# Patient Record
Sex: Female | Born: 1980 | Race: Black or African American | Hispanic: No | Smoking: Never smoker
Health system: Southern US, Community
[De-identification: ages and names within clinical notes are randomized; demographics above are authoritative.]

## PROBLEM LIST (undated history)

## (undated) DIAGNOSIS — F419 Anxiety disorder, unspecified: Secondary | ICD-10-CM

## (undated) DIAGNOSIS — M1990 Unspecified osteoarthritis, unspecified site: Secondary | ICD-10-CM

## (undated) DIAGNOSIS — T7840XA Allergy, unspecified, initial encounter: Secondary | ICD-10-CM

## (undated) DIAGNOSIS — F32A Depression, unspecified: Secondary | ICD-10-CM

## (undated) DIAGNOSIS — R5383 Other fatigue: Secondary | ICD-10-CM

## (undated) DIAGNOSIS — J302 Other seasonal allergic rhinitis: Secondary | ICD-10-CM

## (undated) DIAGNOSIS — I10 Essential (primary) hypertension: Secondary | ICD-10-CM

## (undated) DIAGNOSIS — R002 Palpitations: Secondary | ICD-10-CM

## (undated) DIAGNOSIS — R7303 Prediabetes: Secondary | ICD-10-CM

## (undated) DIAGNOSIS — M2550 Pain in unspecified joint: Secondary | ICD-10-CM

## (undated) DIAGNOSIS — E559 Vitamin D deficiency, unspecified: Secondary | ICD-10-CM

## (undated) DIAGNOSIS — M797 Fibromyalgia: Secondary | ICD-10-CM

## (undated) DIAGNOSIS — K5900 Constipation, unspecified: Secondary | ICD-10-CM

## (undated) DIAGNOSIS — M7989 Other specified soft tissue disorders: Secondary | ICD-10-CM

## (undated) DIAGNOSIS — R0602 Shortness of breath: Secondary | ICD-10-CM

## (undated) DIAGNOSIS — J45909 Unspecified asthma, uncomplicated: Secondary | ICD-10-CM

## (undated) DIAGNOSIS — Z91018 Allergy to other foods: Secondary | ICD-10-CM

## (undated) HISTORY — DX: Pain in unspecified joint: M25.50

## (undated) HISTORY — DX: Shortness of breath: R06.02

## (undated) HISTORY — DX: Other specified soft tissue disorders: M79.89

## (undated) HISTORY — DX: Essential (primary) hypertension: I10

## (undated) HISTORY — DX: Vitamin D deficiency, unspecified: E55.9

## (undated) HISTORY — DX: Constipation, unspecified: K59.00

## (undated) HISTORY — DX: Unspecified asthma, uncomplicated: J45.909

## (undated) HISTORY — DX: Palpitations: R00.2

## (undated) HISTORY — DX: Allergy to other foods: Z91.018

## (undated) HISTORY — DX: Other seasonal allergic rhinitis: J30.2

## (undated) HISTORY — DX: Prediabetes: R73.03

## (undated) HISTORY — DX: Anxiety disorder, unspecified: F41.9

## (undated) HISTORY — DX: Other fatigue: R53.83

## (undated) HISTORY — DX: Allergy, unspecified, initial encounter: T78.40XA

## (undated) HISTORY — DX: Unspecified osteoarthritis, unspecified site: M19.90

## (undated) HISTORY — DX: Fibromyalgia: M79.7

## (undated) HISTORY — DX: Depression, unspecified: F32.A

---

## 2003-09-03 ENCOUNTER — Encounter: Payer: Self-pay | Admitting: Emergency Medicine

## 2003-09-03 ENCOUNTER — Emergency Department (HOSPITAL_COMMUNITY): Admission: EM | Admit: 2003-09-03 | Discharge: 2003-09-03 | Payer: Self-pay | Admitting: Physical Therapy

## 2004-02-27 ENCOUNTER — Emergency Department (HOSPITAL_COMMUNITY): Admission: EM | Admit: 2004-02-27 | Discharge: 2004-02-27 | Payer: Self-pay | Admitting: Emergency Medicine

## 2005-11-19 DIAGNOSIS — M797 Fibromyalgia: Secondary | ICD-10-CM

## 2005-11-19 HISTORY — DX: Fibromyalgia: M79.7

## 2009-07-31 ENCOUNTER — Emergency Department (HOSPITAL_COMMUNITY): Admission: EM | Admit: 2009-07-31 | Discharge: 2009-07-31 | Payer: Self-pay | Admitting: Emergency Medicine

## 2011-02-23 LAB — URINALYSIS, ROUTINE W REFLEX MICROSCOPIC
Glucose, UA: NEGATIVE mg/dL
Hgb urine dipstick: NEGATIVE
Ketones, ur: 15 mg/dL — AB
Nitrite: NEGATIVE
Protein, ur: NEGATIVE mg/dL
Specific Gravity, Urine: >1.03 — ABNORMAL HIGH (ref 1.005–1.030)
Urobilinogen, UA: 0.2 mg/dL (ref 0.0–1.0)
pH: 5.5 (ref 5.0–8.0)

## 2011-02-23 LAB — BASIC METABOLIC PANEL
BUN: 10 mg/dL (ref 6–23)
CO2: 27 mEq/L (ref 19–32)
Calcium: 8.6 mg/dL (ref 8.4–10.5)
Chloride: 102 mEq/L (ref 96–112)
Creatinine, Ser: 0.82 mg/dL (ref 0.4–1.2)
GFR calc Af Amer: >60 mL/min (ref >60)
GFR calc non Af Amer: >60 mL/min (ref >60)
Glucose, Bld: 115 mg/dL — ABNORMAL HIGH (ref 70–99)
Potassium: 3.8 mEq/L (ref 3.5–5.1)
Sodium: 136 mEq/L (ref 135–145)

## 2011-02-23 LAB — DIFFERENTIAL
Basophils Absolute: 0 10*3/uL (ref 0.0–0.1)
Basophils Relative: 0 % (ref 0–1)
Eosinophils Absolute: 0 10*3/uL (ref 0.0–0.7)
Monocytes Absolute: 0.4 10*3/uL (ref 0.1–1.0)
Monocytes Relative: 7 % (ref 3–12)
Neutrophils Relative %: 72 % (ref 43–77)

## 2011-02-23 LAB — CBC
MCHC: 34 g/dL (ref 30.0–36.0)
MCV: 92 fL (ref 78.0–100.0)
RBC: 4.42 MIL/uL (ref 3.87–5.11)
RDW: 13.5 % (ref 11.5–15.5)

## 2011-02-23 LAB — PREGNANCY, URINE: Preg Test, Ur: NEGATIVE

## 2013-12-28 ENCOUNTER — Encounter: Payer: Self-pay | Admitting: *Deleted

## 2014-01-13 ENCOUNTER — Encounter: Payer: Self-pay | Admitting: Family Medicine

## 2014-01-13 ENCOUNTER — Ambulatory Visit (INDEPENDENT_AMBULATORY_CARE_PROVIDER_SITE_OTHER): Payer: BC Managed Care – PPO | Admitting: Family Medicine

## 2014-01-13 VITALS — BP 136/82 | HR 66 | Temp 98.2°F | Resp 20 | Ht 67.25 in | Wt 319.0 lb

## 2014-01-13 DIAGNOSIS — I10 Essential (primary) hypertension: Secondary | ICD-10-CM | POA: Insufficient documentation

## 2014-01-13 DIAGNOSIS — Z1321 Encounter for screening for nutritional disorder: Secondary | ICD-10-CM

## 2014-01-13 DIAGNOSIS — Z130 Encounter for screening for diseases of the blood and blood-forming organs and certain disorders involving the immune mechanism: Secondary | ICD-10-CM

## 2014-01-13 DIAGNOSIS — E669 Obesity, unspecified: Secondary | ICD-10-CM

## 2014-01-13 DIAGNOSIS — Z13228 Encounter for screening for other metabolic disorders: Secondary | ICD-10-CM

## 2014-01-13 DIAGNOSIS — M25561 Pain in right knee: Secondary | ICD-10-CM

## 2014-01-13 DIAGNOSIS — M25569 Pain in unspecified knee: Secondary | ICD-10-CM

## 2014-01-13 DIAGNOSIS — M797 Fibromyalgia: Secondary | ICD-10-CM | POA: Insufficient documentation

## 2014-01-13 DIAGNOSIS — R7303 Prediabetes: Secondary | ICD-10-CM | POA: Insufficient documentation

## 2014-01-13 DIAGNOSIS — M25572 Pain in left ankle and joints of left foot: Secondary | ICD-10-CM

## 2014-01-13 DIAGNOSIS — M25579 Pain in unspecified ankle and joints of unspecified foot: Secondary | ICD-10-CM

## 2014-01-13 DIAGNOSIS — G8929 Other chronic pain: Secondary | ICD-10-CM | POA: Insufficient documentation

## 2014-01-13 DIAGNOSIS — Z1329 Encounter for screening for other suspected endocrine disorder: Secondary | ICD-10-CM

## 2014-01-13 DIAGNOSIS — G43909 Migraine, unspecified, not intractable, without status migrainosus: Secondary | ICD-10-CM

## 2014-01-13 DIAGNOSIS — IMO0001 Reserved for inherently not codable concepts without codable children: Secondary | ICD-10-CM

## 2014-01-13 DIAGNOSIS — R7309 Other abnormal glucose: Secondary | ICD-10-CM

## 2014-01-13 LAB — LIPID PANEL
CHOLESTEROL: 116 mg/dL (ref 0–200)
HDL: 48 mg/dL (ref >39)
LDL Cholesterol: 61 mg/dL (ref 0–99)
TRIGLYCERIDES: 35 mg/dL (ref <150)
Total CHOL/HDL Ratio: 2.4 Ratio
VLDL: 7 mg/dL (ref 0–40)

## 2014-01-13 LAB — COMPREHENSIVE METABOLIC PANEL
ALBUMIN: 3.7 g/dL (ref 3.5–5.2)
ALT: 10 U/L (ref 0–35)
AST: 13 U/L (ref 0–37)
Alkaline Phosphatase: 71 U/L (ref 39–117)
BILIRUBIN TOTAL: 0.5 mg/dL (ref 0.2–1.2)
BUN: 13 mg/dL (ref 6–23)
CALCIUM: 8.8 mg/dL (ref 8.4–10.5)
CHLORIDE: 103 meq/L (ref 96–112)
CO2: 28 meq/L (ref 19–32)
Creat: 0.83 mg/dL (ref 0.50–1.10)
GLUCOSE: 96 mg/dL (ref 70–99)
Potassium: 4.5 mEq/L (ref 3.5–5.3)
SODIUM: 138 meq/L (ref 135–145)
TOTAL PROTEIN: 7.1 g/dL (ref 6.0–8.3)

## 2014-01-13 LAB — CBC WITH DIFFERENTIAL/PLATELET
Basophils Absolute: 0 10*3/uL (ref 0.0–0.1)
Basophils Relative: 0 % (ref 0–1)
EOS PCT: 1 % (ref 0–5)
Eosinophils Absolute: 0 10*3/uL (ref 0.0–0.7)
HEMATOCRIT: 39.8 % (ref 36.0–46.0)
HEMOGLOBIN: 13.5 g/dL (ref 12.0–15.0)
LYMPHS ABS: 1.3 10*3/uL (ref 0.7–4.0)
LYMPHS PCT: 29 % (ref 12–46)
MCH: 29.9 pg (ref 26.0–34.0)
MCHC: 33.9 g/dL (ref 30.0–36.0)
MCV: 88.2 fL (ref 78.0–100.0)
MONO ABS: 0.4 10*3/uL (ref 0.1–1.0)
Monocytes Relative: 8 % (ref 3–12)
NEUTROS ABS: 2.7 10*3/uL (ref 1.7–7.7)
Neutrophils Relative %: 62 % (ref 43–77)
Platelets: 313 10*3/uL (ref 150–400)
RBC: 4.51 MIL/uL (ref 3.87–5.11)
RDW: 14.4 % (ref 11.5–15.5)
WBC: 4.4 10*3/uL (ref 4.0–10.5)

## 2014-01-13 LAB — TSH: TSH: 1.19 u[IU]/mL (ref 0.350–4.500)

## 2014-01-13 LAB — HEMOGLOBIN A1C
Hgb A1c MFr Bld: 5.5 % (ref <5.7)
MEAN PLASMA GLUCOSE: 111 mg/dL (ref <117)

## 2014-01-13 MED ORDER — TRAMADOL HCL 50 MG PO TABS: 50.0000 mg | ORAL_TABLET | Freq: Four times a day (QID) | ORAL | Status: DC | PRN

## 2014-01-13 MED ORDER — TOPIRAMATE 25 MG PO TABS: 50.0000 mg | ORAL_TABLET | Freq: Every day | ORAL | Status: DC

## 2014-01-13 MED ORDER — BENZONATATE 100 MG PO CAPS: 100.0000 mg | ORAL_CAPSULE | Freq: Three times a day (TID) | ORAL | Status: DC | PRN

## 2014-01-13 MED ORDER — ALBUTEROL SULFATE HFA 108 (90 BASE) MCG/ACT IN AERS: 2.0000 | INHALATION_SPRAY | RESPIRATORY_TRACT | Status: DC | PRN

## 2014-01-13 NOTE — Progress Notes (Signed)
Patient ID: Cynthia [name], female   DOB: 11-20-1980, 33 y.o.   MRN: [id number]     Subjective:    Patient ID: Cynthia [name], female    DOB: 11-20-1980, 33 y.o.   MRN: [id number]  Patient presents for Complete physical, frequent headaches and leg pain New patient to establish care,[facility]- Dr. Mliss FritzWhapshire? She has multiple concerns today. Her medications and history were reviewed  Htn-history of hypertension she was last on blood pressure medications 2 years ago. She's also borderline diabetic per report. She's never been on medications for this.  Athma- cough variant asthma she did have albuterol inhaler but did not have a prescription should she lost her insurance. She does not have daily symptoms. Knee/ankle pain-right knee a left ankle pain on and off. She was evaluated by her last PCP for the right knee pain which started in January she did not have any fall or injury. She did have some mild swelling and has been taking Aleve and Tylenol. She was told that she had a meniscus tear by her PCP but no imaging was done. She does state on her feet many hours during the day and she works at a [employer]. She's also had left ankle pain on and off for the past couple years she did sprain it severely a couple years ago but continues to have pain and swelling in the ankle. Fibromyalgia-diagnosed with fibromyalgia in 2007. She was initially on tramadol Flexeril and ibuprofen she did see a rheumatologist as well. She was placed on Lyrica 2012 which helped however she lost her insurance he came off the medication. She's been trying to manage her fibromyalgia on her own with diet and activity  Migraines on and off daily for the past 3-4 months. It starts out with a pounding sensation in her tears and radiates across her forehead. She does have nausea and occasional phonophobia associated. She has been taking Excedrin most days as well as drinking more caffeinated  beverages  She's not had a Pap smear in 2-3 years    Review Of Systems:  GEN- denies fatigue, fever, weight loss,weakness, recent illness HEENT- denies eye drainage, change in vision, nasal discharge, CVS- denies chest pain, palpitations RESP- denies SOB, cough, wheeze ABD- denies N/V, change in stools, abd pain GU- denies dysuria, hematuria, dribbling, incontinence MSK- + joint pain, muscle aches, injury Neuro- +headache, dizziness, syncope, seizure activity       Objective:    BP 136/82  Pulse 66  Temp(Src) 98.2 F (36.8 C)  Resp 20  Ht 5' 7.25" (1.708 m)  Wt 319 lb (144.697 kg)  BMI 49.60 kg/m2  LMP 01/13/2014 GEN- NAD, alert and oriented x3, obese HEENT- PERRL, EOMI, non injected sclera, pink conjunctiva, MMM, oropharynx clear, TM clear bilat Neck- Supple, no thyromegaly CVS- RRR, no murmur RESP-CTAB ABD-NABS,soft,NT,ND MSK- Right knee- TTP inferior joint line, no effusion, large knees bilat, no warmth, good ROM, left ankle, no swelling, normal appearnce, ligaments in tact Neuro- CNII-XII in tact EXT- No edema Pulses- Radial, DP- 2+ Psych- normal affect and mood       Assessment & Plan:      Problem List Items Addressed This Visit   Right knee pain     This is likely do to her habitus. She needs to work on exercise and weight loss. I will obtain a plain film of her knee, she's been given tramadol to use for severe pain for now    Relevant Orders  DG Knee Complete 4 Views Right   Obesity, unspecified     Discussed importance of healthy eating and weight loss she is at risk for other comorbidities    Relevant Orders      Lipid panel (Completed)   Migraines     Start Topamax at bedtime plan we'll start with 25 mg and increase to 50 mg    Relevant Medications      aspirin-acetaminophen-caffeine (EXCEDRIN MIGRAINE) 250-250-65 MG per tablet      topiramate (TOPAMAX) tablet      traMADol (ULTRAM) tablet 50 mg   Fibromyalgia     For now we'll give  her tramadol per above     Essential hypertension, benign     We'll monitor her blood pressure    Relevant Orders      Comprehensive metabolic panel (Completed)      Lipid panel (Completed)      CBC with Differential (Completed)      TSH (Completed)   Borderline diabetes - Primary     Check A1c    Relevant Orders      Hemoglobin A1c (Completed)    Other Visit Diagnoses   Encounter for vitamin deficiency screening        Relevant Orders       Vitamin D, 25-hydroxy (Completed)    Left ankle pain        Relevant Orders       DG Ankle Complete Left       Note: This dictation was prepared with Dragon dictation along with smaller phrase technology. Any transcriptional errors that result from this process are unintentional.

## 2014-01-13 NOTE — Patient Instructions (Addendum)
Release of records- [facility] Release of records- [facility]  Release of records- Dr. Manon HildingBodea- [facility] Take 1 tablet of topamax at beditme then increase to 2 tablets We will call with lab results  Get the xrays done F/U in 5 weeks for Physical with PAP SMear

## 2014-01-14 LAB — VITAMIN D 25 HYDROXY (VIT D DEFICIENCY, FRACTURES): VIT D 25 HYDROXY: 17 ng/mL — AB (ref 30–89)

## 2014-01-15 DIAGNOSIS — G43909 Migraine, unspecified, not intractable, without status migrainosus: Secondary | ICD-10-CM | POA: Insufficient documentation

## 2014-01-15 NOTE — Assessment & Plan Note (Signed)
Discussed importance of healthy eating and weight loss she is at risk for other comorbidities

## 2014-01-15 NOTE — Assessment & Plan Note (Signed)
This is likely do to her habitus. She needs to work on exercise and weight loss. I will obtain a plain film of her knee, she's been given tramadol to use for severe pain for now

## 2014-01-15 NOTE — Assessment & Plan Note (Addendum)
We'll monitor her blood pressure

## 2014-01-15 NOTE — Assessment & Plan Note (Signed)
Start Topamax at bedtime plan we'll start with 25 mg and increase to 50 mg

## 2014-01-15 NOTE — Assessment & Plan Note (Signed)
Check A1c. 

## 2014-01-15 NOTE — Assessment & Plan Note (Signed)
For now we'll give her tramadol per above

## 2014-01-17 MED ORDER — VITAMIN D (ERGOCALCIFEROL) 1.25 MG (50000 UNIT) PO CAPS: 50000.0000 [IU] | ORAL_CAPSULE | ORAL | Status: DC

## 2014-01-17 NOTE — Addendum Note (Signed)
Addended by: Milinda AntisURHAM, Lucely Leard F on: 01/17/2014 08:01 PM   Modules accepted: Orders

## 2014-01-18 ENCOUNTER — Encounter: Payer: Self-pay | Admitting: *Deleted

## 2014-01-18 ENCOUNTER — Other Ambulatory Visit: Payer: Self-pay | Admitting: *Deleted

## 2014-01-18 MED ORDER — VITAMIN D (ERGOCALCIFEROL) 1.25 MG (50000 UNIT) PO CAPS: 50000.0000 [IU] | ORAL_CAPSULE | ORAL | Status: DC

## 2014-01-18 NOTE — Telephone Encounter (Signed)
Error occurred during prior e-scribe. Refill sent again.

## 2014-02-01 ENCOUNTER — Ambulatory Visit (HOSPITAL_COMMUNITY)
Admission: RE | Admit: 2014-02-01 | Discharge: 2014-02-01 | Disposition: A | Discharge status: Home / Self Care | Payer: BC Managed Care – PPO | Source: Ambulatory Visit | Attending: Family Medicine | Admitting: Family Medicine

## 2014-02-01 DIAGNOSIS — M25569 Pain in unspecified knee: Secondary | ICD-10-CM | POA: Insufficient documentation

## 2014-02-01 DIAGNOSIS — M25572 Pain in left ankle and joints of left foot: Secondary | ICD-10-CM

## 2014-02-01 DIAGNOSIS — M25579 Pain in unspecified ankle and joints of unspecified foot: Secondary | ICD-10-CM | POA: Insufficient documentation

## 2014-02-01 DIAGNOSIS — M25561 Pain in right knee: Secondary | ICD-10-CM

## 2014-02-04 ENCOUNTER — Telehealth: Payer: Self-pay | Admitting: *Deleted

## 2014-02-04 NOTE — Telephone Encounter (Signed)
Pt states she is having right knee pain and wants to know if she can get an inflammtory medication,still swollen but xrays shows normal.  Pharm[place]: [facility] [place] [address]

## 2014-02-05 MED ORDER — MELOXICAM 7.5 MG PO TABS: 7.5000 mg | ORAL_TABLET | Freq: Every day | ORAL | Status: DC

## 2014-02-05 NOTE — Telephone Encounter (Signed)
Send meloxicam 7.5mg  1 tab po daily  #30 R 2

## 2014-02-05 NOTE — Telephone Encounter (Signed)
Prescription sent to pharmacy. .   Call placed to patient and patient made aware.  

## 2014-02-22 ENCOUNTER — Ambulatory Visit (INDEPENDENT_AMBULATORY_CARE_PROVIDER_SITE_OTHER): Payer: BC Managed Care – PPO | Admitting: Family Medicine

## 2014-02-22 ENCOUNTER — Encounter: Payer: Self-pay | Admitting: Family Medicine

## 2014-02-22 VITALS — BP 138/72 | HR 86 | Temp 98.0°F | Resp 18 | Ht 66.0 in | Wt 313.0 lb

## 2014-02-22 DIAGNOSIS — G43909 Migraine, unspecified, not intractable, without status migrainosus: Secondary | ICD-10-CM

## 2014-02-22 DIAGNOSIS — M25561 Pain in right knee: Secondary | ICD-10-CM

## 2014-02-22 DIAGNOSIS — M25569 Pain in unspecified knee: Secondary | ICD-10-CM

## 2014-02-22 DIAGNOSIS — Z124 Encounter for screening for malignant neoplasm of cervix: Secondary | ICD-10-CM

## 2014-02-22 DIAGNOSIS — Z01419 Encounter for gynecological examination (general) (routine) without abnormal findings: Secondary | ICD-10-CM

## 2014-02-22 DIAGNOSIS — Z0000 Encounter for general adult medical examination without abnormal findings: Secondary | ICD-10-CM | POA: Insufficient documentation

## 2014-02-22 DIAGNOSIS — M25572 Pain in left ankle and joints of left foot: Secondary | ICD-10-CM

## 2014-02-22 DIAGNOSIS — E669 Obesity, unspecified: Secondary | ICD-10-CM

## 2014-02-22 DIAGNOSIS — M25579 Pain in unspecified ankle and joints of unspecified foot: Secondary | ICD-10-CM

## 2014-02-22 DIAGNOSIS — J329 Chronic sinusitis, unspecified: Secondary | ICD-10-CM

## 2014-02-22 MED ORDER — BENZONATATE 100 MG PO CAPS: 100.0000 mg | ORAL_CAPSULE | Freq: Three times a day (TID) | ORAL | Status: DC | PRN

## 2014-02-22 MED ORDER — MELOXICAM 7.5 MG PO TABS: 7.5000 mg | ORAL_TABLET | Freq: Every day | ORAL | Status: DC

## 2014-02-22 MED ORDER — AMOXICILLIN 500 MG PO CAPS: 500.0000 mg | ORAL_CAPSULE | Freq: Three times a day (TID) | ORAL | Status: DC

## 2014-02-22 MED ORDER — TRAMADOL HCL 50 MG PO TABS: 50.0000 mg | ORAL_TABLET | Freq: Four times a day (QID) | ORAL | Status: DC | PRN

## 2014-02-22 NOTE — Assessment & Plan Note (Signed)
Continued left ankle pain which is chronic. X-rays are benign but she continues to have pain and feels like her ankles rolling on the time. I've given her a prescription for a lace up stabilizing brace I will refer her to orthopedics for a second opinion.

## 2014-02-22 NOTE — Progress Notes (Signed)
Patient ID: Cynthia [name], female   DOB: 1981/07/13, 33 y.o.   MRN: [id number]   Subjective:    Patient ID: Cynthia [name], female    DOB: 1981/07/13, 33 y.o.   MRN: [id number]  Patient presents for CPE with PAP and sinus infection  patient here for complete physical exam with Pap smear. Her last Pap smear was about 2-3 years ago. Her last menstrual period was March 22 it was normal. She's never had any abnormal Pap smears. She denies any vaginal discharge   Her fasting labs were reviewed with her.  She complains of sinus pressure and drainage itchy scratchy throat coughing up yellow sputum for the past week. States that she's a history of sinus infections and this is when they typically start. She has been using Nasonex and Claritin-D with minimal improvement. She's not had any fever.   She also continues to have pain in her left ankle her x-ray was negative as well as x-ray of the right knee this is the ankle that she injured many years ago. She feels like the ligaments are very unstable and every time she gets up to walk around she feels like her ankles rolling.    Review Of Systems:  GEN- denies fatigue, fever, weight loss,weakness, recent illness HEENT- denies eye drainage, change in vision, nasal discharge, CVS- denies chest pain, palpitations RESP- denies SOB, cough, wheeze ABD- denies N/V, change in stools, abd pain GU- denies dysuria, hematuria, dribbling, incontinence MSK- + joint pain, muscle aches, injury Neuro- denies headache, dizziness, syncope, seizure activity       Objective:    BP 138/72  Pulse 86  Temp(Src) 98 F (36.7 C) (Oral)  Resp 18  Ht 5\' 6"  (1.676 m)  Wt 313 lb (141.976 kg)  BMI 50.54 kg/m2  LMP 02/07/2014 GEN- NAD, alert and oriented x3 HEENT- PERRL, EOMI, non injected sclera, pink conjunctiva, MMM, oropharynx clear, TM clear bilat Nares clear discharge, +mild maxillary sinus tenderness,  Neck- Supple, no LAD Breast- normal symmetry, no nipple  inversion,no nipple drainage, no nodules or lumps felt Nodes- no axillary nodes CVS- RRR, no murmur RESP-CTAB ABD-NABS,soft,NT,ND GU- normal external genitalia, vaginal mucosa pink and moist, cervix visualized no growth, no blood form os, no discharge, no CMT, no ovarian masses, uterus normal size MSK- left ankle normal appearance, ligaments in tact, Good ROM EXT- No edema Pulses- Radial, DP- 2+        Assessment & Plan:      Problem List Items Addressed This Visit   Left ankle pain    Other Visit Diagnoses   Screening for malignant neoplasm of the cervix    -  Primary    Relevant Orders       PAP, ThinPrep ASCUS Rflx HPV Rflx Type       Note: This dictation was prepared with Dragon dictation along with smaller phrase technology. Any transcriptional errors that result from this process are unintentional.

## 2014-02-22 NOTE — Patient Instructions (Signed)
Continue nasonex Try the zyrtec Use mucinex samples Start antibiotics if not better by Thursday or Friday We will send letter with PAP Smear results [place] [facility] F/U 3 months

## 2014-02-22 NOTE — Assessment & Plan Note (Signed)
Pap smear done. Tetanus is up-to-date

## 2014-02-22 NOTE — Assessment & Plan Note (Signed)
I think this is mostly an allergy component. Advised her try to see her take standing continue the Nasonex. Have given her prescription for amoxicillin she does not get better by the end of the week if he would bend over 10 days with symptoms she does have history of recurrent sinusitis

## 2014-02-22 NOTE — Assessment & Plan Note (Signed)
She has lost 6 pounds since her last visit she will continue on her dietary changes also the Topamax is contributing

## 2014-02-22 NOTE — Assessment & Plan Note (Signed)
Continue Topamax her migraines have improved

## 2014-02-23 LAB — PAP THINPREP ASCUS RFLX HPV RFLX TYPE

## 2014-02-24 ENCOUNTER — Encounter: Payer: Self-pay | Admitting: *Deleted

## 2014-05-24 ENCOUNTER — Ambulatory Visit: Payer: BC Managed Care – PPO | Admitting: Family Medicine

## 2014-06-21 ENCOUNTER — Ambulatory Visit: Payer: BC Managed Care – PPO | Admitting: Family Medicine

## 2016-02-15 ENCOUNTER — Ambulatory Visit: Payer: BLUE CROSS/BLUE SHIELD | Attending: Family Medicine | Admitting: Family Medicine

## 2016-02-15 ENCOUNTER — Encounter: Payer: Self-pay | Admitting: Family Medicine

## 2016-02-15 VITALS — BP 136/91 | HR 83 | Temp 97.7°F | Resp 18 | Ht 66.0 in | Wt 313.4 lb

## 2016-02-15 DIAGNOSIS — H9209 Otalgia, unspecified ear: Secondary | ICD-10-CM | POA: Diagnosis not present

## 2016-02-15 DIAGNOSIS — M797 Fibromyalgia: Secondary | ICD-10-CM | POA: Insufficient documentation

## 2016-02-15 DIAGNOSIS — R51 Headache: Secondary | ICD-10-CM | POA: Insufficient documentation

## 2016-02-15 DIAGNOSIS — J302 Other seasonal allergic rhinitis: Secondary | ICD-10-CM

## 2016-02-15 DIAGNOSIS — Z79899 Other long term (current) drug therapy: Secondary | ICD-10-CM | POA: Insufficient documentation

## 2016-02-15 DIAGNOSIS — E669 Obesity, unspecified: Secondary | ICD-10-CM | POA: Diagnosis not present

## 2016-02-15 DIAGNOSIS — G43809 Other migraine, not intractable, without status migrainosus: Secondary | ICD-10-CM

## 2016-02-15 DIAGNOSIS — G43909 Migraine, unspecified, not intractable, without status migrainosus: Secondary | ICD-10-CM | POA: Diagnosis present

## 2016-02-15 DIAGNOSIS — I10 Essential (primary) hypertension: Secondary | ICD-10-CM | POA: Insufficient documentation

## 2016-02-15 DIAGNOSIS — G4700 Insomnia, unspecified: Secondary | ICD-10-CM | POA: Diagnosis present

## 2016-02-15 MED ORDER — LORATADINE 10 MG PO TABS: 10.0000 mg | ORAL_TABLET | Freq: Every day | ORAL | Status: DC

## 2016-02-15 MED ORDER — TOPIRAMATE 50 MG PO TABS: 50.0000 mg | ORAL_TABLET | Freq: Two times a day (BID) | ORAL | Status: DC

## 2016-02-15 MED ORDER — CYCLOBENZAPRINE HCL 10 MG PO TABS: 10.0000 mg | ORAL_TABLET | Freq: Three times a day (TID) | ORAL | Status: DC | PRN

## 2016-02-15 MED ORDER — MOMETASONE FUROATE 50 MCG/ACT NA SUSP: 2.0000 | Freq: Every day | NASAL | Status: DC

## 2016-02-15 MED ORDER — TRAMADOL HCL 50 MG PO TABS: 50.0000 mg | ORAL_TABLET | Freq: Four times a day (QID) | ORAL | Status: DC | PRN

## 2016-02-15 NOTE — Progress Notes (Signed)
Patient's here to est. care PCP, and wellness check.  Patient states she's dealing with HA's and ear pain.  Patient having issues with sleeping.  Patient requesting refill of Tramadol, Benzonatate.

## 2016-02-15 NOTE — Progress Notes (Signed)
Subjective:  Patient ID: Cynthia [name], female    DOB: 03-21-1981  Age: 35 y.o. MRN: [id number]  CC: Migraine; Fibromyalgia; and Insomnia   HPI Cynthia Jumboamieka Hartin is a 35 year old female with a history of migraines, fibromyalgia, hypertension who presents today to establish care and has not been under the care of a physician for a while.  She was previously on Topamax for migraines but has not used this of recent; migraines occur about once a month. Was also on Lyrica for fibromyalgia but does not take any medications at this time and complains of pain in her upper and lower back.  Complains of insomnia and has to take Zyquil which does not help her stay asleep. She is also concerned about a 75 pound weight gain in the last 8 months despite walking for exercise and watching what she eats; she thinks "she is retaining fluid"as she does not urinate and goes on to inform me that regardless of how much she drinks she only urinates twice a day. She suffers from seasonal allergies with resulting postnasal drip, earache and headache 6 and has not been on any medications recently.  Outpatient Prescriptions Prior to Visit  Medication Sig Dispense Refill  . albuterol (PROVENTIL HFA;VENTOLIN HFA) 108 (90 BASE) MCG/ACT inhaler Inhale 2 puffs into the lungs every 4 (four) hours as needed for wheezing or shortness of breath. 1 Inhaler 3  . benzonatate (TESSALON) 100 MG capsule Take 1 capsule (100 mg total) by mouth 3 (three) times daily as needed for cough. 20 capsule 1  . DiphenhydrAMINE HCl, Sleep, (ZZZQUIL) 25 MG CAPS Take 3 capsules by mouth at bedtime as needed.    . meloxicam (MOBIC) 7.5 MG tablet Take 1 tablet (7.5 mg total) by mouth daily. 30 tablet 1  . mometasone (NASONEX) 50 MCG/ACT nasal spray Place 2 sprays into the nose daily.    Marland Kitchen. topiramate (TOPAMAX) 25 MG tablet Take 2 tablets (50 mg total) by mouth at bedtime. 60 tablet 3  . traMADol (ULTRAM) 50 MG tablet Take 1 tablet (50 mg total) by  mouth every 6 (six) hours as needed. 30 tablet 1  . Vitamin D, Ergocalciferol, (DRISDOL) 50000 UNITS CAPS capsule Take 1 capsule (50,000 Units total) by mouth every 7 (seven) days. (Patient not taking: Reported on 02/15/2016) 4 capsule 2  . amoxicillin (AMOXIL) 500 MG capsule Take 1 capsule (500 mg total) by mouth 3 (three) times daily. (Patient not taking: Reported on 02/15/2016) 30 capsule 0   No facility-administered medications prior to visit.    ROS Review of Systems  Constitutional: Negative for activity change, appetite change and fatigue.  HENT: Positive for ear pain. Negative for congestion, sinus pressure and sore throat.   Eyes: Negative for visual disturbance.  Respiratory: Negative for cough, chest tightness, shortness of breath and wheezing.   Cardiovascular: Negative for chest pain and palpitations.  Gastrointestinal: Negative for abdominal pain, constipation and abdominal distention.  Endocrine: Negative for polydipsia.  Genitourinary: Negative for dysuria and frequency.  Musculoskeletal: Positive for myalgias. Negative for back pain and arthralgias.  Skin: Negative for rash.  Neurological: Positive for headaches. Negative for tremors, light-headedness and numbness.  Hematological: Does not bruise/bleed easily.  Psychiatric/Behavioral: Positive for sleep disturbance. Negative for behavioral problems and agitation.    Objective:  BP 136/91 mmHg  Pulse 83  Temp(Src) 97.7 F (36.5 C) (Oral)  Resp 18  Ht 5\' 6"  (1.676 m)  Wt 313 lb 6.4 oz (142.157 kg)  BMI 50.61 kg/m2  SpO2  98%  LMP 02/09/2016  BP/Weight 02/15/2016 02/22/2014 01/13/2014  Systolic BP 136 138 136  Diastolic BP 91 72 82  Wt. (Lbs) 313.4 313 319  BMI 50.61 50.54 49.6      Physical Exam  Constitutional: She is oriented to person, place, and time. She appears well-developed and well-nourished.  Obese  Cardiovascular: Normal rate, normal heart sounds and intact distal pulses.   No murmur  heard. Pulmonary/Chest: Effort normal and breath sounds normal. She has no wheezes. She has no rales. She exhibits no tenderness.  Abdominal: Soft. Bowel sounds are normal. She exhibits no distension and no mass. There is no tenderness.  Musculoskeletal: Normal range of motion.  Neurological: She is alert and oriented to person, place, and time.  Skin: Skin is warm and dry.  Psychiatric: She has a normal mood and affect.     Assessment & Plan:   1. Obesity, unspecified Discussed exercise regimen of 30 minutes 5 times a week and calorie restriction - TSH; Future  2. Essential hypertension, benign Diastolic elevation. Lifestyle modification at this time - COMPLETE METABOLIC PANEL WITH GFR; Future - Lipid panel; Future  3. Other type of migraine Episodes and frequent but given Topamax helps with weight loss I will restart it. - topiramate (TOPAMAX) 50 MG tablet; Take 1 tablet (50 mg total) by mouth 2 (two) times daily.  Dispense: 60 tablet; Refill: 2  4. Fibromyalgia Discussed aquatic therapy and other modalities to improve symptoms - traMADol (ULTRAM) 50 MG tablet; Take 1 tablet (50 mg total) by mouth every 6 (six) hours as needed.  Dispense: 30 tablet; Refill: 1 - cyclobenzaprine (FLEXERIL) 10 MG tablet; Take 1 tablet (10 mg total) by mouth 3 (three) times daily as needed for muscle spasms.  Dispense: 60 tablet; Refill: 1  5. Seasonal allergies - mometasone (NASONEX) 50 MCG/ACT nasal spray; Place 2 sprays into the nose daily.  Dispense: 17 g; Refill: 2  6. Insomnia Cannot exclude sleep apnea at this time. At next office visit I will refer her for sleep study.   Meds ordered this encounter  Medications  . traMADol (ULTRAM) 50 MG tablet    Sig: Take 1 tablet (50 mg total) by mouth every 6 (six) hours as needed.    Dispense:  30 tablet    Refill:  1  . topiramate (TOPAMAX) 50 MG tablet    Sig: Take 1 tablet (50 mg total) by mouth 2 (two) times daily.    Dispense:  60 tablet     Refill:  2  . mometasone (NASONEX) 50 MCG/ACT nasal spray    Sig: Place 2 sprays into the nose daily.    Dispense:  17 g    Refill:  2  . cyclobenzaprine (FLEXERIL) 10 MG tablet    Sig: Take 1 tablet (10 mg total) by mouth 3 (three) times daily as needed for muscle spasms.    Dispense:  60 tablet    Refill:  1    Follow-up: Return in about 3 weeks (around 03/07/2016) for follow up on insomnia.   Jaclyn Shaggy MD

## 2016-02-16 ENCOUNTER — Encounter: Payer: Self-pay | Admitting: Clinical

## 2016-02-16 NOTE — Progress Notes (Signed)
Depression screen [facility] 2/9 02/15/2016  Decreased Interest 0  Down, Depressed, Hopeless 1  PHQ - 2 Score 1  Altered sleeping 3  Tired, decreased energy 2  Change in appetite 0  Feeling bad or failure about yourself  0  Trouble concentrating 0  Moving slowly or fidgety/restless 0  Suicidal thoughts 0  PHQ-9 Score 6    GAD 7 : Generalized Anxiety Score 02/15/2016  Nervous, Anxious, on Edge 1  Control/stop worrying 1  Worry too much - different things 1  Trouble relaxing 1  Restless 0  Easily annoyed or irritable 1  Afraid - awful might happen 1  Total GAD 7 Score 6

## 2016-02-17 ENCOUNTER — Other Ambulatory Visit: Payer: BLUE CROSS/BLUE SHIELD

## 2016-02-22 ENCOUNTER — Ambulatory Visit: Payer: BLUE CROSS/BLUE SHIELD | Attending: Family Medicine

## 2016-02-22 DIAGNOSIS — E669 Obesity, unspecified: Secondary | ICD-10-CM | POA: Diagnosis present

## 2016-02-22 DIAGNOSIS — I10 Essential (primary) hypertension: Secondary | ICD-10-CM | POA: Diagnosis present

## 2016-02-23 LAB — COMPLETE METABOLIC PANEL WITH GFR
ALT: 13 U/L (ref 6–29)
AST: 14 U/L (ref 10–30)
Albumin: 3.2 g/dL — ABNORMAL LOW (ref 3.6–5.1)
Alkaline Phosphatase: 62 U/L (ref 33–115)
BUN: 12 mg/dL (ref 7–25)
CHLORIDE: 102 mmol/L (ref 98–110)
CO2: 27 mmol/L (ref 20–31)
Calcium: 8.5 mg/dL — ABNORMAL LOW (ref 8.6–10.2)
Creat: 0.79 mg/dL (ref 0.50–1.10)
GFR, Est African American: >89 mL/min (ref >=60)
GFR, Est Non African American: >89 mL/min (ref >=60)
GLUCOSE: 80 mg/dL (ref 65–99)
POTASSIUM: 4.3 mmol/L (ref 3.5–5.3)
SODIUM: 137 mmol/L (ref 135–146)
Total Bilirubin: 0.4 mg/dL (ref 0.2–1.2)
Total Protein: 6.6 g/dL (ref 6.1–8.1)

## 2016-02-23 LAB — LIPID PANEL
CHOL/HDL RATIO: 2.6 ratio (ref <=5.0)
Cholesterol: 107 mg/dL — ABNORMAL LOW (ref 125–200)
HDL: 41 mg/dL — ABNORMAL LOW (ref >=46)
LDL CALC: 58 mg/dL (ref <130)
Triglycerides: 38 mg/dL (ref <150)
VLDL: 8 mg/dL (ref <30)

## 2016-02-23 LAB — TSH: TSH: 1.79 m[IU]/L

## 2016-02-29 ENCOUNTER — Ambulatory Visit: Payer: BLUE CROSS/BLUE SHIELD | Admitting: Family Medicine

## 2016-03-06 ENCOUNTER — Telehealth: Payer: Self-pay | Admitting: Family Medicine

## 2016-03-06 NOTE — Telephone Encounter (Signed)
Pt. Called requesting her lab results that she had on 02/22/16. Please f/u

## 2016-03-09 ENCOUNTER — Encounter: Payer: Self-pay | Admitting: Clinical

## 2016-03-09 ENCOUNTER — Ambulatory Visit: Payer: BLUE CROSS/BLUE SHIELD | Attending: Family Medicine | Admitting: Family Medicine

## 2016-03-09 ENCOUNTER — Encounter: Payer: Self-pay | Admitting: Family Medicine

## 2016-03-09 VITALS — BP 114/77 | HR 92 | Temp 98.1°F | Resp 16 | Ht 66.0 in | Wt 312.8 lb

## 2016-03-09 DIAGNOSIS — M5432 Sciatica, left side: Secondary | ICD-10-CM | POA: Diagnosis not present

## 2016-03-09 DIAGNOSIS — Z79899 Other long term (current) drug therapy: Secondary | ICD-10-CM | POA: Insufficient documentation

## 2016-03-09 DIAGNOSIS — Z6841 Body Mass Index (BMI) 40.0 and over, adult: Secondary | ICD-10-CM | POA: Diagnosis not present

## 2016-03-09 DIAGNOSIS — M545 Low back pain: Secondary | ICD-10-CM | POA: Diagnosis not present

## 2016-03-09 DIAGNOSIS — M5442 Lumbago with sciatica, left side: Secondary | ICD-10-CM | POA: Diagnosis not present

## 2016-03-09 DIAGNOSIS — R400 Somnolence: Secondary | ICD-10-CM | POA: Diagnosis not present

## 2016-03-09 DIAGNOSIS — E669 Obesity, unspecified: Secondary | ICD-10-CM | POA: Insufficient documentation

## 2016-03-09 DIAGNOSIS — M797 Fibromyalgia: Secondary | ICD-10-CM | POA: Insufficient documentation

## 2016-03-09 DIAGNOSIS — G4700 Insomnia, unspecified: Secondary | ICD-10-CM | POA: Diagnosis not present

## 2016-03-09 DIAGNOSIS — J302 Other seasonal allergic rhinitis: Secondary | ICD-10-CM

## 2016-03-09 NOTE — Progress Notes (Signed)
Patient's here for f/up insomnia. Patient's having back pain with spasms x2.5wks. Pain radiates down legs.  Patient c/o sore throat and dry mouth x1wk.  Patient requesting med refill.

## 2016-03-09 NOTE — Progress Notes (Signed)
Subjective:  Patient ID: Cynthia [name], female    DOB: 09-Dec-1980  Age: [age] y.o. MRN: [id number]  CC: Follow-up and Insomnia   HPI Cynthia Jumboamieka Eskew presents for follow-up on insomnia and complains that she still has a lot of midnight awakenings and has daytime somnolence. She also feels tired during the day. Labs reviewed with the patient : TSH , complete metabolic panel came back normal and so was her cholesterol; advised to increase physical activity to raise HDL level which was on the low side.  She complains of a dry mouth and sore throat for 1 week which she describes as throat burning whenever she drinks lemonade. Denies fever, sinus pressure, headache. Has been compliant with her Flonase and antihistamine.  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 Pain sometimes radiates down the sides of both legs but she denies numbness in lower extremity. Currently taking tramadol and Flexeril which she received for her fibromyalgia  Outpatient Prescriptions Prior to Visit  Medication Sig Dispense Refill  . albuterol (PROVENTIL HFA;VENTOLIN HFA) 108 (90 BASE) MCG/ACT inhaler Inhale 2 puffs into the lungs every 4 (four) hours as needed for wheezing or shortness of breath. 1 Inhaler 3  . cyclobenzaprine (FLEXERIL) 10 MG tablet Take 1 tablet (10 mg total) by mouth 3 (three) times daily as needed for muscle spasms. 60 tablet 1  . DiphenhydrAMINE HCl, Sleep, (ZZZQUIL) 25 MG CAPS Take 3 capsules by mouth at bedtime as needed.    . loratadine (CLARITIN) 10 MG tablet Take 1 tablet (10 mg total) by mouth daily. 30 tablet 2  . mometasone (NASONEX) 50 MCG/ACT nasal spray Place 2 sprays into the nose daily. 17 g 2  . topiramate (TOPAMAX) 50 MG tablet Take 1 tablet (50 mg total) by mouth 2 (two) times daily. 60 tablet 2  . traMADol (ULTRAM) 50 MG tablet Take 1 tablet (50 mg total) by mouth every 6 (six) hours as needed. 30 tablet 1    . Vitamin D, Ergocalciferol, (DRISDOL) 50000 UNITS CAPS capsule Take 1 capsule (50,000 Units total) by mouth every 7 (seven) days. 4 capsule 2  . benzonatate (TESSALON) 100 MG capsule Take 1 capsule (100 mg total) by mouth 3 (three) times daily as needed for cough. (Patient not taking: Reported on 03/09/2016) 20 capsule 1  . meloxicam (MOBIC) 7.5 MG tablet Take 1 tablet (7.5 mg total) by mouth daily. (Patient not taking: Reported on 03/09/2016) 30 tablet 1   No facility-administered medications prior to visit.    ROS Review of Systems  Constitutional: Negative for activity change, appetite change and fatigue.  HENT: Negative for congestion, sinus pressure and sore throat.        See history of present illness  Eyes: Negative for visual disturbance.  Respiratory: Negative for cough, chest tightness, shortness of breath and wheezing.   Cardiovascular: Negative for chest pain and palpitations.  Gastrointestinal: Negative for abdominal pain, constipation and abdominal distention.  Endocrine: Negative for polydipsia.  Genitourinary: Negative.  Negative for dysuria and frequency.  Musculoskeletal: Positive for back pain. Negative for arthralgias.  Skin: Negative for rash.  Neurological: Negative for tremors, light-headedness and numbness.  Hematological: Does not bruise/bleed easily.  Psychiatric/Behavioral: Negative for behavioral problems, dysphoric mood and agitation.    Objective:  BP 114/77 mmHg  Pulse 92  Temp(Src) 98.1 F (36.7 C) (Oral)  Resp 16  Ht 5\' 6"  (1.676 m)  Wt 312 lb 12.8 oz (141.885 kg)  BMI 50.51 kg/m2  SpO2 96%  LMP 02/09/2016  BP/Weight 03/09/2016 02/15/2016 02/22/2014  Systolic BP 114 136 138  Diastolic BP 77 91 72  Wt. (Lbs) 312.8 313.4 313  BMI 50.51 50.61 50.54      Physical Exam  Constitutional: She is oriented to person, place, and time. She appears well-developed and well-nourished.  Morbidly obese  HENT:  Head: Normocephalic.  Right Ear: External  ear normal.  Left Ear: External ear normal.  Mouth/Throat: Oropharynx is clear and moist. No oropharyngeal exudate.  Cardiovascular: Normal rate, normal heart sounds and intact distal pulses.   No murmur heard. Pulmonary/Chest: Effort normal and breath sounds normal. She has no wheezes. She has no rales. She exhibits no tenderness.  Abdominal: Soft. Bowel sounds are normal. She exhibits no distension and no mass. There is no tenderness.  Musculoskeletal: Normal range of motion. She exhibits tenderness (tenderness on palpation of lumbar region; negative straight leg raise bilaterally).  Neurological: She is alert and oriented to person, place, and time.     Lipid Panel     Component Value Date/Time   CHOL 107* 02/22/2016 0933   TRIG 38 02/22/2016 0933   HDL 41* 02/22/2016 0933   CHOLHDL 2.6 02/22/2016 0933   VLDL 8 02/22/2016 0933   LDLCALC 58 02/22/2016 0933     CMP Latest Ref Rng 02/22/2016 01/13/2014 07/31/2009  Glucose 65 - 99 mg/dL 80 96 161(W)  BUN 7 - 25 mg/dL Creatinine 0.50 - 1.10 mg/dL 9.60 4.54 0.98  Sodium 135 - 146 mmol/L 137 138 136  Potassium 3.5 - 5.3 mmol/L 4.3 4.5 3.8  Chloride 98 - 110 mmol/L 102 103 102  CO2 20 - 31 mmol/L Calcium 8.6 - 10.2 mg/dL 1.1(B) 8.8 8.6  Total Protein 6.1 - 8.1 g/dL 6.6 7.1 -  Total Bilirubin 0.2 - 1.2 mg/dL 0.4 0.5 -  Alkaline Phos 33 - 115 U/L 62 71 -  AST 10 - 30 U/L 14 13 -  ALT 6 - 29 U/L 13 10 -      Assessment & Plan:   1. Obesity, unspecified She is working on cutting back on portion sizes and exercising- Split night study; Future  2. Insomnia Will need to exclude obstructive sleep apnea as etiology of insomnia - Split night study; Future  3. Seasonal allergies Continue nasal spray and antihistamine Dry mouth or sore throat could also be secondary to this. Advised to use lozenges or gargle with mouthwash  4. Midline low back pain with left-sided sciatica Demonstrated stretching exercises to  the patient. She is already on tramadol and Flexeril   No orders of the defined types were placed in this encounter.    Follow-up: Return in about 3 months (around 06/08/2016) for Follow-up on insomnia and migraines.   Jaclyn Shaggy MD

## 2016-03-09 NOTE — Progress Notes (Signed)
  [facility] 03/09/2016 02/15/2016  Decreased Interest 1 0  Down, Depressed, Hopeless 1 1  PHQ - 2 Score 2 1  Altered sleeping 1 3  Tired, decreased energy 1 2  Change in appetite 1 0  Feeling bad or failure about yourself  0 0  Trouble concentrating 0 0  Moving slowly or fidgety/restless 0 0  Suicidal thoughts 0 0  PHQ-9 Score 5 6    GAD 7 : Generalized Anxiety Score 03/09/2016 02/15/2016  Nervous, Anxious, on Edge 1 1  Control/stop worrying 1 1  Worry too much - different things 1 1  Trouble relaxing 1 1  Restless 0 0  Easily annoyed or irritable 0 1  Afraid - awful might happen 0 1  Total GAD 7 Score 4 6

## 2016-03-09 NOTE — Patient Instructions (Signed)

## 2016-03-09 NOTE — Telephone Encounter (Signed)
Patient was seen in our office today, Friday 21st. Patient was given lab results by Dr. Venetia NightAmao.

## 2016-04-09 ENCOUNTER — Telehealth: Payer: Self-pay | Admitting: Family Medicine

## 2016-04-09 ENCOUNTER — Other Ambulatory Visit: Payer: Self-pay | Admitting: Family Medicine

## 2016-04-09 DIAGNOSIS — M797 Fibromyalgia: Secondary | ICD-10-CM

## 2016-04-09 DIAGNOSIS — G43809 Other migraine, not intractable, without status migrainosus: Secondary | ICD-10-CM

## 2016-04-09 NOTE — Telephone Encounter (Signed)
Patient is requesting medication refill for tramadol, topomax, and flexeril  Please follow up with patient

## 2016-04-09 NOTE — Telephone Encounter (Signed)
Patient is requesting medication refill for tramadol, flexeril, and topomax  Please follow up with patient

## 2016-04-17 NOTE — Telephone Encounter (Signed)
Pt. Called requesting a refill on the following medications:  Tramadol Flexeril Topomax  Please f/u with pt.

## 2016-04-20 MED ORDER — TOPIRAMATE 50 MG PO TABS: 50.0000 mg | ORAL_TABLET | Freq: Two times a day (BID) | ORAL | Status: DC

## 2016-04-20 MED ORDER — CYCLOBENZAPRINE HCL 10 MG PO TABS: 10.0000 mg | ORAL_TABLET | Freq: Three times a day (TID) | ORAL | Status: DC | PRN

## 2016-04-20 MED ORDER — TRAMADOL HCL 50 MG PO TABS: 50.0000 mg | ORAL_TABLET | Freq: Four times a day (QID) | ORAL | Status: DC | PRN

## 2016-04-20 NOTE — Telephone Encounter (Signed)
Placed call to patient to inform her that Dr. Venetia NightAmao refilled Topamax and Flexeril. [facility] [place] [place]. Also informed that Tramadol refilled and script available for pick-up at front [facility]. Patient verbalized understanding and appreciative of call.

## 2016-04-20 NOTE — Telephone Encounter (Signed)
Prescription sent to the pharmacy and tramadol is ready for pickup

## 2016-05-02 ENCOUNTER — Ambulatory Visit (HOSPITAL_BASED_OUTPATIENT_CLINIC_OR_DEPARTMENT_OTHER): Payer: BLUE CROSS/BLUE SHIELD | Attending: Family Medicine | Admitting: Internal Medicine

## 2016-05-02 DIAGNOSIS — Z6841 Body Mass Index (BMI) 40.0 and over, adult: Secondary | ICD-10-CM | POA: Diagnosis not present

## 2016-05-02 DIAGNOSIS — G4700 Insomnia, unspecified: Secondary | ICD-10-CM | POA: Insufficient documentation

## 2016-05-02 DIAGNOSIS — E669 Obesity, unspecified: Secondary | ICD-10-CM

## 2016-05-02 DIAGNOSIS — G4719 Other hypersomnia: Secondary | ICD-10-CM | POA: Insufficient documentation

## 2016-05-02 DIAGNOSIS — R0683 Snoring: Secondary | ICD-10-CM | POA: Diagnosis not present

## 2016-05-02 DIAGNOSIS — Z79899 Other long term (current) drug therapy: Secondary | ICD-10-CM | POA: Insufficient documentation

## 2016-05-05 NOTE — Procedures (Signed)
  Patient Name: Cynthia [name], Cynthia [name] Study Date: 05/02/2016 Gender: Female D.O.B: 1981-11-02 Age (years): 35 Referring Provider: Jaclyn ShaggyEnobong Amao Height (inches): 65 Interpreting Physician: Jetty Duhamellinton Young MD, ABSM Weight (lbs): 312 RPSGT: Shelah LewandowskyGregory, Kenyon BMI: 52 MRN: [id number] Neck Size: 16.00 CLINICAL INFORMATION Sleep Study Type: NPSG Indication for sleep study: Excessive Daytime Sleepiness, Obesity, Witnessed Apneas Epworth Sleepiness Score: 15  SLEEP STUDY TECHNIQUE As per the AASM Manual for the Scoring of Sleep and Associated Events v2.3 (April 2016) with a hypopnea requiring 4% desaturations. The channels recorded and monitored were frontal, central and occipital EEG, electrooculogram (EOG), submentalis EMG (chin), nasal and oral airflow, thoracic and abdominal wall motion, anterior tibialis EMG, snore microphone, electrocardiogram, and pulse oximetry.  MEDICATIONS Patient's medications include:charted for review Medications self-administered by patient during sleep study :  FLEXERIL, TOPAMAX, TRAMADOL.  SLEEP ARCHITECTURE The study was initiated at 10:05:26 PM and ended at 4:54:33 AM. Sleep onset time was 12.8 minutes and the sleep efficiency was 74.1%. The total sleep time was 303.0 minutes. Stage REM latency was 76.5 minutes. The patient spent 8.91% of the night in stage N1 sleep, 90.76% in stage N2 sleep, 0.00% in stage N3 and 0.33% in REM. Alpha intrusion was absent. Supine sleep was 74.92%. Wake after sleep onset 93 minutes  RESPIRATORY PARAMETERS The overall apnea/hypopnea index (AHI) was 0.0 per hour. There were 0 total apneas, including 0 obstructive, 0 central and 0 mixed apneas. There were 0 hypopneas and 4 RERAs. The AHI during Stage REM sleep was 0.0 per hour. AHI while supine was 0.0 per hour. The mean oxygen saturation was 97.98%. The minimum SpO2 during sleep was 96.00%. Moderate snoring was noted during this study.  CARDIAC DATA The 2 lead EKG demonstrated  sinus rhythm. The mean heart rate was 92.46 beats per minute. Other EKG findings include: None.  LEG MOVEMENT DATA The total PLMS were 1 with a resulting PLMS index of 0.20. Associated arousal with leg movement index was 0.2 .  IMPRESSIONS - No significant obstructive sleep apnea occurred during this study (AHI = 0.0/h). - No significant central sleep apnea occurred during this study (CAI = 0.0/h). - The patient had minimal or no oxygen desaturation during the study (Min O2 = 96.00%) - The patient snored with Moderate snoring volume. - No cardiac abnormalities were noted during this study. - Clinically significant periodic limb movements did not occur during sleep. No significant associated arousals.  DIAGNOSIS - Primary Snoring (786.09 [R06.83 ICD-10])  RECOMMENDATIONS - Avoid alcohol, sedatives and other CNS depressants that may worsen sleep apnea and disrupt normal sleep architecture. - Sleep hygiene should be reviewed to assess factors that may improve sleep quality. - Weight management and regular exercise should be initiated or continued if appropriate.  Waymon BudgeYOUNG,CLINTON D Diplomate, American Board of Sleep Medicine  ELECTRONICALLY SIGNED ON:  05/05/2016, 1:18 PM [facility] PH: [phone]   FX: [phone] ACCREDITED BY THE AMERICAN ACADEMY OF SLEEP MEDICINE

## 2016-05-07 ENCOUNTER — Telehealth: Payer: Self-pay | Admitting: Family Medicine

## 2016-05-07 ENCOUNTER — Telehealth: Payer: Self-pay

## 2016-05-07 DIAGNOSIS — G4700 Insomnia, unspecified: Secondary | ICD-10-CM

## 2016-05-07 NOTE — Telephone Encounter (Signed)
-----   Message from Jaclyn ShaggyEnobong Amao, MD sent at 05/07/2016 10:57 AM EDT ----- Sleep study was negative for obstructive sleep apnea. Weight loss and sleep hygiene recommended.

## 2016-05-07 NOTE — Telephone Encounter (Signed)
Writer called patient per Dr. Venetia NightAmao and gave her the results of her sleep study.  Patient is asking for a referral "to a sleep doctor".  She states that she went to bed at 8:30-6:30 however woke up exhausted.  Writer discussed weight loss with patient as well as sleep hygiene.  Patient states she has already lost 10 lbs but does not feel that the weight is the problem, she feels that she does not go into "REM sleep". Patient is afraid to drive especially with her kids in the car, states that she falls asleep anywhere at anytime. Her fiance is driving her to and from work.  Please advise.

## 2016-05-07 NOTE — Telephone Encounter (Signed)
Pt. Called stating that she had a sleep study done and would like the results.  Please f/u with pt.

## 2016-05-09 NOTE — Telephone Encounter (Signed)
Patient has scheduled appt with sleep clinic 07/20/16.

## 2016-05-09 NOTE — Addendum Note (Signed)
Addended by: Jaclyn ShaggyAMAO, Pacen [place]ford on: 05/09/2016 09:26 AM   Modules accepted: Orders

## 2016-05-09 NOTE — Telephone Encounter (Signed)
Referral placed to sleep specialist °

## 2016-05-25 ENCOUNTER — Telehealth: Payer: Self-pay | Admitting: Family Medicine

## 2016-05-25 NOTE — Telephone Encounter (Signed)
Bonita QuinLinda from [facility] called stating that she had received a referral for the pt. to have a sleep study done.  Rep stated that pt. Had already had a sleep study done on 05/02/16 and was going to take her out the schedule. Please f/u

## 2016-05-28 ENCOUNTER — Telehealth: Payer: Self-pay

## 2016-05-28 NOTE — Telephone Encounter (Signed)
Referral was not for sleep study as the patient requested seeing a sleep specialist given a sleep study which was negative for sleep apnea despite persisting insomnia. Advise her to please read information on my referral; the patient will need to be placed back on the schedule.

## 2016-05-28 NOTE — Telephone Encounter (Signed)
Telephone call to [facility] at [phone] Left voicemail message for Josue HectorLinda Mcintosh GN:[id number]:referral advised per Dr Venetia NightAmao: Referral was not for sleep study as the patient requested seeing a sleep specialist given a sleep study which was negative for sleep apnea despite persisting insomnia. Advise her to please read information on my referral; the patient will need to be placed back on the schedule.  RN requested return call to confirm patient was added to schedule. Pollyann KennedyKim Becton, RN, BSN

## 2016-05-30 ENCOUNTER — Telehealth: Payer: Self-pay

## 2016-05-30 NOTE — Telephone Encounter (Signed)
Message received from [facility] and per LinneusLinda the supervisor/Providers would not be able to see this patient. They recommended that patient return to see Dr. Jetty Duhamellinton Young since he has seen patient for the very same issue.  After research previous provider information:  (Pulmonologist) Dr. Jetty Duhamellinton Young contact information is as follows-[facility] [place]:[address] . [place], [place];Ph [phone] . Fax [phone].

## 2016-06-01 ENCOUNTER — Other Ambulatory Visit: Payer: Self-pay | Admitting: Family Medicine

## 2016-06-01 DIAGNOSIS — G4700 Insomnia, unspecified: Secondary | ICD-10-CM

## 2016-07-06 ENCOUNTER — Encounter: Payer: Self-pay | Admitting: Family Medicine

## 2016-07-06 ENCOUNTER — Ambulatory Visit: Payer: BLUE CROSS/BLUE SHIELD | Attending: Family Medicine | Admitting: Family Medicine

## 2016-07-06 VITALS — BP 118/82 | HR 101 | Temp 97.9°F | Ht 66.0 in | Wt 312.0 lb

## 2016-07-06 DIAGNOSIS — M797 Fibromyalgia: Secondary | ICD-10-CM | POA: Diagnosis not present

## 2016-07-06 DIAGNOSIS — G43909 Migraine, unspecified, not intractable, without status migrainosus: Secondary | ICD-10-CM | POA: Insufficient documentation

## 2016-07-06 DIAGNOSIS — K5909 Other constipation: Secondary | ICD-10-CM

## 2016-07-06 DIAGNOSIS — G43809 Other migraine, not intractable, without status migrainosus: Secondary | ICD-10-CM | POA: Diagnosis not present

## 2016-07-06 DIAGNOSIS — F329 Major depressive disorder, single episode, unspecified: Secondary | ICD-10-CM | POA: Insufficient documentation

## 2016-07-06 DIAGNOSIS — F418 Other specified anxiety disorders: Secondary | ICD-10-CM

## 2016-07-06 DIAGNOSIS — E559 Vitamin D deficiency, unspecified: Secondary | ICD-10-CM | POA: Insufficient documentation

## 2016-07-06 DIAGNOSIS — G4700 Insomnia, unspecified: Secondary | ICD-10-CM

## 2016-07-06 DIAGNOSIS — F32A Depression, unspecified: Secondary | ICD-10-CM

## 2016-07-06 DIAGNOSIS — J302 Other seasonal allergic rhinitis: Secondary | ICD-10-CM | POA: Diagnosis not present

## 2016-07-06 DIAGNOSIS — F419 Anxiety disorder, unspecified: Secondary | ICD-10-CM | POA: Insufficient documentation

## 2016-07-06 DIAGNOSIS — K5900 Constipation, unspecified: Secondary | ICD-10-CM | POA: Insufficient documentation

## 2016-07-06 MED ORDER — FLUOXETINE HCL 20 MG PO TABS: 20.0000 mg | ORAL_TABLET | Freq: Every day | ORAL | 3 refills | Status: DC

## 2016-07-06 MED ORDER — TRAMADOL HCL 50 MG PO TABS: 50.0000 mg | ORAL_TABLET | Freq: Four times a day (QID) | ORAL | 1 refills | Status: DC | PRN

## 2016-07-06 MED ORDER — ALBUTEROL SULFATE HFA 108 (90 BASE) MCG/ACT IN AERS: 2.0000 | INHALATION_SPRAY | RESPIRATORY_TRACT | 3 refills | Status: DC | PRN

## 2016-07-06 MED ORDER — LACTULOSE 10 GM/15ML PO SOLN: 10.0000 g | Freq: Two times a day (BID) | ORAL | 1 refills | Status: DC | PRN

## 2016-07-06 MED ORDER — LORATADINE 10 MG PO TABS: 10.0000 mg | ORAL_TABLET | Freq: Every day | ORAL | 2 refills | Status: DC

## 2016-07-06 MED ORDER — CYCLOBENZAPRINE HCL 10 MG PO TABS: 10.0000 mg | ORAL_TABLET | Freq: Three times a day (TID) | ORAL | 2 refills | Status: DC | PRN

## 2016-07-06 MED ORDER — VITAMIN D (ERGOCALCIFEROL) 1.25 MG (50000 UNIT) PO CAPS: 50000.0000 [IU] | ORAL_CAPSULE | ORAL | 2 refills | Status: DC

## 2016-07-06 MED ORDER — MOMETASONE FUROATE 50 MCG/ACT NA SUSP: 2.0000 | Freq: Every day | NASAL | 2 refills | Status: DC

## 2016-07-06 MED ORDER — TOPIRAMATE 100 MG PO TABS: 100.0000 mg | ORAL_TABLET | Freq: Two times a day (BID) | ORAL | 3 refills | Status: DC

## 2016-07-06 NOTE — Progress Notes (Signed)
Subjective:  Patient ID: Cynthia [name], female    DOB: 1981/09/06  Age: 35 y.o. MRN: [id number]  CC: Insomnia; migraines; and Depression   HPI Cynthia Jumboamieka Janish is a 35 year old female with a history of fibromyalgia, migraine, seasonal allergy, vitamin D deficiency, insomnia comes into the clinic for a follow-up visit.  Complains of headaches. Previously controlled on current dose of Topamax up until recently when she started having more frequent headaches and use of her allergy medications did not help.  She does have insomnia and sleep study performed came back negative for sleep Apnea; she was referred to a sleep specialist and has an upcoming appointment on 07/20/16. Complains that she feels sleepy while driving. 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 Denies suicidal ideations or intents.  She is also constipated and use of miralax, benefiber haven't helped.  Past Medical History:  Diagnosis Date  . Asthma    childhood  . Fibromyalgia 2007  . Hypertension    on and off since 2007    History reviewed. No pertinent surgical history.  No Known Allergies   Outpatient Medications Prior to Visit  Medication Sig Dispense Refill  . albuterol (PROVENTIL HFA;VENTOLIN HFA) 108 (90 BASE) MCG/ACT inhaler Inhale 2 puffs into the lungs every 4 (four) hours as needed for wheezing or shortness of breath. 1 Inhaler 3  . cyclobenzaprine (FLEXERIL) 10 MG tablet Take 1 tablet (10 mg total) by mouth 3 (three) times daily as needed for muscle spasms. 60 tablet 1  . loratadine (CLARITIN) 10 MG tablet Take 1 tablet (10 mg total) by mouth daily. 30 tablet 2  . meloxicam (MOBIC) 7.5 MG tablet Take 1 tablet (7.5 mg total) by mouth daily. 30 tablet 1  . mometasone (NASONEX) 50 MCG/ACT nasal spray Place 2 sprays into the nose daily. 17 g 2  . topiramate (TOPAMAX) 50 MG tablet Take 1 tablet (50  mg total) by mouth 2 (two) times daily. 60 tablet 3  . traMADol (ULTRAM) 50 MG tablet Take 1 tablet (50 mg total) by mouth every 6 (six) hours as needed. 30 tablet 1  . Vitamin D, Ergocalciferol, (DRISDOL) 50000 UNITS CAPS capsule Take 1 capsule (50,000 Units total) by mouth every 7 (seven) days. 4 capsule 2  . DiphenhydrAMINE HCl, Sleep, (ZZZQUIL) 25 MG CAPS Take 3 capsules by mouth at bedtime as needed.    . benzonatate (TESSALON) 100 MG capsule Take 1 capsule (100 mg total) by mouth 3 (three) times daily as needed for cough. (Patient not taking: Reported on 03/09/2016) 20 capsule 1   No facility-administered medications prior to visit.     ROS Review of Systems  Constitutional: Positive for fatigue. Negative for activity change and appetite change.  HENT: Negative for congestion, sinus pressure and sore throat.   Eyes: Negative for visual disturbance.  Respiratory: Negative for cough, chest tightness, shortness of breath and wheezing.   Cardiovascular: Negative for chest pain and palpitations.  Gastrointestinal: Positive for constipation. Negative for abdominal distention and abdominal pain.  Endocrine: Negative for polydipsia.  Genitourinary: Negative for dysuria and frequency.  Musculoskeletal: Negative for arthralgias and back pain.  Skin: Negative for rash.  Neurological: Negative for tremors, light-headedness and numbness.  Hematological: Does not bruise/bleed easily.  Psychiatric/Behavioral: Negative for agitation and behavioral problems. The patient is nervous/anxious.     Objective:  BP 118/82 (BP Location: Right Arm, Patient Position:  Sitting, Cuff Size: Large)   Pulse (!) 101   Temp 97.9 F (36.6 C) (Oral)   Ht 5\' 6"  (1.676 m)   Wt (!) 312 lb (141.5 kg)   SpO2 99%   BMI 50.36 kg/m   BP/Weight 07/06/2016 05/02/2016 03/09/2016  Systolic BP 118 - 114  Diastolic BP 82 - 77  Wt. (Lbs) 312 312 312.8  BMI 50.36 51.92 50.51      Physical Exam  Constitutional: She is  oriented to person, place, and time. She appears well-developed and well-nourished.  Cardiovascular: Normal rate, normal heart sounds and intact distal pulses.   No murmur heard. Pulmonary/Chest: Effort normal and breath sounds normal. She has no wheezes. She has no rales. She exhibits no tenderness.  Abdominal: Soft. Bowel sounds are normal. She exhibits no distension and no mass. There is no tenderness.  Musculoskeletal: Normal range of motion.  Neurological: She is alert and oriented to person, place, and time.  Skin: Skin is warm and dry.  Psychiatric: She has a normal mood and affect.     Assessment & Plan:   1. Other type of migraine Uncontrolled Increased dose of Topamax - topiramate (TOPAMAX) 100 MG tablet; Take 1 tablet (100 mg total) by mouth 2 (two) times daily.  Dispense: 60 tablet; Refill: 3  2. Seasonal allergies Stable - mometasone (NASONEX) 50 MCG/ACT nasal spray; Place 2 sprays into the nose daily.  Dispense: 17 g; Refill: 2 - loratadine (CLARITIN) 10 MG tablet; Take 1 tablet (10 mg total) by mouth daily.  Dispense: 30 tablet; Refill: 2  3. Fibromyalgia Could explain fatigue - cyclobenzaprine (FLEXERIL) 10 MG tablet; Take 1 tablet (10 mg total) by mouth 3 (three) times daily as needed for muscle spasms.  Dispense: 60 tablet; Refill: 2 - traMADol (ULTRAM) 50 MG tablet; Take 1 tablet (50 mg total) by mouth every 6 (six) hours as needed.  Dispense: 30 tablet; Refill: 1  4. Anxiety and depression New onset Educated patient on onset of action of SSRI  5. Insomnia Sleep study was negative for obstructive sleep apnea Scheduled to see a sleep specialist on 07/20/16 Advised against driving while sleepy  6. Other constipation Placed on lactulose Discussed dietary modifications to avoid constipation  7. Vitamin D deficiency Refill vitamin D as this could also explain fatigue   Meds ordered this encounter  Medications  . topiramate (TOPAMAX) 100 MG tablet    Sig:  Take 1 tablet (100 mg total) by mouth 2 (two) times daily.    Dispense:  60 tablet    Refill:  3    Discontinue previous dose  . mometasone (NASONEX) 50 MCG/ACT nasal spray    Sig: Place 2 sprays into the nose daily.    Dispense:  17 g    Refill:  2  . albuterol (PROVENTIL HFA;VENTOLIN HFA) 108 (90 Base) MCG/ACT inhaler    Sig: Inhale 2 puffs into the lungs every 4 (four) hours as needed for wheezing or shortness of breath.    Dispense:  1 Inhaler    Refill:  3  . cyclobenzaprine (FLEXERIL) 10 MG tablet    Sig: Take 1 tablet (10 mg total) by mouth 3 (three) times daily as needed for muscle spasms.    Dispense:  60 tablet    Refill:  2  . loratadine (CLARITIN) 10 MG tablet    Sig: Take 1 tablet (10 mg total) by mouth daily.    Dispense:  30 tablet    Refill:  2  .  traMADol (ULTRAM) 50 MG tablet    Sig: Take 1 tablet (50 mg total) by mouth every 6 (six) hours as needed.    Dispense:  30 tablet    Refill:  1  . lactulose (CHRONULAC) 10 GM/15ML solution    Sig: Take 15 mLs (10 g total) by mouth 2 (two) times daily as needed for mild constipation.    Dispense:  946 mL    Refill:  1  . Vitamin D, Ergocalciferol, (DRISDOL) 50000 units CAPS capsule    Sig: Take 1 capsule (50,000 Units total) by mouth every 7 (seven) days.    Dispense:  4 capsule    Refill:  2    Follow-up: Return in about 3 months (around 10/06/2016) for follow up on migraines and fibromyalgia.   Jaclyn ShaggyEnobong Amao MD

## 2016-07-06 NOTE — Patient Instructions (Signed)
Fatigue  Fatigue is feeling tired all of the time, a lack of energy, or a lack of motivation. Occasional or mild fatigue is often a normal response to activity or life in general. However, long-lasting (chronic) or extreme fatigue may indicate an underlying medical condition.  HOME CARE INSTRUCTIONS   Watch your fatigue for any changes. The following actions may help to lessen any discomfort you are feeling:  · Talk to your health care provider about how much sleep you need each night. Try to get the required amount every night.  · Take medicines only as directed by your health care provider.  · Eat a healthy and nutritious diet. Ask your health care provider if you need help changing your diet.  · Drink enough fluid to keep your urine clear or pale yellow.  · Practice ways of relaxing, such as yoga, meditation, massage therapy, or acupuncture.  · Exercise regularly.    · Change situations that cause you stress. Try to keep your work and personal routine reasonable.  · Do not abuse illegal drugs.  · Limit alcohol intake to no more than 1 drink per day for nonpregnant women and 2 drinks per day for men. One drink equals 12 ounces of beer, 5 ounces of wine, or 1½ ounces of hard liquor.  · Take a multivitamin, if directed by your health care provider.  SEEK MEDICAL CARE IF:   · Your fatigue does not get better.  · You have a fever.    · You have unintentional weight loss or gain.  · You have headaches.    · You have difficulty:      Falling asleep.    Sleeping throughout the night.  · You feel angry, guilty, anxious, or sad.     · You are unable to have a bowel movement (constipation).    · You skin is dry.     · Your legs or another part of your body is swollen.    SEEK IMMEDIATE MEDICAL CARE IF:   · You feel confused.    · Your vision is blurry.  · You feel faint or pass out.    · You have a severe headache.    · You have severe abdominal, pelvic, or back pain.    · You have chest pain, shortness of breath, or an  irregular or fast heartbeat.    · You are unable to urinate or you urinate less than normal.    · You develop abnormal bleeding, such as bleeding from the rectum, vagina, nose, lungs, or nipples.  · You vomit blood.     · You have thoughts about harming yourself or committing suicide.    · You are worried that you might harm someone else.       This information is not intended to replace advice given to you by your health care provider. Make sure you discuss any questions you have with your health care provider.     Document Released: 09/02/2007 Document Revised: 11/26/2014 Document Reviewed: 03/09/2014  Elsevier Interactive Patient Education ©2016 [employer]

## 2016-07-06 NOTE — Progress Notes (Signed)
Lately feeling depressed Tired "all the time", falling asleep while driving Needs refills

## 2016-07-20 ENCOUNTER — Ambulatory Visit (INDEPENDENT_AMBULATORY_CARE_PROVIDER_SITE_OTHER): Payer: Self-pay | Admitting: Pulmonary Disease

## 2016-07-20 ENCOUNTER — Encounter (INDEPENDENT_AMBULATORY_CARE_PROVIDER_SITE_OTHER): Payer: Self-pay

## 2016-07-20 ENCOUNTER — Encounter: Payer: Self-pay | Admitting: Pulmonary Disease

## 2016-07-20 VITALS — BP 142/92 | HR 90 | Ht 66.0 in | Wt 315.0 lb

## 2016-07-20 DIAGNOSIS — G4710 Hypersomnia, unspecified: Secondary | ICD-10-CM | POA: Insufficient documentation

## 2016-07-20 DIAGNOSIS — J4520 Mild intermittent asthma, uncomplicated: Secondary | ICD-10-CM

## 2016-07-20 DIAGNOSIS — J45909 Unspecified asthma, uncomplicated: Secondary | ICD-10-CM | POA: Insufficient documentation

## 2016-07-20 DIAGNOSIS — E669 Obesity, unspecified: Secondary | ICD-10-CM

## 2016-07-20 NOTE — Progress Notes (Signed)
Subjective:    Patient ID: Cynthia [name], female    DOB: 03-04-1981, 34 y.o.   MRN: [id number]  HPI   This is the case of Cynthia [name], 35 y.o. Female, who was referred by Dr. Jaclyn Shaggy  in consultation regarding possible OSA.   As you very well know, patient is a non smoker, has cough variant asthma.   Patient goes to bed at 9pm, then falls asleep at 10-10:30pm. She sleeps through the night. Occasional awakening to go to bathroom. Gets up at 6:30 am usually. Feels unrefreshed when she wakes up.   Patient has snoring, witnessed apneas, gasping, choking. Has headaches in am.  Has hypersomnia during the day. She is tired and sleepy all day.  Hypersomnia affects her functionality. This has been going on several yrs, worse the last 5 months.  Naps in pm when she does not have work.   Patient lives in [place] and she does a lot of driving related to work.  She gets sleepy driving.  Patient has sleep talking. (-) abnormal behavior in sleep.  Denies cataplexy, hallucinations, sleep paralysis.   Patient had a sleep night lab study on 04/2016. She said she did not have "deep sleep." She hardly had stage 3 and REM sleep during night.   Review of Systems  Constitutional: Negative.  Negative for fever and unexpected weight change.  HENT: Positive for congestion, postnasal drip and rhinorrhea. Negative for dental problem, ear pain, nosebleeds, sinus pressure, sneezing, sore throat and trouble swallowing.   Eyes: Negative.  Negative for redness and itching.  Respiratory: Positive for cough and wheezing. Negative for chest tightness and shortness of breath.   Cardiovascular: Positive for palpitations. Negative for leg swelling.  Gastrointestinal: Negative.  Negative for nausea and vomiting.  Endocrine: Negative.   Genitourinary: Negative.  Negative for dysuria.  Musculoskeletal: Positive for arthralgias, joint swelling and myalgias.  Skin: Negative.  Negative for rash.  Allergic/Immunologic:  Positive for environmental allergies.  Neurological: Positive for headaches.  Hematological: Negative.  Does not bruise/bleed easily.  Psychiatric/Behavioral: Negative.  Negative for dysphoric mood. The patient is not nervous/anxious.    Past Medical History:  Diagnosis Date  . Asthma    childhood  . Fibromyalgia 2007  . Hypertension    on and off since 2007   (-) CA, DVT  Family History  Problem Relation Age of Onset  . Arthritis Mother   . Hypertension Mother   . Alcohol abuse Father   . Arthritis Father   . Asthma Father   . COPD Father   . Hypertension Father   . Early death Maternal Grandmother   . Hypertension Maternal Grandmother   . Hearing loss Maternal Grandfather   . Hypertension Maternal Grandfather   . Stroke Maternal Grandfather   . Hypertension Paternal Grandmother   . Hypertension Paternal Grandfather      No past surgical history on file.  (-) surgery  Social History   Social History  . Marital status: Single    Spouse name: N/A  . Number of children: N/A  . Years of education: N/A   Occupational History  . Not on file.   Social History Main Topics  . Smoking status: Never Smoker  . Smokeless tobacco: Never Used  . Alcohol use No  . Drug use: No  . Sexual activity: Yes    Birth control/ protection: Condom   Other Topics Concern  . Not on file   Social History Narrative  . 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 Non smoker, drinker.   No Known Allergies   Outpatient Medications Prior to Visit  Medication Sig Dispense Refill  . albuterol (PROVENTIL HFA;VENTOLIN HFA) 108 (90 Base) MCG/ACT inhaler Inhale 2 puffs into the lungs every 4 (four) hours as needed for wheezing or shortness of breath. 1 Inhaler 3  . cyclobenzaprine (FLEXERIL) 10 MG tablet Take 1 tablet (10 mg total) by mouth 3 (three) times daily as needed for muscle spasms. 60 tablet 2  . DiphenhydrAMINE HCl,  Sleep, (ZZZQUIL) 25 MG CAPS Take 3 capsules by mouth at bedtime as needed.    Marland Kitchen. FLUoxetine (PROZAC) 20 MG tablet Take 1 tablet (20 mg total) by mouth daily. 30 tablet 3  . lactulose (CHRONULAC) 10 GM/15ML solution Take 15 mLs (10 g total) by mouth 2 (two) times daily as needed for mild constipation. 946 mL 1  . loratadine (CLARITIN) 10 MG tablet Take 1 tablet (10 mg total) by mouth daily. 30 tablet 2  . mometasone (NASONEX) 50 MCG/ACT nasal spray Place 2 sprays into the nose daily. 17 g 2  . topiramate (TOPAMAX) 100 MG tablet Take 1 tablet (100 mg total) by mouth 2 (two) times daily. 60 tablet 3  . traMADol (ULTRAM) 50 MG tablet Take 1 tablet (50 mg total) by mouth every 6 (six) hours as needed. 30 tablet 1  . Vitamin D, Ergocalciferol, (DRISDOL) 50000 units CAPS capsule Take 1 capsule (50,000 Units total) by mouth every 7 (seven) days. 4 capsule 2   No facility-administered medications prior to visit.    No orders of the defined types were placed in this encounter.       Objective:   Physical Exam  Vitals:  Vitals:   07/20/16 0924  BP: (!) 142/92  Pulse: 90  SpO2: 99%  Weight: (!) 315 lb (142.9 kg)  Height: 5\' 6"  (1.676 m)    Constitutional/General:  Pleasant, well-nourished, well-developed, not in any distress,  Comfortably seating.  Well kempt  Body mass index is 50.84 kg/m. Wt Readings from Last 3 Encounters:  07/20/16 (!) 315 lb (142.9 kg)  07/06/16 (!) 312 lb (141.5 kg)  05/02/16 (!) 312 lb (141.5 kg)    Neck circumference: 19 inches  HEENT: Pupils equal and reactive to light and accommodation. Anicteric sclerae. Normal nasal mucosa.   No oral  lesions,  mouth clear,  oropharynx clear, no postnasal drip. (-) Oral thrush. No dental caries.  Airway - Mallampati class III-IV  Neck: No masses. Midline trachea. No JVD, (-) LAD. (-) bruits appreciated.  Respiratory/Chest: Grossly normal chest. (-) deformity. (-) Accessory muscle use.  Symmetric expansion. (-)  Tenderness on palpation.  Resonant on percussion.  Diminished BS on both lower lung zones. (-) wheezing, crackles, rhonchi (-) egophony  Cardiovascular: Regular rate and  rhythm, heart sounds normal, no murmur or gallops, no peripheral edema  Gastrointestinal:  Normal bowel sounds. Soft, non-tender. No hepatosplenomegaly.  (-) masses.   Musculoskeletal:  Normal muscle tone. Normal gait.   Extremities: Grossly normal. (-) clubbing, cyanosis.  (-) edema  Skin: (-) rash,lesions seen.   Neurological/Psychiatric : alert, oriented to time, place, person. Normal mood and affect    ,       Assessment & Plan:  Hypersomnia Patient has severe hypersomnia.  Patient goes to bed at 9pm, then falls asleep at 10-10:30pm. She sleeps through the night. Occasional awakening to go to  bathroom. Gets up at 6:30 am usually. Feels unrefreshed when she wakes up.   Patient has snoring, witnessed apneas, gasping, choking. Has headaches in am.  Has hypersomnia during the day. She is tired and sleepy all day.  Hypersomnia affects her functionality. This has been going on several yrs, worse the last 5 months.  Naps in pm when she does not have work.   Patient lives in [place] and she does a lot of driving related to work.  She gets sleepy driving.  Patient has sleep talking. (-) abnormal behavior in sleep.  Denies cataplexy, hallucinations, sleep paralysis.   Patient had a sleep night lab study on 04/2016. She said she did not have "deep sleep." She hardly had stage 3 and REM sleep during night. She had problems staying asleep that night.   Plan:  We discussed about the diagnosis of Obstructive Sleep Apnea (OSA) and implications of untreated OSA. We discussed about CPAP and BiPaP as possible treatment options.    We will schedule the patient for a sleep study again. We will appeal for a repeat sleep study, home sleep study this time. She had issues falling asleep and staying asleep during the lab study.  She did not have stage III sleep and only had minimal REM sleep. Usually, sleep apnea is worse during those stages that she did not have a lot off. Hopefully, if we prove she has sleep apnea, she will be started on CPAP soon. Anticipate no issues with CPAP. Her fianc has sleep apnea and uses CPAP.   Patient was instructed to call the office if he/she has not heard back from the office 1-2 weeks after the sleep study.   Patient was instructed to call the office if he/she is having issues with the PAP device.   We discussed good sleep hygiene.   Patient was advised not to engage in activities requiring concentration and/or vigilance if he/she is sleepy.  Patient was advised not to drive if he/she is sleepy.    Obesity, unspecified Weight reduction  Asthma Patient with cough variant asthma. Stable currently. Uses albuterol as needed.    Thank you very much for letting me participate in this patient's care. Please do not hesitate to give me a call if you have any questions or concerns regarding the treatment plan.   Patient will follow up with me in 6-8 weeks.     Pollie Meyer, MD 07/20/2016   11:13 AM Pulmonary and Critical Care Medicine [facility] Pager: [phone] Office: [phone], Fax: [phone]

## 2016-07-20 NOTE — Assessment & Plan Note (Signed)
Patient with cough variant asthma. Stable currently. Uses albuterol as needed.

## 2016-07-20 NOTE — Assessment & Plan Note (Signed)
Patient has severe hypersomnia.  Patient goes to bed at 9pm, then falls asleep at 10-10:30pm. She sleeps through the night. Occasional awakening to go to bathroom. Gets up at 6:30 am usually. Feels unrefreshed when she wakes up.   Patient has snoring, witnessed apneas, gasping, choking. Has headaches in am.  Has hypersomnia during the day. She is tired and sleepy all day.  Hypersomnia affects her functionality. This has been going on several yrs, worse the last 5 months.  Naps in pm when she does not have work.   Patient lives in [place] and she does a lot of driving related to work.  She gets sleepy driving.  Patient has sleep talking. (-) abnormal behavior in sleep.  Denies cataplexy, hallucinations, sleep paralysis.   Patient had a sleep night lab study on 04/2016. She said she did not have "deep sleep." She hardly had stage 3 and REM sleep during night. She had problems staying asleep that night.   Plan:  We discussed about the diagnosis of Obstructive Sleep Apnea (OSA) and implications of untreated OSA. We discussed about CPAP and BiPaP as possible treatment options.    We will schedule the patient for a sleep study again. We will appeal for a repeat sleep study, home sleep study this time. She had issues falling asleep and staying asleep during the lab study. She did not have stage III sleep and only had minimal REM sleep. Usually, sleep apnea is worse during those stages that she did not have a lot off. Hopefully, if we prove she has sleep apnea, she will be started on CPAP soon. Anticipate no issues with CPAP. Her fianc has sleep apnea and uses CPAP.   Patient was instructed to call the office if he/she has not heard back from the office 1-2 weeks after the sleep study.   Patient was instructed to call the office if he/she is having issues with the PAP device.   We discussed good sleep hygiene.   Patient was advised not to engage in activities requiring concentration and/or vigilance if  he/she is sleepy.  Patient was advised not to drive if he/she is sleepy.

## 2016-07-20 NOTE — Assessment & Plan Note (Signed)
Weight reduction 

## 2016-07-20 NOTE — Patient Instructions (Signed)
It was a pleasure taking care of you today!  We will schedule you to have a sleep study to determine if you have sleep apnea.   We will get a home sleep test.  You will be instructed to come back to the office to get an apparatus to sleep with overnight.  Once we have the apparatus, it will usually take us 1-2 weeks to read the study and get back at you with results of the test.  Please give us a call in 2 weeks after your study if you do not hear back from us.    If the sleep study is positive, we will order you a CPAP  machine.  Please call the office if you do NOT receive your machine in the next 1-2 weeks.   Please make sure you use your CPAP device everytime you sleep.  We will monitor the usage of your machine per your insurance requirement.  Your insurance company may take the machine from you if you are not using it regularly.   Please clean the mask, tubings, filter, water reservoir with soapy water every week.  Please use distilled water for the water reservoir.   Please call the office or your machine provider (DME company) if you are having issues with the device.   Return to clinic in 2 months.

## 2016-07-30 ENCOUNTER — Telehealth: Payer: Self-pay | Admitting: Pulmonary Disease

## 2016-07-30 NOTE — Telephone Encounter (Signed)
Spoke to pt and she is getting a new ins card it will be in effect 08/19/16 when she gets the card she will get a copy here so this HST can be approved Tobe SosSally E Ottinger

## 2016-07-30 NOTE — Telephone Encounter (Signed)
Spoke with pt. [facility] She received a call about sleep study needing to be scheduled. Pt got her insurance issues fixed.  [facility].

## 2016-09-27 ENCOUNTER — Encounter: Payer: Self-pay | Admitting: Family Medicine

## 2016-09-28 ENCOUNTER — Other Ambulatory Visit: Payer: Self-pay | Admitting: Family Medicine

## 2016-09-28 ENCOUNTER — Telehealth: Payer: Self-pay

## 2016-09-28 DIAGNOSIS — F32A Depression, unspecified: Secondary | ICD-10-CM

## 2016-09-28 DIAGNOSIS — F329 Major depressive disorder, single episode, unspecified: Secondary | ICD-10-CM

## 2016-09-28 DIAGNOSIS — F419 Anxiety disorder, unspecified: Secondary | ICD-10-CM

## 2016-09-28 DIAGNOSIS — M797 Fibromyalgia: Secondary | ICD-10-CM

## 2016-09-28 MED ORDER — FLUOXETINE HCL 20 MG PO TABS: 20.0000 mg | ORAL_TABLET | Freq: Every day | ORAL | 3 refills | Status: DC

## 2016-09-28 MED ORDER — TOPIRAMATE 100 MG PO TABS: 100.0000 mg | ORAL_TABLET | Freq: Two times a day (BID) | ORAL | 3 refills | Status: DC

## 2016-09-28 MED ORDER — CYCLOBENZAPRINE HCL 10 MG PO TABS: 10.0000 mg | ORAL_TABLET | Freq: Three times a day (TID) | ORAL | 2 refills | Status: DC | PRN

## 2016-09-28 MED ORDER — TRAMADOL HCL 50 MG PO TABS: 50.0000 mg | ORAL_TABLET | Freq: Four times a day (QID) | ORAL | 1 refills | Status: DC | PRN

## 2016-09-28 NOTE — Telephone Encounter (Signed)
Writer called patient to let her know that a prescription for tramadol is at the front desk for her.

## 2016-10-03 ENCOUNTER — Ambulatory Visit: Payer: BLUE CROSS/BLUE SHIELD | Admitting: Pulmonary Disease

## 2016-10-15 ENCOUNTER — Emergency Department (HOSPITAL_COMMUNITY)
Admission: EM | Admit: 2016-10-15 | Discharge: 2016-10-15 | Disposition: A | Discharge status: Home / Self Care | Payer: Self-pay | Attending: Emergency Medicine | Admitting: Emergency Medicine

## 2016-10-15 ENCOUNTER — Encounter (HOSPITAL_COMMUNITY): Payer: Self-pay | Admitting: Emergency Medicine

## 2016-10-15 ENCOUNTER — Emergency Department (HOSPITAL_COMMUNITY): Payer: Self-pay

## 2016-10-15 ENCOUNTER — Encounter: Payer: Self-pay | Admitting: Family Medicine

## 2016-10-15 DIAGNOSIS — I10 Essential (primary) hypertension: Secondary | ICD-10-CM | POA: Insufficient documentation

## 2016-10-15 DIAGNOSIS — J45909 Unspecified asthma, uncomplicated: Secondary | ICD-10-CM | POA: Insufficient documentation

## 2016-10-15 DIAGNOSIS — R51 Headache: Secondary | ICD-10-CM

## 2016-10-15 DIAGNOSIS — R42 Dizziness and giddiness: Secondary | ICD-10-CM | POA: Insufficient documentation

## 2016-10-15 DIAGNOSIS — R519 Headache, unspecified: Secondary | ICD-10-CM

## 2016-10-15 DIAGNOSIS — Z79899 Other long term (current) drug therapy: Secondary | ICD-10-CM | POA: Insufficient documentation

## 2016-10-15 DIAGNOSIS — R112 Nausea with vomiting, unspecified: Secondary | ICD-10-CM | POA: Insufficient documentation

## 2016-10-15 LAB — CBC WITH DIFFERENTIAL/PLATELET
BASOS PCT: 0 %
Basophils Absolute: 0 10*3/uL (ref 0.0–0.1)
EOS ABS: 0.1 10*3/uL (ref 0.0–0.7)
Eosinophils Relative: 2 %
HCT: 40.2 % (ref 36.0–46.0)
Hemoglobin: 13.1 g/dL (ref 12.0–15.0)
Lymphocytes Relative: 30 %
Lymphs Abs: 1.6 10*3/uL (ref 0.7–4.0)
MCH: 28.6 pg (ref 26.0–34.0)
MCHC: 32.6 g/dL (ref 30.0–36.0)
MCV: 87.8 fL (ref 78.0–100.0)
MONO ABS: 0.4 10*3/uL (ref 0.1–1.0)
MONOS PCT: 7 %
Neutro Abs: 3.3 10*3/uL (ref 1.7–7.7)
Neutrophils Relative %: 61 %
Platelets: 298 10*3/uL (ref 150–400)
RBC: 4.58 MIL/uL (ref 3.87–5.11)
RDW: 13 % (ref 11.5–15.5)
WBC: 5.4 10*3/uL (ref 4.0–10.5)

## 2016-10-15 LAB — BASIC METABOLIC PANEL
Anion gap: 6 (ref 5–15)
BUN: 11 mg/dL (ref 6–20)
CALCIUM: 8.3 mg/dL — AB (ref 8.9–10.3)
CO2: 29 mmol/L (ref 22–32)
CREATININE: 0.69 mg/dL (ref 0.44–1.00)
Chloride: 99 mmol/L — ABNORMAL LOW (ref 101–111)
GFR calc non Af Amer: >60 mL/min (ref >60)
Glucose, Bld: 85 mg/dL (ref 65–99)
Potassium: 4.2 mmol/L (ref 3.5–5.1)
SODIUM: 134 mmol/L — AB (ref 135–145)

## 2016-10-15 MED ORDER — KETOROLAC TROMETHAMINE 30 MG/ML IJ SOLN
30.0000 mg | Freq: Once | INTRAMUSCULAR | Status: AC
  Administered 2016-10-15: 30 mg via INTRAVENOUS
  Filled 2016-10-15: qty 1

## 2016-10-15 MED ORDER — SODIUM CHLORIDE 0.9 % IV SOLN
INTRAVENOUS | Status: DC
  Administered 2016-10-15: 16:00:00 via INTRAVENOUS

## 2016-10-15 MED ORDER — PROMETHAZINE HCL 25 MG/ML IJ SOLN
12.5000 mg | Freq: Once | INTRAMUSCULAR | Status: AC
  Administered 2016-10-15: 12.5 mg via INTRAVENOUS
  Filled 2016-10-15: qty 1

## 2016-10-15 MED ORDER — SODIUM CHLORIDE 0.9 % IV BOLUS (SEPSIS)
1000.0000 mL | Freq: Once | INTRAVENOUS | Status: AC
  Administered 2016-10-15: 1000 mL via INTRAVENOUS

## 2016-10-15 MED ORDER — HYDROMORPHONE HCL 1 MG/ML IJ SOLN
1.0000 mg | Freq: Once | INTRAMUSCULAR | Status: AC
  Administered 2016-10-15: 1 mg via INTRAVENOUS
  Filled 2016-10-15: qty 1

## 2016-10-15 MED ORDER — DIPHENHYDRAMINE HCL 50 MG/ML IJ SOLN
25.0000 mg | Freq: Once | INTRAMUSCULAR | Status: AC
  Administered 2016-10-15: 25 mg via INTRAVENOUS
  Filled 2016-10-15: qty 1

## 2016-10-15 MED ORDER — DEXAMETHASONE SODIUM PHOSPHATE 4 MG/ML IJ SOLN
10.0000 mg | Freq: Once | INTRAMUSCULAR | Status: AC
  Administered 2016-10-15: 10 mg via INTRAVENOUS
  Filled 2016-10-15: qty 3

## 2016-10-15 NOTE — ED Provider Notes (Signed)
[facility] DEPT Provider Note   CSN: [id number] Arrival date & time: 10/15/16  1311     History   Chief Complaint Chief Complaint  Patient presents with  . Hypertension    HPI Cynthia [name] is a 35 y.o. female.  Patient with four-day history of bilateral 4 head headache. Associated with nausea and occasional vomiting and some dizziness. Patient has a history of migraines. This is somewhat similar to past migraines but worse. Patient is on migraine medications. Patient also has a history of fibromyalgia. Patient has a history of of hypertension. Patient states the headache is 8 out of 10 and throbbing in nature.      Past Medical History:  Diagnosis Date  . Asthma    childhood  . Fibromyalgia 2007  . Hypertension    on and off since 2007    Patient Active Problem List   Diagnosis Date Noted  . Hypersomnia 07/20/2016  . Asthma 07/20/2016  . Anxiety and depression 07/06/2016  . Vitamin D deficiency 07/06/2016  . Seasonal allergies 02/15/2016  . Insomnia 02/15/2016  . Left ankle pain 02/22/2014  . Encounter for routine gynecological examination 02/22/2014  . Sinusitis nasal 02/22/2014  . Migraines 01/15/2014  . Borderline diabetes 01/13/2014  . Obesity, unspecified 01/13/2014  . Essential hypertension, benign 01/13/2014  . Fibromyalgia 01/13/2014  . Right knee pain 01/13/2014    History reviewed. No pertinent surgical history.  OB History    No data available       Home Medications    Prior to Admission medications   Medication Sig Start Date End Date Taking? Authorizing Provider  albuterol (PROVENTIL HFA;VENTOLIN HFA) 108 (90 Base) MCG/ACT inhaler Inhale 2 puffs into the lungs every 4 (four) hours as needed for wheezing or shortness of breath. 07/06/16  Yes Jaclyn ShaggyEnobong Amao, MD  cyclobenzaprine (FLEXERIL) 10 MG tablet Take 1 tablet (10 mg total) by mouth 3 (three) times daily as needed for muscle spasms. 09/28/16  Yes Jaclyn ShaggyEnobong Amao, MD  FLUoxetine  (PROZAC) 20 MG tablet Take 1 tablet (20 mg total) by mouth daily. 09/28/16  Yes Jaclyn ShaggyEnobong Amao, MD  lactulose (CHRONULAC) 10 GM/15ML solution Take 15 mLs (10 g total) by mouth 2 (two) times daily as needed for mild constipation. 07/06/16  Yes Jaclyn ShaggyEnobong Amao, MD  loratadine (CLARITIN) 10 MG tablet Take 1 tablet (10 mg total) by mouth daily. 07/06/16  Yes Jaclyn ShaggyEnobong Amao, MD  MELATONIN PO Take 10 mg by mouth at bedtime.   Yes Historical Provider, MD  mometasone (NASONEX) 50 MCG/ACT nasal spray Place 2 sprays into the nose daily. 07/06/16  Yes Jaclyn ShaggyEnobong Amao, MD  Multiple Vitamin (MULTIVITAMIN) tablet Take 1 tablet by mouth daily.   Yes Historical Provider, MD  topiramate (TOPAMAX) 100 MG tablet Take 1 tablet (100 mg total) by mouth 2 (two) times daily. 09/28/16  Yes Jaclyn ShaggyEnobong Amao, MD  traMADol (ULTRAM) 50 MG tablet Take 1 tablet (50 mg total) by mouth every 6 (six) hours as needed. 09/28/16  Yes Jaclyn ShaggyEnobong Amao, MD  Vitamin D, Ergocalciferol, (DRISDOL) 50000 units CAPS capsule Take 1 capsule (50,000 Units total) by mouth every 7 (seven) days. 07/06/16  Yes Jaclyn ShaggyEnobong Amao, MD    Family History Family History  Problem Relation Age of Onset  . Arthritis Mother   . Hypertension Mother   . Alcohol abuse Father   . Arthritis Father   . Asthma Father   . COPD Father   . Hypertension Father   . Early death Maternal Grandmother   .  Hypertension Maternal Grandmother   . Hearing loss Maternal Grandfather   . Hypertension Maternal Grandfather   . Stroke Maternal Grandfather   . Hypertension Paternal Grandmother   . Hypertension Paternal Grandfather     Social History Social History  Substance Use Topics  . Smoking status: Never Smoker  . Smokeless tobacco: Never Used  . Alcohol use No     Allergies   Patient has no known allergies.   Review of Systems Review of Systems  Constitutional: Negative for fever.  HENT: Negative for congestion.   Eyes: Negative for visual disturbance.  Respiratory: Negative  for shortness of breath.   Cardiovascular: Negative for chest pain.  Gastrointestinal: Positive for nausea and vomiting. Negative for abdominal pain.  Genitourinary: Negative for dysuria.  Musculoskeletal: Negative for back pain and neck pain.  Skin: Negative for rash.  Neurological: Positive for dizziness and headaches. Negative for syncope, speech difficulty, weakness and numbness.  Hematological: Does not bruise/bleed easily.  Psychiatric/Behavioral: Negative for confusion.     Physical Exam Updated Vital Signs BP 148/94 (BP Location: Left Arm)   Pulse 100   Temp 98 F (36.7 C) (Oral)   Resp 20   Ht 5\' 6"  (1.676 m)   Wt 133.8 kg   LMP 10/01/2016   SpO2 97%   BMI 47.61 kg/m   Physical Exam  Constitutional: She is oriented to person, place, and time. She appears well-developed and well-nourished. No distress.  HENT:  Head: Normocephalic and atraumatic.  Mouth/Throat: Oropharynx is clear and moist.  Eyes: Conjunctivae and EOM are normal. Pupils are equal, round, and reactive to light.  Neck: Normal range of motion. Neck supple.  Cardiovascular: Normal rate, regular rhythm and normal heart sounds.   Pulmonary/Chest: Effort normal and breath sounds normal. No respiratory distress.  Abdominal: Soft. Bowel sounds are normal. There is no tenderness.  Musculoskeletal: Normal range of motion. She exhibits no edema.  Neurological: She is alert and oriented to person, place, and time. No cranial nerve deficit or sensory deficit. She exhibits normal muscle tone. Coordination normal.  Skin: Skin is warm.  Nursing note and vitals reviewed.    ED Treatments / Results  Labs (all labs ordered are listed, but only abnormal results are displayed) Labs Reviewed  BASIC METABOLIC PANEL - Abnormal; Notable for the following:       Result Value   Sodium 134 (*)    Chloride 99 (*)    Calcium 8.3 (*)    All other components within normal limits  CBC WITH DIFFERENTIAL/PLATELET    EKG   EKG Interpretation None       Radiology Ct Head Wo Contrast  Result Date: 10/15/2016 CLINICAL DATA:  Headache for 4 days, vomiting today. Hypertensive. History of migraines, fibromyalgia. EXAM: CT HEAD WITHOUT CONTRAST TECHNIQUE: Contiguous axial images were obtained from the base of the skull through the vertex without intravenous contrast. COMPARISON:  None. FINDINGS: BRAIN: The ventricles and sulci are normal. No intraparenchymal hemorrhage, mass effect nor midline shift. No acute large vascular territory infarcts. No abnormal extra-axial fluid collections. Basal cisterns are patent. VASCULAR: Unremarkable. SKULL/SOFT TISSUES: No skull fracture. No significant soft tissue swelling. ORBITS/SINUSES: The included ocular globes and orbital contents are normal.The mastoid aircells and included paranasal sinuses are well-aerated. OTHER: Partially empty sella. IMPRESSION: No acute intracranial process. Partially empty sella, otherwise normal CT HEAD. Electronically Signed   By: Awilda Metro M.D.   On: 10/15/2016 17:05    Procedures Procedures (including critical care time)  Medications Ordered in ED Medications  0.9 %  sodium chloride infusion ( Intravenous Stopped 10/15/16 1849)  sodium chloride 0.9 % bolus 1,000 mL (0 mLs Intravenous Stopped 10/15/16 1710)  dexamethasone (DECADRON) injection 10 mg (10 mg Intravenous Given 10/15/16 1538)  diphenhydrAMINE (BENADRYL) injection 25 mg (25 mg Intravenous Given 10/15/16 1531)  promethazine (PHENERGAN) injection 12.5 mg (12.5 mg Intravenous Given 10/15/16 1534)  ketorolac (TORADOL) 30 MG/ML injection 30 mg (30 mg Intravenous Given 10/15/16 1821)  HYDROmorphone (DILAUDID) injection 1 mg (1 mg Intravenous Given 10/15/16 1821)     Initial Impression / Assessment and Plan / ED Course  I have reviewed the triage vital signs and the nursing notes.  Pertinent labs & imaging results that were available during my care of the patient were reviewed by  me and considered in my medical decision making (see chart for details).  Clinical Course    Patient with complaint of bilateral forehead headache for the past 4 days. Associated with some nausea and occasional vomiting dizziness. Patient has a history of migraines has had some similar to this in the past. No visual changes. No fevers. Head CT was negative. Also patient's blood pressure was elevated. Both improved with migraine cocktail to include Decadron and Phenergan Benadryl and Toradol. Top off with some hydromorphone. Patient feeling much better. Patient stable for discharge home. Work note not needed.   Final Clinical Impressions(s) / ED Diagnoses   Final diagnoses:  Acute intractable headache, unspecified headache type  Essential hypertension    New Prescriptions New Prescriptions   No medications on file     Vanetta MuldersScott Everleigh Colclasure, MD 10/15/16 Ernestina [id number]

## 2016-10-15 NOTE — ED Triage Notes (Signed)
Pt states she has had severe headache x 4 days. Pt states she has had 1 episode of emesis this am. Pt reports checking her blood pressure last night 243/123. This am pressure was down to 147/110. Pt has prior hx of migraines, takes Topimax.

## 2016-10-15 NOTE — Discharge Instructions (Signed)
Go home and rest in a dark room. Headache should be gone by morning. Return for any new or worse symptoms. Head CT negative.

## 2016-11-05 ENCOUNTER — Encounter: Payer: Self-pay | Admitting: Family Medicine

## 2016-11-05 ENCOUNTER — Other Ambulatory Visit: Payer: Self-pay | Admitting: Family Medicine

## 2016-11-05 DIAGNOSIS — M797 Fibromyalgia: Secondary | ICD-10-CM

## 2016-11-05 NOTE — Telephone Encounter (Signed)
Patient request for tramadol

## 2016-11-06 ENCOUNTER — Other Ambulatory Visit: Payer: Self-pay | Admitting: Family Medicine

## 2016-11-06 DIAGNOSIS — M797 Fibromyalgia: Secondary | ICD-10-CM

## 2016-11-06 MED ORDER — TRAMADOL HCL 50 MG PO TABS: 50.0000 mg | ORAL_TABLET | Freq: Four times a day (QID) | ORAL | 1 refills | Status: DC | PRN

## 2016-11-30 ENCOUNTER — Ambulatory Visit: Payer: BLUE CROSS/BLUE SHIELD | Attending: Family Medicine | Admitting: Family Medicine

## 2016-11-30 ENCOUNTER — Encounter: Payer: Self-pay | Admitting: Family Medicine

## 2016-11-30 VITALS — BP 112/79 | HR 104 | Temp 98.1°F | Wt 322.4 lb

## 2016-11-30 DIAGNOSIS — M25561 Pain in right knee: Secondary | ICD-10-CM | POA: Insufficient documentation

## 2016-11-30 DIAGNOSIS — R000 Tachycardia, unspecified: Secondary | ICD-10-CM | POA: Diagnosis not present

## 2016-11-30 DIAGNOSIS — G43909 Migraine, unspecified, not intractable, without status migrainosus: Secondary | ICD-10-CM | POA: Diagnosis not present

## 2016-11-30 DIAGNOSIS — F418 Other specified anxiety disorders: Secondary | ICD-10-CM | POA: Diagnosis not present

## 2016-11-30 DIAGNOSIS — K5909 Other constipation: Secondary | ICD-10-CM

## 2016-11-30 DIAGNOSIS — K5900 Constipation, unspecified: Secondary | ICD-10-CM | POA: Diagnosis not present

## 2016-11-30 DIAGNOSIS — M25562 Pain in left knee: Secondary | ICD-10-CM

## 2016-11-30 DIAGNOSIS — I10 Essential (primary) hypertension: Secondary | ICD-10-CM | POA: Diagnosis not present

## 2016-11-30 DIAGNOSIS — F32A Depression, unspecified: Secondary | ICD-10-CM

## 2016-11-30 DIAGNOSIS — G43809 Other migraine, not intractable, without status migrainosus: Secondary | ICD-10-CM | POA: Diagnosis not present

## 2016-11-30 DIAGNOSIS — J302 Other seasonal allergic rhinitis: Secondary | ICD-10-CM

## 2016-11-30 DIAGNOSIS — M797 Fibromyalgia: Secondary | ICD-10-CM | POA: Diagnosis not present

## 2016-11-30 DIAGNOSIS — G4700 Insomnia, unspecified: Secondary | ICD-10-CM | POA: Insufficient documentation

## 2016-11-30 DIAGNOSIS — F329 Major depressive disorder, single episode, unspecified: Secondary | ICD-10-CM | POA: Diagnosis present

## 2016-11-30 DIAGNOSIS — J45909 Unspecified asthma, uncomplicated: Secondary | ICD-10-CM | POA: Insufficient documentation

## 2016-11-30 DIAGNOSIS — R110 Nausea: Secondary | ICD-10-CM | POA: Diagnosis not present

## 2016-11-30 DIAGNOSIS — F419 Anxiety disorder, unspecified: Secondary | ICD-10-CM | POA: Insufficient documentation

## 2016-11-30 DIAGNOSIS — G4709 Other insomnia: Secondary | ICD-10-CM

## 2016-11-30 DIAGNOSIS — Z79899 Other long term (current) drug therapy: Secondary | ICD-10-CM | POA: Diagnosis not present

## 2016-11-30 LAB — LIPID PANEL W/REFLEX DIRECT LDL
CHOL/HDL RATIO: 2.2 ratio (ref <5.0)
Cholesterol: 117 mg/dL (ref <200)
HDL: 53 mg/dL (ref >50)
LDL-CHOLESTEROL: 54 mg/dL
Non-HDL Cholesterol (Calc): 64 mg/dL (ref <130)
TRIGLYCERIDES: 36 mg/dL (ref <150)

## 2016-11-30 LAB — COMPLETE METABOLIC PANEL WITH GFR
ALK PHOS: 67 U/L (ref 33–115)
ALT: 19 U/L (ref 6–29)
AST: 18 U/L (ref 10–30)
Albumin: 3.4 g/dL — ABNORMAL LOW (ref 3.6–5.1)
BILIRUBIN TOTAL: 0.4 mg/dL (ref 0.2–1.2)
BUN: 9 mg/dL (ref 7–25)
CO2: 30 mmol/L (ref 20–31)
Calcium: 8.6 mg/dL (ref 8.6–10.2)
Chloride: 101 mmol/L (ref 98–110)
Creat: 0.72 mg/dL (ref 0.50–1.10)
Glucose, Bld: 83 mg/dL (ref 65–99)
POTASSIUM: 4.6 mmol/L (ref 3.5–5.3)
Sodium: 134 mmol/L — ABNORMAL LOW (ref 135–146)
TOTAL PROTEIN: 7.5 g/dL (ref 6.1–8.1)

## 2016-11-30 MED ORDER — TOPIRAMATE 100 MG PO TABS: 150.0000 mg | ORAL_TABLET | Freq: Two times a day (BID) | ORAL | 5 refills | Status: DC

## 2016-11-30 MED ORDER — TRAMADOL HCL 50 MG PO TABS: 50.0000 mg | ORAL_TABLET | Freq: Four times a day (QID) | ORAL | 1 refills | Status: DC | PRN

## 2016-11-30 MED ORDER — LACTULOSE 10 GM/15ML PO SOLN: 10.0000 g | Freq: Two times a day (BID) | ORAL | 1 refills | Status: DC | PRN

## 2016-11-30 MED ORDER — TRAZODONE HCL 50 MG PO TABS: 25.0000 mg | ORAL_TABLET | Freq: Every evening | ORAL | 3 refills | Status: DC | PRN

## 2016-11-30 MED ORDER — METOPROLOL TARTRATE 25 MG PO TABS: 25.0000 mg | ORAL_TABLET | Freq: Two times a day (BID) | ORAL | 3 refills | Status: DC

## 2016-11-30 MED ORDER — LORATADINE 10 MG PO TABS: 10.0000 mg | ORAL_TABLET | Freq: Every day | ORAL | 2 refills | Status: DC

## 2016-11-30 MED ORDER — MOMETASONE FUROATE 50 MCG/ACT NA SUSP: 2.0000 | Freq: Every day | NASAL | 2 refills | Status: DC

## 2016-11-30 MED ORDER — FLUOXETINE HCL 20 MG PO TABS: 20.0000 mg | ORAL_TABLET | Freq: Every day | ORAL | 5 refills | Status: DC

## 2016-11-30 MED ORDER — ALBUTEROL SULFATE HFA 108 (90 BASE) MCG/ACT IN AERS: 2.0000 | INHALATION_SPRAY | RESPIRATORY_TRACT | 5 refills | Status: DC | PRN

## 2016-11-30 NOTE — Progress Notes (Signed)
Patient is here for HTN and Headache  Patient complains of a headache being present intermittently for the past month.  Patient has not taken medication today. Patient has not eaten today

## 2016-11-30 NOTE — Patient Instructions (Signed)
Hypertension Hypertension, commonly called high blood pressure, is when the force of blood pumping through your arteries is too strong. Your arteries are the blood vessels that carry blood from your heart throughout your body. A blood pressure reading consists of a higher number over a lower number, such as 110/72. The higher number (systolic) is the pressure inside your arteries when your heart pumps. The lower number (diastolic) is the pressure inside your arteries when your heart relaxes. Ideally you want your blood pressure below 120/80. Hypertension forces your heart to work harder to pump blood. Your arteries may become narrow or stiff. Having untreated or uncontrolled hypertension can cause heart attack, stroke, kidney disease, and other problems. What increases the risk? Some risk factors for high blood pressure are controllable. Others are not. Risk factors you cannot control include:  Race. You may be at higher risk if you are African American.  Age. Risk increases with age.  Gender. Men are at higher risk than women before age 45 years. After age 65, women are at higher risk than men. Risk factors you can control include:  Not getting enough exercise or physical activity.  Being overweight.  Getting too much fat, sugar, calories, or salt in your diet.  Drinking too much alcohol. What are the signs or symptoms? Hypertension does not usually cause signs or symptoms. Extremely high blood pressure (hypertensive crisis) may cause headache, anxiety, shortness of breath, and nosebleed. How is this diagnosed? To check if you have hypertension, your health care provider will measure your blood pressure while you are seated, with your arm held at the level of your heart. It should be measured at least twice using the same arm. Certain conditions can cause a difference in blood pressure between your right and left arms. A blood pressure reading that is higher than normal on one occasion does  not mean that you need treatment. If it is not clear whether you have high blood pressure, you may be asked to return on a different day to have your blood pressure checked again. Or, you may be asked to monitor your blood pressure at home for 1 or more weeks. How is this treated? Treating high blood pressure includes making lifestyle changes and possibly taking medicine. Living a healthy lifestyle can help lower high blood pressure. You may need to change some of your habits. Lifestyle changes may include:  Following the DASH diet. This diet is high in fruits, vegetables, and whole grains. It is low in salt, red meat, and added sugars.  Keep your sodium intake below 2,300 mg per day.  Getting at least 30-45 minutes of aerobic exercise at least 4 times per week.  Losing weight if necessary.  Not smoking.  Limiting alcoholic beverages.  Learning ways to reduce stress. Your health care provider may prescribe medicine if lifestyle changes are not enough to get your blood pressure under control, and if one of the following is true:  You are 18-59 years of age and your systolic blood pressure is above 140.  You are 60 years of age or older, and your systolic blood pressure is above 150.  Your diastolic blood pressure is above 90.  You have diabetes, and your systolic blood pressure is over 140 or your diastolic blood pressure is over 90.  You have kidney disease and your blood pressure is above 140/90.  You have heart disease and your blood pressure is above 140/90. Your personal target blood pressure may vary depending on your medical   conditions, your age, and other factors. Follow these instructions at home:  Have your blood pressure rechecked as directed by your health care provider.  Take medicines only as directed by your health care provider. Follow the directions carefully. Blood pressure medicines must be taken as prescribed. The medicine does not work as well when you skip  doses. Skipping doses also puts you at risk for problems.  Do not smoke.  Monitor your blood pressure at home as directed by your health care provider. Contact a health care provider if:  You think you are having a reaction to medicines taken.  You have recurrent headaches or feel dizzy.  You have swelling in your ankles.  You have trouble with your vision. Get help right away if:  You develop a severe headache or confusion.  You have unusual weakness, numbness, or feel faint.  You have severe chest or abdominal pain.  You vomit repeatedly.  You have trouble breathing. This information is not intended to replace advice given to you by your health care provider. Make sure you discuss any questions you have with your health care provider. Document Released: 11/05/2005 Document Revised: 04/12/2016 Document Reviewed: 08/28/2013 Elsevier Interactive Patient Education  2017 [employer] Inc[employer]  

## 2016-11-30 NOTE — Progress Notes (Signed)
Subjective:  Patient ID: Cynthia [name], female    DOB: [birth year]  Age: 36 y.o. MRN: [id number]  CC: Headache and Hypertension   HPI Towana Stenglein is a 36 year old female with a history of fibromyalgia, migraine, seasonal allergy, vitamin D deficiency, insomnia comes into the clinic for a follow-up visit.  She complains of frontal headaches with associated nausea which are uncontrolled on her current dose of Topamax. She also endorses insomnia and sleep study formed and 04/2016 was negative for sleep apnea; her insomnia is not controlled on melatonin.  Informs me that her blood pressures have been high at home in the 160s but she does not have a history of hypertension and is not on any antihypertensive. Adhering to a low sodium diet she walks for exercise every day and is attending [employer] to help with weight loss.  Complains of occasional buckling of her knee when she turns in the wrong direction and sometimes has pain; she has also heard some popping noise in her knee.  Her seasonal allergies are not controlled despite the fact that she uses a nasal spray and an antihistamine.  Past Medical History:  Diagnosis Date  . Asthma    childhood  . Fibromyalgia 2007  . Hypertension    on and off since 2007    History reviewed. No pertinent surgical history.  No Known Allergies   Outpatient Medications Prior to Visit  Medication Sig Dispense Refill  . cyclobenzaprine (FLEXERIL) 10 MG tablet Take 1 tablet (10 mg total) by mouth 3 (three) times daily as needed for muscle spasms. 60 tablet 2  . MELATONIN PO Take 10 mg by mouth at bedtime.    . Multiple Vitamin (MULTIVITAMIN) tablet Take 1 tablet by mouth daily.    . Vitamin D, Ergocalciferol, (DRISDOL) 50000 units CAPS capsule Take 1 capsule (50,000 Units total) by mouth every 7 (seven) days. 4 capsule 2  . albuterol (PROVENTIL HFA;VENTOLIN HFA) 108 (90 Base) MCG/ACT inhaler Inhale 2 puffs into the lungs every 4 (four) hours  as needed for wheezing or shortness of breath. 1 Inhaler 3  . FLUoxetine (PROZAC) 20 MG tablet Take 1 tablet (20 mg total) by mouth daily. 30 tablet 3  . lactulose (CHRONULAC) 10 GM/15ML solution Take 15 mLs (10 g total) by mouth 2 (two) times daily as needed for mild constipation. 946 mL 1  . loratadine (CLARITIN) 10 MG tablet Take 1 tablet (10 mg total) by mouth daily. 30 tablet 2  . mometasone (NASONEX) 50 MCG/ACT nasal spray Place 2 sprays into the nose daily. 17 g 2  . topiramate (TOPAMAX) 100 MG tablet Take 1 tablet (100 mg total) by mouth 2 (two) times daily. 60 tablet 3  . traMADol (ULTRAM) 50 MG tablet Take 1 tablet (50 mg total) by mouth every 6 (six) hours as needed. 30 tablet 1   No facility-administered medications prior to visit.     ROS Review of Systems  Constitutional: Negative for activity change, appetite change and fatigue.  HENT: Positive for congestion and postnasal drip. Negative for sinus pressure and sore throat.   Eyes: Negative for visual disturbance.  Respiratory: Negative for cough, chest tightness, shortness of breath and wheezing.   Cardiovascular: Negative for chest pain and palpitations.  Gastrointestinal: Negative for abdominal distention, abdominal pain and constipation.  Endocrine: Negative for polydipsia.  Genitourinary: Negative for dysuria and frequency.  Musculoskeletal:       See history of present illness   Skin: Negative for rash.  Neurological:  Positive for headaches. Negative for tremors, light-headedness and numbness.  Hematological: Does not bruise/bleed easily.  Psychiatric/Behavioral: Positive for sleep disturbance. Negative for agitation and behavioral problems.    Objective:  BP 112/79 (BP Location: Right Arm, Patient Position: Sitting, Cuff Size: Large)   Pulse (!) 104   Temp 98.1 F (36.7 C) (Oral)   Wt (!) 322 lb 6.4 oz (146.2 kg)   BMI 52.04 kg/m   BP/Weight 11/30/2016 10/15/2016 07/20/2016  Systolic BP 112 148 142    Diastolic BP 79 94 92  Wt. (Lbs) 322.4 295 315  BMI 52.04 47.61 50.84      Physical Exam  Constitutional: She is oriented to person, place, and time. She appears well-developed and well-nourished.  Morbidly obese  Cardiovascular: Normal rate, normal heart sounds and intact distal pulses.   No murmur heard. Pulmonary/Chest: Effort normal and breath sounds normal. She has no wheezes. She has no rales. She exhibits no tenderness.  Abdominal: Soft. Bowel sounds are normal. She exhibits no distension and no mass. There is no tenderness.  Musculoskeletal: Normal range of motion. She exhibits no tenderness (crepitus on flexion and extension of left knee).  Neurological: She is alert and oriented to person, place, and time.  Skin: Skin is warm and dry.  Psychiatric: She has a normal mood and affect.   CMP Latest Ref Rng & Units 10/15/2016 02/22/2016 01/13/2014  Glucose 65 - 99 mg/dL 85 80 96  BUN 6 - 20 mg/dL 11 12 13   Creatinine 0.44 - 1.00 mg/dL 1.610.69 0.960.79 0.450.83  Sodium 135 - 145 mmol/L 134(L) 137 138  Potassium 3.5 - 5.1 mmol/L 4.2 4.3 4.5  Chloride 101 - 111 mmol/L 99(L) 102 103  CO2 22 - 32 mmol/L 29 27 28   Calcium 8.9 - 10.3 mg/dL 8.3(L) 8.5(L) 8.8  Total Protein 6.1 - 8.1 g/dL - 6.6 7.1  Total Bilirubin 0.2 - 1.2 mg/dL - 0.4 0.5  Alkaline Phos 33 - 115 U/L - 62 71  AST 10 - 30 U/L - 14 13  ALT 6 - 29 U/L - 13 10    Lipid Panel     Component Value Date/Time   CHOL 107 (L) 02/22/2016 0933   TRIG 38 02/22/2016 0933   HDL 41 (L) 02/22/2016 0933   CHOLHDL 2.6 02/22/2016 0933   VLDL 8 02/22/2016 0933   LDLCALC 58 02/22/2016 0933      Assessment & Plan:   1. Anxiety and depression Stable - FLUoxetine (PROZAC) 20 MG tablet; Take 1 tablet (20 mg total) by mouth daily.  Dispense: 30 tablet; Refill: 5  2. Fibromyalgia Controlled - traMADol (ULTRAM) 50 MG tablet; Take 1 tablet (50 mg total) by mouth every 6 (six) hours as needed.  Dispense: 30 tablet; Refill: 1  3. Other  constipation Controlled High-fiber diet - lactulose (CHRONULAC) 10 GM/15ML solution; Take 15 mLs (10 g total) by mouth 2 (two) times daily as needed for mild constipation.  Dispense: 946 mL; Refill: 1  4. Chronic seasonal allergic rhinitis due to other allergen Uncontrolled Use decongestants as needed - mometasone (NASONEX) 50 MCG/ACT nasal spray; Place 2 sprays into the nose daily.  Dispense: 17 g; Refill: 2 - loratadine (CLARITIN) 10 MG tablet; Take 1 tablet (10 mg total) by mouth daily.  Dispense: 30 tablet; Refill: 2 - albuterol (PROVENTIL HFA;VENTOLIN HFA) 108 (90 Base) MCG/ACT inhaler; Inhale 2 puffs into the lungs every 4 (four) hours as needed for wheezing or shortness of breath.  Dispense: 1 Inhaler; Refill:  5  5. Other migraine without status migrainosus, not intractable Uncontrolled Will increase dose of Topamax Underlying insomnia could also be driving headache - topiramate (TOPAMAX) 100 MG tablet; Take 1.5 tablets (150 mg total) by mouth 2 (two) times daily.  Dispense: 90 tablet; Refill: 5  6. Other insomnia Uncontrolled on melatonin Sleep study is negative for sleep apnea - traZODone (DESYREL) 50 MG tablet; Take 0.5-1 tablets (25-50 mg total) by mouth at bedtime as needed for sleep.  Dispense: 30 tablet; Refill: 3  7. Acute pain of both knees Patient advised to work on losing weight Could use OTC NSAIDs  8. Essential hypertension Blood pressure is controlled in the clinic but due to reports of elevations at home I will place him on an antihypertensive which could help with her tachycardia as well. - metoprolol tartrate (LOPRESSOR) 25 MG tablet; Take 1 tablet (25 mg total) by mouth 2 (two) times daily.  Dispense: 180 tablet; Refill: 3 - COMPLETE METABOLIC PANEL WITH GFR - Lipid Panel w/reflex Direct LDL   Meds ordered this encounter  Medications  . FLUoxetine (PROZAC) 20 MG tablet    Sig: Take 1 tablet (20 mg total) by mouth daily.    Dispense:  30 tablet     Refill:  5  . traMADol (ULTRAM) 50 MG tablet    Sig: Take 1 tablet (50 mg total) by mouth every 6 (six) hours as needed.    Dispense:  30 tablet    Refill:  1  . lactulose (CHRONULAC) 10 GM/15ML solution    Sig: Take 15 mLs (10 g total) by mouth 2 (two) times daily as needed for mild constipation.    Dispense:  946 mL    Refill:  1  . mometasone (NASONEX) 50 MCG/ACT nasal spray    Sig: Place 2 sprays into the nose daily.    Dispense:  17 g    Refill:  2  . loratadine (CLARITIN) 10 MG tablet    Sig: Take 1 tablet (10 mg total) by mouth daily.    Dispense:  30 tablet    Refill:  2  . albuterol (PROVENTIL HFA;VENTOLIN HFA) 108 (90 Base) MCG/ACT inhaler    Sig: Inhale 2 puffs into the lungs every 4 (four) hours as needed for wheezing or shortness of breath.    Dispense:  1 Inhaler    Refill:  5  . topiramate (TOPAMAX) 100 MG tablet    Sig: Take 1.5 tablets (150 mg total) by mouth 2 (two) times daily.    Dispense:  90 tablet    Refill:  5    Discontinue previous dose  . metoprolol tartrate (LOPRESSOR) 25 MG tablet    Sig: Take 1 tablet (25 mg total) by mouth 2 (two) times daily.    Dispense:  180 tablet    Refill:  3  . traZODone (DESYREL) 50 MG tablet    Sig: Take 0.5-1 tablets (25-50 mg total) by mouth at bedtime as needed for sleep.    Dispense:  30 tablet    Refill:  3    Follow-up: Return in about 3 months (around 02/28/2017) for Follow-up on chronic medical conditions.   Jaclyn Shaggy MD

## 2017-01-31 ENCOUNTER — Other Ambulatory Visit: Payer: Self-pay | Admitting: Family Medicine

## 2017-01-31 DIAGNOSIS — F329 Major depressive disorder, single episode, unspecified: Secondary | ICD-10-CM

## 2017-01-31 DIAGNOSIS — F419 Anxiety disorder, unspecified: Principal | ICD-10-CM

## 2017-01-31 DIAGNOSIS — M797 Fibromyalgia: Secondary | ICD-10-CM

## 2017-02-19 ENCOUNTER — Encounter: Payer: Self-pay | Admitting: Family Medicine

## 2017-02-19 NOTE — Telephone Encounter (Signed)
Covering provider concern

## 2017-03-15 ENCOUNTER — Encounter: Payer: Self-pay | Admitting: Family Medicine

## 2017-03-15 NOTE — Telephone Encounter (Signed)
Patient request

## 2017-03-18 ENCOUNTER — Other Ambulatory Visit: Payer: Self-pay | Admitting: Family Medicine

## 2017-03-18 DIAGNOSIS — M797 Fibromyalgia: Secondary | ICD-10-CM

## 2017-03-18 MED ORDER — TRAMADOL HCL 50 MG PO TABS: 50.0000 mg | ORAL_TABLET | Freq: Four times a day (QID) | ORAL | 1 refills | Status: DC | PRN

## 2017-04-19 ENCOUNTER — Encounter: Payer: Self-pay | Admitting: Family Medicine

## 2017-04-19 NOTE — Telephone Encounter (Signed)
Patient concerns regarding gastro.

## 2017-04-20 ENCOUNTER — Encounter: Payer: Self-pay | Admitting: Family Medicine

## 2017-06-16 ENCOUNTER — Other Ambulatory Visit: Payer: Self-pay | Admitting: Family Medicine

## 2017-06-16 DIAGNOSIS — M797 Fibromyalgia: Secondary | ICD-10-CM

## 2017-07-13 ENCOUNTER — Emergency Department (HOSPITAL_COMMUNITY)
Admission: EM | Admit: 2017-07-13 | Discharge: 2017-07-14 | Disposition: A | Discharge status: Home / Self Care | Payer: BLUE CROSS/BLUE SHIELD | Attending: Emergency Medicine | Admitting: Emergency Medicine

## 2017-07-13 ENCOUNTER — Encounter (HOSPITAL_COMMUNITY): Payer: Self-pay | Admitting: *Deleted

## 2017-07-13 DIAGNOSIS — G43009 Migraine without aura, not intractable, without status migrainosus: Secondary | ICD-10-CM

## 2017-07-13 DIAGNOSIS — Z79899 Other long term (current) drug therapy: Secondary | ICD-10-CM | POA: Insufficient documentation

## 2017-07-13 DIAGNOSIS — J45909 Unspecified asthma, uncomplicated: Secondary | ICD-10-CM | POA: Insufficient documentation

## 2017-07-13 DIAGNOSIS — I10 Essential (primary) hypertension: Secondary | ICD-10-CM | POA: Insufficient documentation

## 2017-07-13 LAB — CBC WITH DIFFERENTIAL/PLATELET
Basophils Absolute: 0 10*3/uL (ref 0.0–0.1)
Basophils Relative: 0 %
EOS ABS: 0 10*3/uL (ref 0.0–0.7)
Eosinophils Relative: 0 %
HCT: 38.6 % (ref 36.0–46.0)
HEMOGLOBIN: 12.9 g/dL (ref 12.0–15.0)
Lymphocytes Relative: 18 %
Lymphs Abs: 1.1 10*3/uL (ref 0.7–4.0)
MCH: 29.7 pg (ref 26.0–34.0)
MCHC: 33.4 g/dL (ref 30.0–36.0)
MCV: 88.9 fL (ref 78.0–100.0)
Monocytes Absolute: 0.3 10*3/uL (ref 0.1–1.0)
Monocytes Relative: 5 %
NEUTROS PCT: 77 %
Neutro Abs: 4.9 10*3/uL (ref 1.7–7.7)
Platelets: 261 10*3/uL (ref 150–400)
RBC: 4.34 MIL/uL (ref 3.87–5.11)
RDW: 12.5 % (ref 11.5–15.5)
WBC: 6.3 10*3/uL (ref 4.0–10.5)

## 2017-07-13 LAB — COMPREHENSIVE METABOLIC PANEL
ALBUMIN: 3.8 g/dL (ref 3.5–5.0)
ALK PHOS: 66 U/L (ref 38–126)
ALT: 23 U/L (ref 14–54)
ANION GAP: 7 (ref 5–15)
AST: 22 U/L (ref 15–41)
BUN: 16 mg/dL (ref 6–20)
CALCIUM: 8.4 mg/dL — AB (ref 8.9–10.3)
CO2: 28 mmol/L (ref 22–32)
Chloride: 100 mmol/L — ABNORMAL LOW (ref 101–111)
Creatinine, Ser: 0.8 mg/dL (ref 0.44–1.00)
GFR calc Af Amer: >60 mL/min (ref >60)
GFR calc non Af Amer: >60 mL/min (ref >60)
GLUCOSE: 123 mg/dL — AB (ref 65–99)
Potassium: 3.8 mmol/L (ref 3.5–5.1)
SODIUM: 135 mmol/L (ref 135–145)
Total Bilirubin: 0.4 mg/dL (ref 0.3–1.2)
Total Protein: 7.9 g/dL (ref 6.5–8.1)

## 2017-07-13 MED ORDER — SODIUM CHLORIDE 0.9 % IV BOLUS (SEPSIS)
1000.0000 mL | Freq: Once | INTRAVENOUS | Status: AC
  Administered 2017-07-13: 1000 mL via INTRAVENOUS

## 2017-07-13 MED ORDER — DIPHENHYDRAMINE HCL 50 MG/ML IJ SOLN
25.0000 mg | Freq: Once | INTRAMUSCULAR | Status: AC
  Administered 2017-07-13: 25 mg via INTRAVENOUS
  Filled 2017-07-13: qty 1

## 2017-07-13 MED ORDER — METOCLOPRAMIDE HCL 5 MG/ML IJ SOLN
10.0000 mg | Freq: Once | INTRAMUSCULAR | Status: AC
  Administered 2017-07-13: 10 mg via INTRAVENOUS
  Filled 2017-07-13: qty 2

## 2017-07-13 MED ORDER — KETOROLAC TROMETHAMINE 30 MG/ML IJ SOLN
30.0000 mg | Freq: Once | INTRAMUSCULAR | Status: AC
  Administered 2017-07-14: 30 mg via INTRAVENOUS
  Filled 2017-07-13: qty 1

## 2017-07-13 NOTE — ED Triage Notes (Signed)
Pt c/o headache frontal and behind right eye that started earlier in week with n/v,

## 2017-07-13 NOTE — ED Provider Notes (Signed)
[facility] DEPT Provider Note   CSN: [id number] Arrival date & time: 07/13/17  2220     History   Chief Complaint Chief Complaint  Patient presents with  . Headache    HPI Cynthia [name] is a 36 y.o. female.  The history is provided by the patient.  Headache   This is a new problem. The current episode started more than 2 days ago. The problem occurs constantly. The problem has been gradually worsening. The pain is located in the frontal region. The pain is severe. Associated symptoms include nausea and vomiting. Pertinent negatives include no fever.  Patient with h/o migraines presents with HA and nausea/vomiting The HA is similar to prior episodes She reports low grade fever at home, took tylenol She has had blurred vision but no visual loss No focal weakness No cp/abd pain No travel No tick bites/rash   Past Medical History:  Diagnosis Date  . Asthma    childhood  . Fibromyalgia 2007  . Hypertension    on and off since 2007    Patient Active Problem List   Diagnosis Date Noted  . Hypersomnia 07/20/2016  . Asthma 07/20/2016  . Anxiety and depression 07/06/2016  . Vitamin D deficiency 07/06/2016  . Seasonal allergies 02/15/2016  . Insomnia 02/15/2016  . Left ankle pain 02/22/2014  . Encounter for routine gynecological examination 02/22/2014  . Sinusitis nasal 02/22/2014  . Migraines 01/15/2014  . Borderline diabetes 01/13/2014  . Obesity 01/13/2014  . Essential hypertension, benign 01/13/2014  . Fibromyalgia 01/13/2014  . Right knee pain 01/13/2014    History reviewed. No pertinent surgical history.  OB History    No data available       Home Medications    Prior to Admission medications   Medication Sig Start Date End Date Taking? Authorizing Provider  albuterol (PROVENTIL HFA;VENTOLIN HFA) 108 (90 Base) MCG/ACT inhaler Inhale 2 puffs into the lungs every 4 (four) hours as needed for wheezing or shortness of breath. 11/30/16   Jaclyn Shaggy,  MD  cyclobenzaprine (FLEXERIL) 10 MG tablet TAKE ONE TABLET BY MOUTH THREE TIMES DAILY AS NEEDED FOR MUSCLE SPASM 02/01/17   Jaclyn Shaggy, MD  FLUoxetine (PROZAC) 20 MG capsule TAKE ONE CAPSULE BY MOUTH ONCE DAILY 02/01/17   Jaclyn Shaggy, MD  lactulose (CHRONULAC) 10 GM/15ML solution Take 15 mLs (10 g total) by mouth 2 (two) times daily as needed for mild constipation. 11/30/16   Jaclyn Shaggy, MD  loratadine (CLARITIN) 10 MG tablet Take 1 tablet (10 mg total) by mouth daily. 11/30/16   Jaclyn Shaggy, MD  MELATONIN PO Take 10 mg by mouth at bedtime.    [provider]  metoprolol tartrate (LOPRESSOR) 25 MG tablet Take 1 tablet (25 mg total) by mouth 2 (two) times daily. 11/30/16   Jaclyn Shaggy, MD  mometasone (NASONEX) 50 MCG/ACT nasal spray Place 2 sprays into the nose daily. 11/30/16   Jaclyn Shaggy, MD  Multiple Vitamin (MULTIVITAMIN) tablet Take 1 tablet by mouth daily.    [provider]  topiramate (TOPAMAX) 100 MG tablet Take 1.5 tablets (150 mg total) by mouth 2 (two) times daily. 11/30/16   Jaclyn Shaggy, MD  traMADol (ULTRAM) 50 MG tablet Take 1 tablet (50 mg total) by mouth every 6 (six) hours as needed. 03/18/17   Jaclyn Shaggy, MD  traZODone (DESYREL) 50 MG tablet Take 0.5-1 tablets (25-50 mg total) by mouth at bedtime as needed for sleep. 11/30/16   Jaclyn Shaggy, MD  Vitamin D, Ergocalciferol, (DRISDOL)  50000 units CAPS capsule Take 1 capsule (50,000 Units total) by mouth every 7 (seven) days. 07/06/16   Jaclyn Shaggy, MD    Family History Family History  Problem Relation Age of Onset  . Arthritis Mother   . Hypertension Mother   . Alcohol abuse Father   . Arthritis Father   . Asthma Father   . COPD Father   . Hypertension Father   . Early death Maternal Grandmother   . Hypertension Maternal Grandmother   . Hearing loss Maternal Grandfather   . Hypertension Maternal Grandfather   . Stroke Maternal Grandfather   . Hypertension Paternal Grandmother   .  Hypertension Paternal Grandfather     Social History Social History  Substance Use Topics  . Smoking status: Never Smoker  . Smokeless tobacco: Never Used  . Alcohol use No     Allergies   Patient has no known allergies.   Review of Systems Review of Systems  Constitutional: Negative for fever.  Cardiovascular: Negative for chest pain.  Gastrointestinal: Positive for nausea and vomiting.  Neurological: Positive for headaches.  All other systems reviewed and are negative.    Physical Exam Updated Vital Signs BP (!) 167/99   Pulse 80   Temp (!) 97.4 F (36.3 C) (Oral)   Resp 20   Ht 1.676 m (5\' 6" )   Wt 131.5 kg (290 lb)   LMP 06/30/2017   SpO2 100%   BMI 46.81 kg/m   Physical Exam CONSTITUTIONAL: Well developed/well nourished HEAD: Normocephalic/atraumatic EYES: EOMI/PERRL, no nystagmus, no ptosis, normal fundoscopic exam (no papilledema)  ENMT: Mucous membranes moist NECK: supple no meningeal signs, no bruits SPINE/BACK:entire spine nontender CV: S1/S2 noted, no murmurs/rubs/gallops noted LUNGS: Lungs are clear to auscultation bilaterally, no apparent distress ABDOMEN: soft, nontender, no rebound or guarding GU:no cva tenderness NEURO:Awake/alert, face symmetric, no arm or leg drift is noted Equal 5/5 strength with shoulder abduction, elbow flex/extension, wrist flex/extension in upper extremities and equal hand grips bilaterally Equal 5/5 strength with hip flexion,knee flex/extension, foot dorsi/plantar flexion Cranial nerves 3/4/5/6/05/27/09/11/12 tested and intact Gait normal without ataxia No past pointing Sensation to light touch intact in all extremities EXTREMITIES: pulses normal, full ROM SKIN: warm, color normal PSYCH: no abnormalities of mood noted, alert and oriented to situation    ED Treatments / Results  Labs (all labs ordered are listed, but only abnormal results are displayed) Labs Reviewed  COMPREHENSIVE METABOLIC PANEL - Abnormal;  Notable for the following:       Result Value   Chloride 100 (*)    Glucose, Bld 123 (*)    Calcium 8.4 (*)    All other components within normal limits  CBC WITH DIFFERENTIAL/PLATELET    EKG  EKG Interpretation None       Radiology No results found.  Procedures Procedures (including critical care time)  Medications Ordered in ED Medications  metoCLOPramide (REGLAN) injection 10 mg (10 mg Intravenous Given 07/13/17 2319)  diphenhydrAMINE (BENADRYL) injection 25 mg (25 mg Intravenous Given 07/13/17 2320)  sodium chloride 0.9 % bolus 1,000 mL (1,000 mLs Intravenous New Bag/Given 07/13/17 2319)  ketorolac (TORADOL) 30 MG/ML injection 30 mg (30 mg Intravenous Given 07/14/17 0003)     Initial Impression / Assessment and Plan / ED Course  I have reviewed the triage vital signs and the nursing notes.  Pertinent labs   results that were available during my care of the patient were reviewed by me and considered in my medical decision making (see chart  for details).     On initial exam, pt was actively vomiting This resolved with reglan/benadryl Pt now improved She is awake/alert She is ambulatory Reports similar to prior episodes No fever here, doubt infectious etiology She admits this is similar to prior, doubt SAH She has episodes of HTN at times, but previous visits have had lower BP Will need recheck as outpatient 12:40 AM Pt improved BP improved (SBP 134) She is awake/alert D/c home We discussed strict ER return precautions  Final Clinical Impressions(s) / ED Diagnoses   Final diagnoses:  Migraine without aura and without status migrainosus, not intractable    New Prescriptions New Prescriptions   No medications on file     Zadie Rhine, MD 07/14/17 0041

## 2017-07-14 NOTE — Discharge Instructions (Signed)

## 2017-09-04 ENCOUNTER — Other Ambulatory Visit: Payer: Self-pay | Admitting: Family Medicine

## 2017-09-04 DIAGNOSIS — M797 Fibromyalgia: Secondary | ICD-10-CM

## 2017-09-19 ENCOUNTER — Encounter: Payer: Self-pay | Admitting: Family Medicine

## 2017-09-19 NOTE — Telephone Encounter (Signed)
Covering provider refill request.

## 2017-09-20 ENCOUNTER — Telehealth: Payer: Self-pay | Admitting: Internal Medicine

## 2017-09-20 ENCOUNTER — Telehealth: Payer: Self-pay | Admitting: Family Medicine

## 2017-09-20 ENCOUNTER — Encounter: Payer: Self-pay | Admitting: Family Medicine

## 2017-09-20 ENCOUNTER — Other Ambulatory Visit: Payer: Self-pay | Admitting: Internal Medicine

## 2017-09-20 DIAGNOSIS — M797 Fibromyalgia: Secondary | ICD-10-CM

## 2017-09-20 DIAGNOSIS — G4709 Other insomnia: Secondary | ICD-10-CM

## 2017-09-20 MED ORDER — CYCLOBENZAPRINE HCL 10 MG PO TABS: 10.0000 mg | ORAL_TABLET | Freq: Three times a day (TID) | ORAL | 0 refills | Status: DC | PRN

## 2017-09-20 MED ORDER — TRAZODONE HCL 50 MG PO TABS: 25.0000 mg | ORAL_TABLET | Freq: Every evening | ORAL | 0 refills | Status: DC | PRN

## 2017-09-20 NOTE — Telephone Encounter (Signed)
Pt called to request a refill for  °traMADol (ULTRAM) 50 MG tablet  ° please follow up °

## 2017-09-20 NOTE — Telephone Encounter (Signed)
Already refilled by Dr. Laural BenesJohnson

## 2017-09-20 NOTE — Telephone Encounter (Signed)
Pt was called and informed that OV is needed. Pt was instructed to call back on Monday to get put on walk-in schedule.

## 2017-09-20 NOTE — Telephone Encounter (Signed)
Pt called to request a refill for cyclobenzaprine (FLEXERIL) 10 MG tablet   please follow up

## 2017-12-10 ENCOUNTER — Ambulatory Visit: Payer: BLUE CROSS/BLUE SHIELD | Attending: Family Medicine | Admitting: Family Medicine

## 2017-12-10 ENCOUNTER — Encounter: Payer: Self-pay | Admitting: Family Medicine

## 2017-12-10 ENCOUNTER — Other Ambulatory Visit: Payer: Self-pay

## 2017-12-10 ENCOUNTER — Encounter: Payer: Self-pay | Admitting: Pharmacist

## 2017-12-10 VITALS — BP 149/85 | HR 85 | Temp 97.7°F | Ht 66.0 in | Wt 321.4 lb

## 2017-12-10 DIAGNOSIS — E559 Vitamin D deficiency, unspecified: Secondary | ICD-10-CM | POA: Insufficient documentation

## 2017-12-10 DIAGNOSIS — J302 Other seasonal allergic rhinitis: Secondary | ICD-10-CM

## 2017-12-10 DIAGNOSIS — F329 Major depressive disorder, single episode, unspecified: Secondary | ICD-10-CM | POA: Insufficient documentation

## 2017-12-10 DIAGNOSIS — G43809 Other migraine, not intractable, without status migrainosus: Secondary | ICD-10-CM | POA: Diagnosis not present

## 2017-12-10 DIAGNOSIS — Z79899 Other long term (current) drug therapy: Secondary | ICD-10-CM | POA: Diagnosis not present

## 2017-12-10 DIAGNOSIS — F32A Depression, unspecified: Secondary | ICD-10-CM

## 2017-12-10 DIAGNOSIS — I10 Essential (primary) hypertension: Secondary | ICD-10-CM | POA: Diagnosis not present

## 2017-12-10 DIAGNOSIS — M797 Fibromyalgia: Secondary | ICD-10-CM | POA: Insufficient documentation

## 2017-12-10 DIAGNOSIS — Z6841 Body Mass Index (BMI) 40.0 and over, adult: Secondary | ICD-10-CM | POA: Diagnosis not present

## 2017-12-10 DIAGNOSIS — G4709 Other insomnia: Secondary | ICD-10-CM | POA: Insufficient documentation

## 2017-12-10 DIAGNOSIS — M940 Chondrocostal junction syndrome [Tietze]: Secondary | ICD-10-CM | POA: Diagnosis not present

## 2017-12-10 DIAGNOSIS — K5909 Other constipation: Secondary | ICD-10-CM | POA: Diagnosis not present

## 2017-12-10 DIAGNOSIS — M542 Cervicalgia: Secondary | ICD-10-CM | POA: Diagnosis present

## 2017-12-10 DIAGNOSIS — K5900 Constipation, unspecified: Secondary | ICD-10-CM | POA: Insufficient documentation

## 2017-12-10 DIAGNOSIS — F419 Anxiety disorder, unspecified: Secondary | ICD-10-CM | POA: Diagnosis not present

## 2017-12-10 MED ORDER — CYCLOBENZAPRINE HCL 10 MG PO TABS: 10.0000 mg | ORAL_TABLET | Freq: Three times a day (TID) | ORAL | 3 refills | Status: DC | PRN

## 2017-12-10 MED ORDER — TRAMADOL HCL 50 MG PO TABS: 50.0000 mg | ORAL_TABLET | Freq: Four times a day (QID) | ORAL | 1 refills | Status: DC | PRN

## 2017-12-10 MED ORDER — FLUOXETINE HCL 20 MG PO CAPS: 20.0000 mg | ORAL_CAPSULE | Freq: Every day | ORAL | 3 refills | Status: DC

## 2017-12-10 MED ORDER — MELOXICAM 7.5 MG PO TABS: 7.5000 mg | ORAL_TABLET | Freq: Every day | ORAL | 1 refills | Status: DC | PRN

## 2017-12-10 MED ORDER — LORCASERIN HCL 10 MG PO TABS: 10.0000 mg | ORAL_TABLET | Freq: Two times a day (BID) | ORAL | 3 refills | Status: DC

## 2017-12-10 MED ORDER — POLYETHYLENE GLYCOL 3350 17 GM/SCOOP PO POWD: 17.0000 g | Freq: Every day | ORAL | 1 refills | Status: DC

## 2017-12-10 MED ORDER — TRAZODONE HCL 50 MG PO TABS: 50.0000 mg | ORAL_TABLET | Freq: Every evening | ORAL | 3 refills | Status: DC | PRN

## 2017-12-10 MED ORDER — MOMETASONE FUROATE 50 MCG/ACT NA SUSP: 2.0000 | Freq: Every day | NASAL | 3 refills | Status: DC

## 2017-12-10 MED ORDER — METOPROLOL TARTRATE 50 MG PO TABS: 50.0000 mg | ORAL_TABLET | Freq: Two times a day (BID) | ORAL | 3 refills | Status: DC

## 2017-12-10 MED ORDER — SUMATRIPTAN SUCCINATE 50 MG PO TABS: ORAL_TABLET | ORAL | 3 refills | Status: DC

## 2017-12-10 MED ORDER — TOPIRAMATE 100 MG PO TABS: 150.0000 mg | ORAL_TABLET | Freq: Two times a day (BID) | ORAL | 3 refills | Status: DC

## 2017-12-10 NOTE — Progress Notes (Signed)
PA submitted for Belviq. Pending review by [employer].

## 2017-12-10 NOTE — Patient Instructions (Signed)

## 2017-12-10 NOTE — Progress Notes (Signed)
Subjective:  Patient ID: Cynthia [name], female    DOB: 1981/04/21  Age: 37 y.o. MRN: [id number]  CC: Neck Pain and Chest Pain   HPI Cynthia [name] is a 37 year old female with a history of fibromyalgia, migraine, seasonal allergy, vitamin D deficiency, insomnia comes into the clinic for a follow-up visit.  Her migraines have been uncontrolled and she has had 2 ED visits for same despite taking her Topamax. She complains of posterior neck pain which radiates down to both shoulders and anteriorly to her chest wall.  Describes chest pressure which feels like heaviness or "someone sitting on it".  Denies associated shortness of breath, tingling in the arm, numbness. Endorses running out of her Flexeril and tramadol.  She would like something to help with weight loss as she has lost 30 pounds so far by means of exercise and dietary modifications.  She had previously planned to undergo the gastric sleeve but decided against it when her boyfriend developed a  Blood clot post surgery. She currently exercises and does water aerobics several times a week.  He has been compliant with her antihypertensive and denies any adverse effect.  Past Medical History:  Diagnosis Date  . Asthma    childhood  . Fibromyalgia 2007  . Hypertension    on and off since 2007    History reviewed. No pertinent surgical history.  No Known Allergies   Outpatient Medications Prior to Visit  Medication Sig Dispense Refill  . albuterol (PROVENTIL HFA;VENTOLIN HFA) 108 (90 Base) MCG/ACT inhaler Inhale 2 puffs into the lungs every 4 (four) hours as needed for wheezing or shortness of breath. 1 Inhaler 5  . loratadine (CLARITIN) 10 MG tablet Take 1 tablet (10 mg total) by mouth daily. 30 tablet 2  . Multiple Vitamin (MULTIVITAMIN) tablet Take 1 tablet by mouth daily.    . Vitamin D, Ergocalciferol, (DRISDOL) 50000 units CAPS capsule Take 1 capsule (50,000 Units total) by mouth every 7 (seven) days. 4 capsule 2  .  cyclobenzaprine (FLEXERIL) 10 MG tablet Take 1 tablet (10 mg total) by mouth 3 (three) times daily as needed. for muscle spams 30 tablet 0  . FLUoxetine (PROZAC) 20 MG capsule TAKE ONE CAPSULE BY MOUTH ONCE DAILY 90 capsule 0  . lactulose (CHRONULAC) 10 GM/15ML solution Take 15 mLs (10 g total) by mouth 2 (two) times daily as needed for mild constipation. 946 mL 1  . metoprolol tartrate (LOPRESSOR) 25 MG tablet Take 1 tablet (25 mg total) by mouth 2 (two) times daily. 180 tablet 3  . mometasone (NASONEX) 50 MCG/ACT nasal spray Place 2 sprays into the nose daily. 17 g 2  . topiramate (TOPAMAX) 100 MG tablet Take 1.5 tablets (150 mg total) by mouth 2 (two) times daily. 90 tablet 5  . traMADol (ULTRAM) 50 MG tablet Take 1 tablet (50 mg total) by mouth every 6 (six) hours as needed. 40 tablet 1  . traZODone (DESYREL) 50 MG tablet Take 0.5-1 tablets (25-50 mg total) by mouth at bedtime as needed for sleep. 30 tablet 0  . MELATONIN PO Take 10 mg by mouth at bedtime.     No facility-administered medications prior to visit.     ROS Review of Systems  Constitutional: Negative for activity change, appetite change and fatigue.  HENT: Negative for congestion, sinus pressure and sore throat.   Eyes: Negative for visual disturbance.  Respiratory: Negative for cough, chest tightness, shortness of breath and wheezing.   Cardiovascular: Positive for chest pain. Negative  for palpitations.  Gastrointestinal: Negative for abdominal distention, abdominal pain and constipation.  Endocrine: Negative for polydipsia.  Genitourinary: Negative for dysuria and frequency.  Musculoskeletal:       See hpi  Skin: Negative for rash.  Neurological: Negative for tremors, light-headedness and numbness.  Hematological: Does not bruise/bleed easily.  Psychiatric/Behavioral: Negative for agitation and behavioral problems.    Objective:  BP (!) 149/85   Pulse 85   Temp 97.7 F (36.5 C) (Oral)   Ht 5\' 6"  (1.676 m)   Wt  (!) 321 lb 6.4 oz (145.8 kg)   LMP 12/04/2017   SpO2 97%   BMI 51.88 kg/m   BP/Weight 12/10/2017 07/13/2017 11/30/2016  Systolic BP 149 164 112  Diastolic BP 85 116 79  Wt. (Lbs) 321.4 290 322.4  BMI 51.88 46.81 52.04      Physical Exam  Constitutional: She is oriented to person, place, and time. She appears well-developed and well-nourished.  Neck:  Tenderness on palpation of posterior neck and sternocleidomastoid bilaterally  Cardiovascular: Normal rate, normal heart sounds and intact distal pulses.  No murmur heard. Pulmonary/Chest: Effort normal and breath sounds normal. She has no wheezes. She has no rales. She exhibits tenderness (TTP of parasternal region).  Abdominal: Soft. Bowel sounds are normal. She exhibits no distension and no mass. There is no tenderness.  Musculoskeletal: Normal range of motion.  Neurological: She is alert and oriented to person, place, and time.  Skin: Skin is warm and dry.  Psychiatric: She has a normal mood and affect.     Assessment & Plan:   1. Other migraine without status migrainosus, not intractable Uncontrolled Imitrex added to regimen - topiramate (TOPAMAX) 100 MG tablet; Take 1.5 tablets (150 mg total) by mouth 2 (two) times daily.  Dispense: 90 tablet; Refill: 3 - SUMAtriptan (IMITREX) 50 MG tablet; Take 1 tab (50mg ) orally at the onset of her migraine, may repeat in 2 hours if migraine persists.  Total daily maximum dose is 200 mg  Dispense: 10 tablet; Refill: 3  2. Anxiety and depression Stable - FLUoxetine (PROZAC) 20 MG capsule; Take 1 capsule (20 mg total) by mouth daily.  Dispense: 30 capsule; Refill: 3  3. Essential hypertension Uncontrolled Dose of metoprolol increased Counseled on blood pressure goal of less than 130/80, low-sodium, DASH diet, medication compliance, 150 minutes of moderate intensity exercise per week. Discussed medication compliance, adverse effects. - metoprolol tartrate (LOPRESSOR) 50 MG tablet; Take 1  tablet (50 mg total) by mouth 2 (two) times daily.  Dispense: 60 tablet; Refill: 3  4. Fibromyalgia Uncontrolled She has been out of cyclobenzaprine and tramadol which I have refilled Advised to continue water aerobics - traMADol (ULTRAM) 50 MG tablet; Take 1 tablet (50 mg total) by mouth every 6 (six) hours as needed.  Dispense: 40 tablet; Refill: 1 - cyclobenzaprine (FLEXERIL) 10 MG tablet; Take 1 tablet (10 mg total) by mouth 3 (three) times daily as needed. for muscle spams  Dispense: 60 tablet; Refill: 3  5. Other insomnia Controlled - traZODone (DESYREL) 50 MG tablet; Take 1 tablet (50 mg total) by mouth at bedtime as needed for sleep.  Dispense: 30 tablet; Refill: 3  6. Costochondritis EKG reveals normal sinus rhythm We will place an oral NSAID - meloxicam (MOBIC) 7.5 MG tablet; Take 1 tablet (7.5 mg total) by mouth daily as needed for pain.  Dispense: 30 tablet; Refill: 1  7. Seasonal allergies Stable - mometasone (NASONEX) 50 MCG/ACT nasal spray; Place 2 sprays into  the nose daily.  Dispense: 17 g; Refill: 3  8. Other constipation Increase fiber intake - polyethylene glycol powder (GLYCOLAX/MIRALAX) powder; Take 17 g by mouth daily.  Dispense: 3350 g; Refill: 1  9. Class 3 severe obesity due to excess calories with serious comorbidity and body mass index (BMI) of 50.0 to 59.9 in adult [facility](HCC) Has lost 30 pounds so far as per patient but chart reveals stable weight Continue lifestyle modifications, moderate intensity exercise 150 minutes/week Commenced on Belviq and side effects have been discussed. - Lorcaserin HCl (BELVIQ) 10 MG TABS; Take 10 mg by mouth 2 (two) times daily.  Dispense: 60 tablet; Refill: 3   Meds ordered this encounter  Medications  . topiramate (TOPAMAX) 100 MG tablet    Sig: Take 1.5 tablets (150 mg total) by mouth 2 (two) times daily.    Dispense:  90 tablet    Refill:  3  . SUMAtriptan (IMITREX) 50 MG tablet    Sig: Take 1 tab (50mg ) orally at the  onset of her migraine, may repeat in 2 hours if migraine persists.  Total daily maximum dose is 200 mg    Dispense:  10 tablet    Refill:  3  . FLUoxetine (PROZAC) 20 MG capsule    Sig: Take 1 capsule (20 mg total) by mouth daily.    Dispense:  30 capsule    Refill:  3    Please consider 90 day supplies to promote better adherence  . metoprolol tartrate (LOPRESSOR) 50 MG tablet    Sig: Take 1 tablet (50 mg total) by mouth 2 (two) times daily.    Dispense:  60 tablet    Refill:  3    Discontinue previous dose  . traMADol (ULTRAM) 50 MG tablet    Sig: Take 1 tablet (50 mg total) by mouth every 6 (six) hours as needed.    Dispense:  40 tablet    Refill:  1  . cyclobenzaprine (FLEXERIL) 10 MG tablet    Sig: Take 1 tablet (10 mg total) by mouth 3 (three) times daily as needed. for muscle spams    Dispense:  60 tablet    Refill:  3    Please consider 90 day supplies to promote better adherence  . traZODone (DESYREL) 50 MG tablet    Sig: Take 1 tablet (50 mg total) by mouth at bedtime as needed for sleep.    Dispense:  30 tablet    Refill:  3  . mometasone (NASONEX) 50 MCG/ACT nasal spray    Sig: Place 2 sprays into the nose daily.    Dispense:  17 g    Refill:  3  . polyethylene glycol powder (GLYCOLAX/MIRALAX) powder    Sig: Take 17 g by mouth daily.    Dispense:  3350 g    Refill:  1  . Lorcaserin HCl (BELVIQ) 10 MG TABS    Sig: Take 10 mg by mouth 2 (two) times daily.    Dispense:  60 tablet    Refill:  3  . meloxicam (MOBIC) 7.5 MG tablet    Sig: Take 1 tablet (7.5 mg total) by mouth daily as needed for pain.    Dispense:  30 tablet    Refill:  1    Follow-up: Return in about 1 month (around 01/10/2018) for Complete physical exam.   Jaclyn ShaggyEnobong Amao MD

## 2017-12-19 ENCOUNTER — Encounter: Payer: Self-pay | Admitting: Family Medicine

## 2017-12-19 NOTE — Telephone Encounter (Signed)
Patient mychart response

## 2018-01-10 ENCOUNTER — Encounter: Payer: BLUE CROSS/BLUE SHIELD | Admitting: Family Medicine

## 2018-01-22 ENCOUNTER — Other Ambulatory Visit: Payer: Self-pay | Admitting: Family Medicine

## 2018-01-22 DIAGNOSIS — M797 Fibromyalgia: Secondary | ICD-10-CM

## 2018-02-10 ENCOUNTER — Other Ambulatory Visit: Payer: Self-pay | Admitting: Family Medicine

## 2018-02-10 ENCOUNTER — Encounter: Payer: Self-pay | Admitting: Family Medicine

## 2018-02-10 DIAGNOSIS — M797 Fibromyalgia: Secondary | ICD-10-CM

## 2018-02-10 DIAGNOSIS — M940 Chondrocostal junction syndrome [Tietze]: Secondary | ICD-10-CM

## 2018-02-10 MED ORDER — TRAMADOL HCL 50 MG PO TABS: 50.0000 mg | ORAL_TABLET | Freq: Two times a day (BID) | ORAL | 1 refills | Status: DC | PRN

## 2018-02-19 ENCOUNTER — Encounter: Payer: Self-pay | Admitting: Family Medicine

## 2018-04-09 IMAGING — CT CT HEAD W/O CM
3 series · 16 of 47 positions shown, 19 images · non-contrast
Comparison: None.

CLINICAL DATA: Headache for 4 days, vomiting today. Hypertensive.
History of migraines, fibromyalgia.

EXAM:
CT HEAD WITHOUT CONTRAST
TECHNIQUE: Contiguous axial images were obtained from the base of the skull
through the vertex without intravenous contrast.

[Series 2: head wo · axial · 0.40mm/px · z∈[+120,+255]mm · 10 of 33 slices shown, 13 images]
[im 3/33  brain]
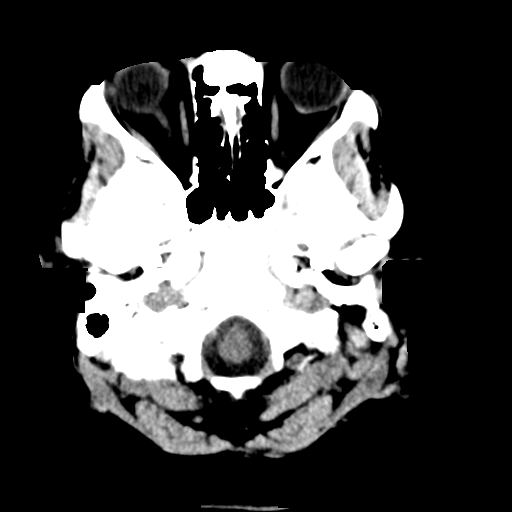
[im 3/33  bone]
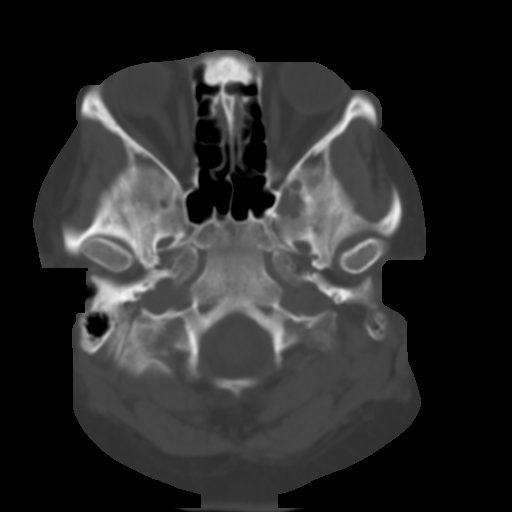
[im 6/33  brain]
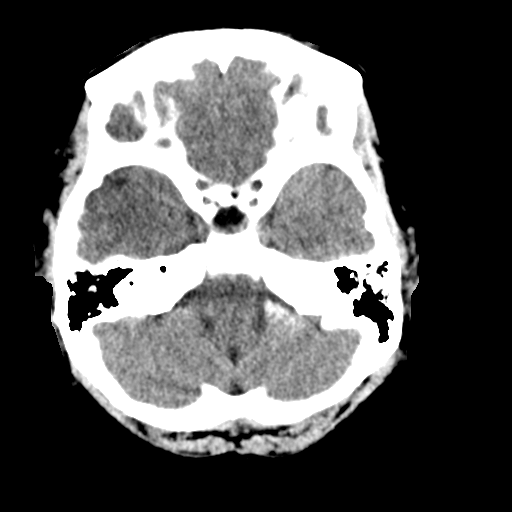
[im 9/33  brain]
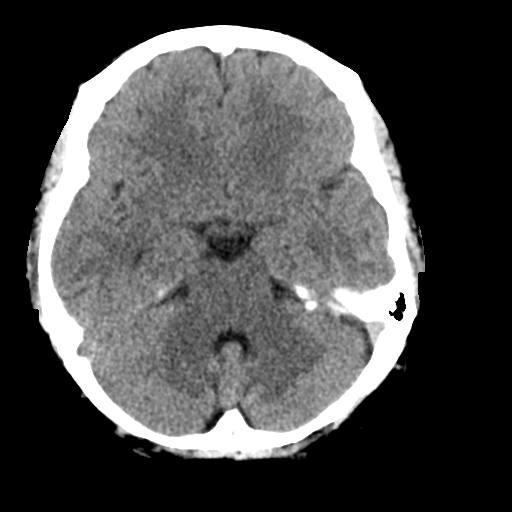
[im 12/33  brain]
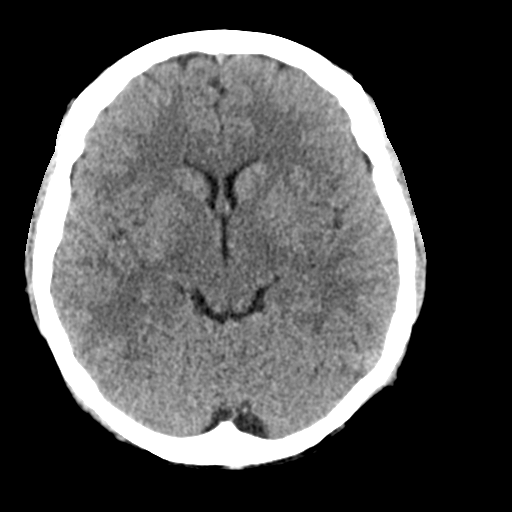
[im 15/33  brain]
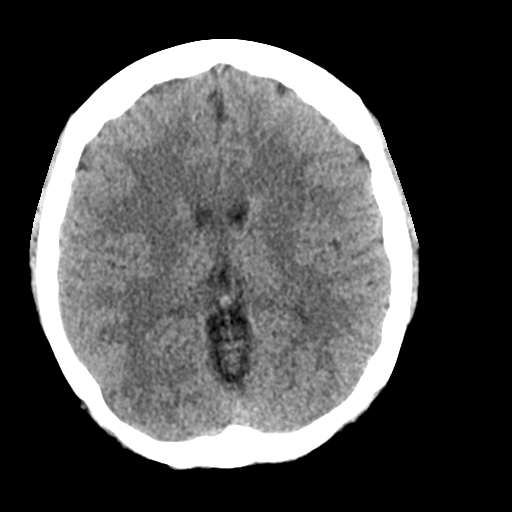
[im 15/33  bone]
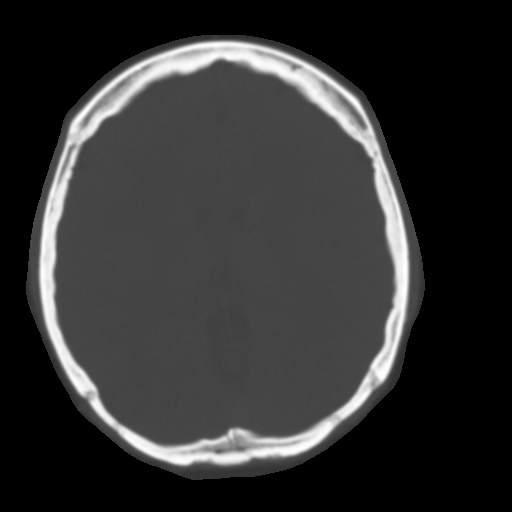
[im 18/33  brain]
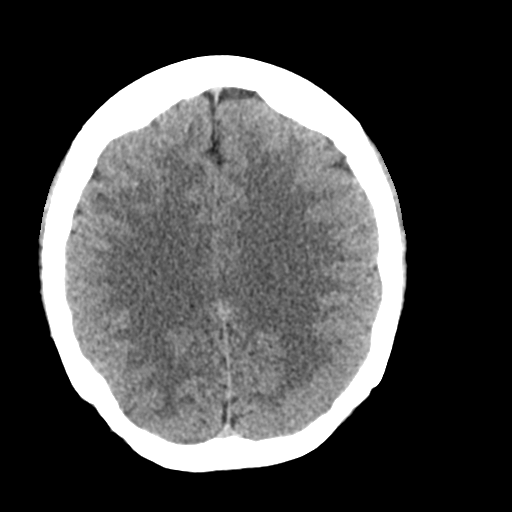
[im 21/33  brain]
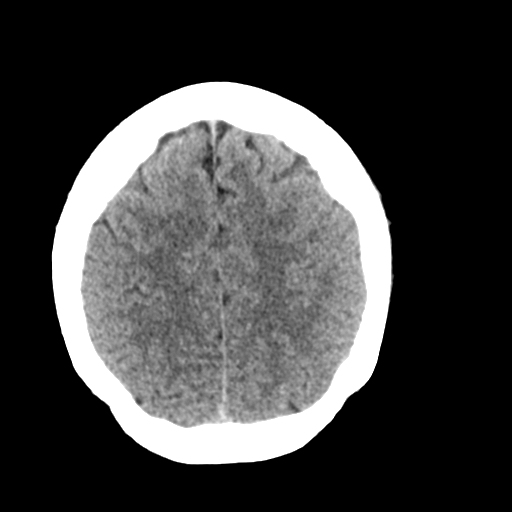
[im 25/33  brain]
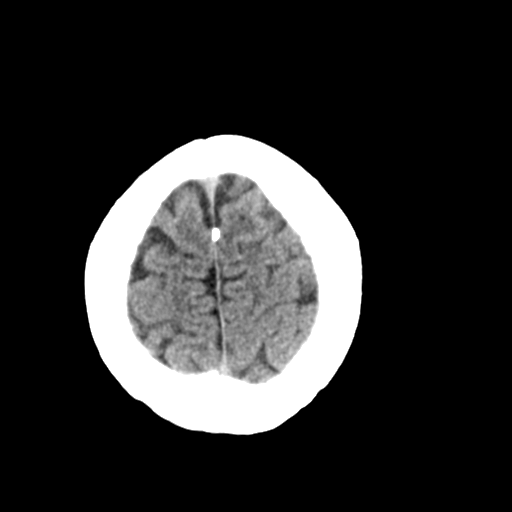
[im 27/33  brain]
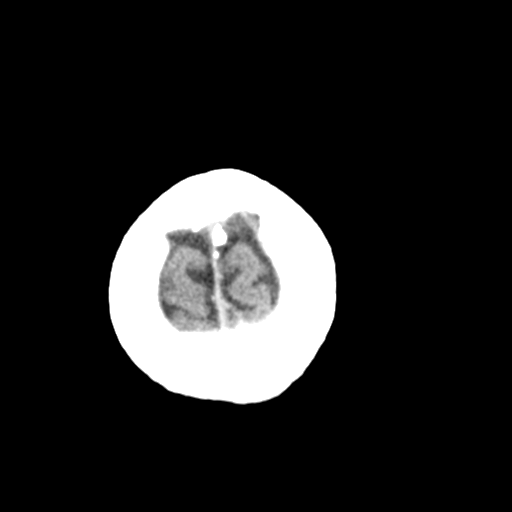
[im 27/33  bone]
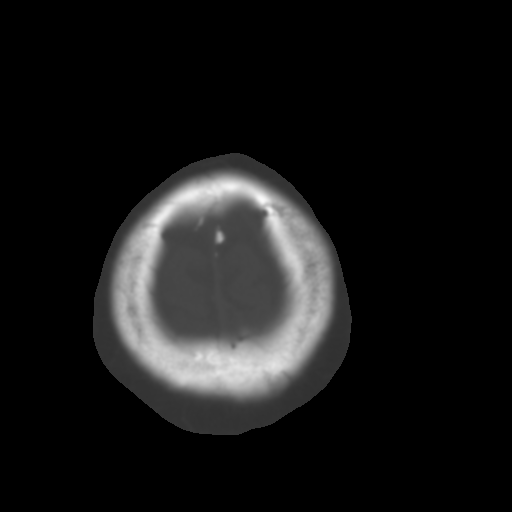
[im 30/33  brain]
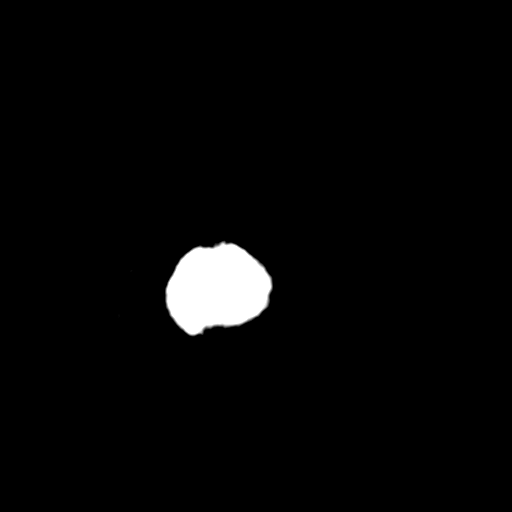

[Series 4: coronal soft tissue · coronal · 0.32mm/px · 3 of 67 slices shown]
[im 23/67  brain]
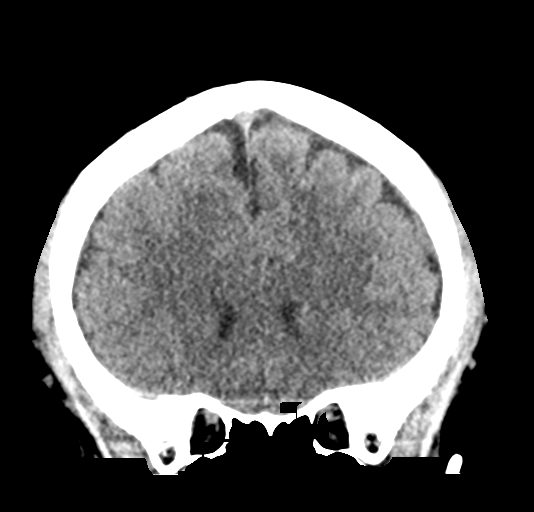
[im 30/67  brain]
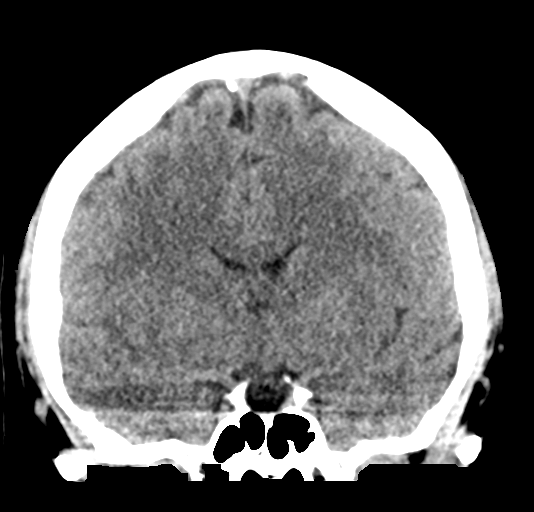
[im 37/67  brain]
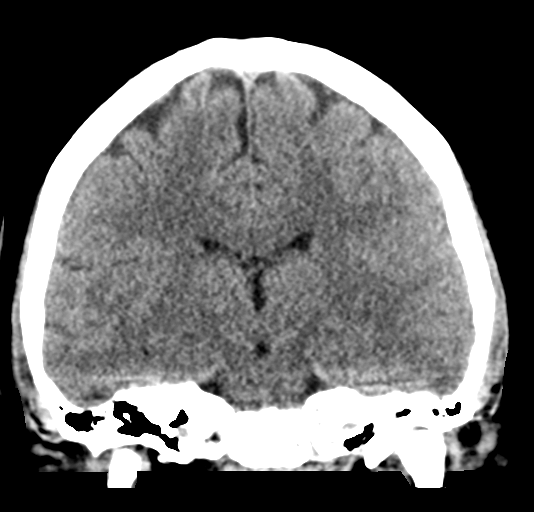

[Series 5: sagittal soft tissue · sagittal · 0.34mm/px · 3 of 59 slices shown]
[im 20/59  brain]
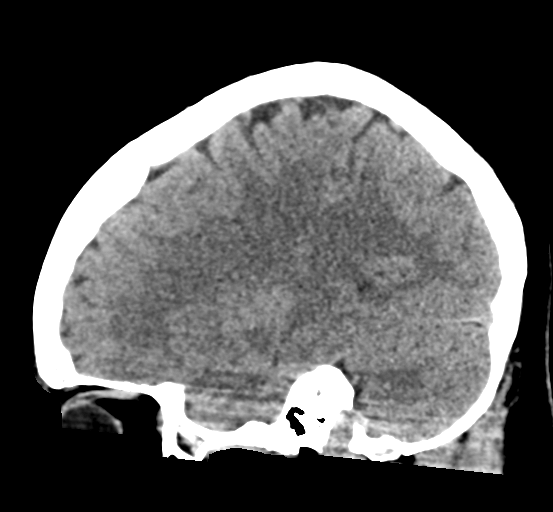
[im 30/59  brain]
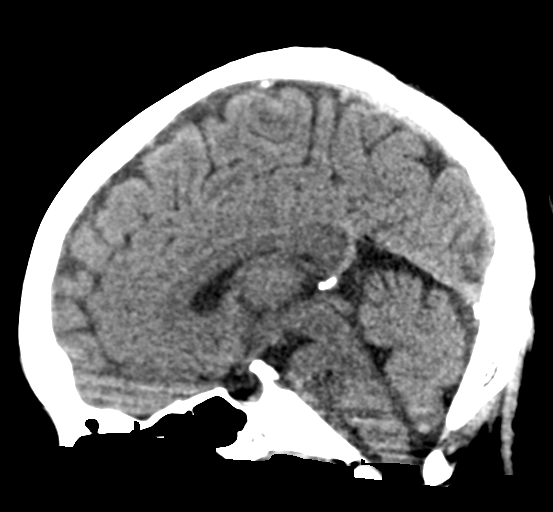
[im 39/59  brain]
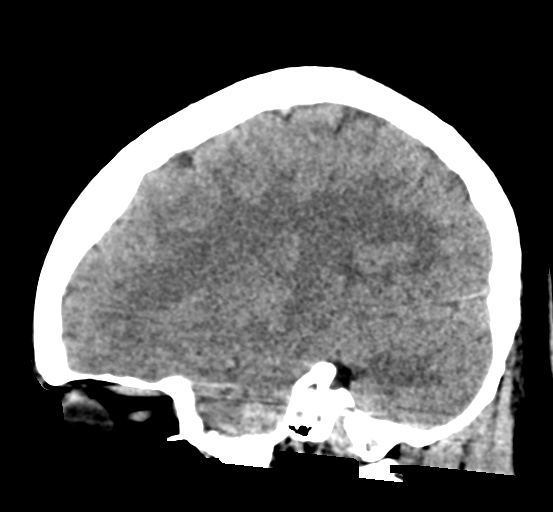

[16 of 47 positions shown; findings below may reference images not displayed]

FINDINGS: BRAIN: The ventricles and sulci are normal. No intraparenchymal
hemorrhage, mass effect nor midline shift. No acute large vascular
territory infarcts. No abnormal extra-axial fluid collections. Basal
cisterns are patent.

VASCULAR: Unremarkable.

SKULL/SOFT TISSUES: No skull fracture. No significant soft tissue
swelling.

ORBITS/SINUSES: The included ocular globes and orbital contents are
normal.The mastoid aircells and included paranasal sinuses are
well-aerated.

OTHER: Partially empty sella.
IMPRESSION: No acute intracranial process.

Partially empty sella, otherwise normal CT HEAD.

## 2018-05-29 ENCOUNTER — Other Ambulatory Visit: Payer: Self-pay | Admitting: Family Medicine

## 2018-05-29 DIAGNOSIS — M797 Fibromyalgia: Secondary | ICD-10-CM

## 2018-09-01 ENCOUNTER — Other Ambulatory Visit: Payer: Self-pay | Admitting: Family Medicine

## 2018-09-01 ENCOUNTER — Encounter: Payer: Self-pay | Admitting: Family Medicine

## 2018-09-01 DIAGNOSIS — I10 Essential (primary) hypertension: Secondary | ICD-10-CM

## 2018-09-01 DIAGNOSIS — G43809 Other migraine, not intractable, without status migrainosus: Secondary | ICD-10-CM

## 2018-09-01 DIAGNOSIS — G4709 Other insomnia: Secondary | ICD-10-CM

## 2018-09-01 DIAGNOSIS — M940 Chondrocostal junction syndrome [Tietze]: Secondary | ICD-10-CM

## 2018-09-01 DIAGNOSIS — J302 Other seasonal allergic rhinitis: Secondary | ICD-10-CM

## 2018-09-01 DIAGNOSIS — J3089 Other allergic rhinitis: Secondary | ICD-10-CM

## 2018-09-01 DIAGNOSIS — M797 Fibromyalgia: Secondary | ICD-10-CM

## 2018-09-01 DIAGNOSIS — F419 Anxiety disorder, unspecified: Secondary | ICD-10-CM

## 2018-09-01 DIAGNOSIS — F329 Major depressive disorder, single episode, unspecified: Secondary | ICD-10-CM

## 2018-09-01 MED ORDER — MELOXICAM 7.5 MG PO TABS: 7.5000 mg | ORAL_TABLET | Freq: Every day | ORAL | 0 refills | Status: DC

## 2018-09-01 MED ORDER — TRAZODONE HCL 50 MG PO TABS: 50.0000 mg | ORAL_TABLET | Freq: Every evening | ORAL | 0 refills | Status: DC | PRN

## 2018-09-01 MED ORDER — TOPIRAMATE 100 MG PO TABS: 150.0000 mg | ORAL_TABLET | Freq: Two times a day (BID) | ORAL | 0 refills | Status: DC

## 2018-09-01 MED ORDER — MOMETASONE FUROATE 50 MCG/ACT NA SUSP: 2.0000 | Freq: Every day | NASAL | 0 refills | Status: DC

## 2018-09-01 MED ORDER — SUMATRIPTAN SUCCINATE 50 MG PO TABS: ORAL_TABLET | ORAL | 0 refills | Status: DC

## 2018-09-01 MED ORDER — METOPROLOL TARTRATE 50 MG PO TABS: 50.0000 mg | ORAL_TABLET | Freq: Two times a day (BID) | ORAL | 0 refills | Status: DC

## 2018-09-01 MED ORDER — ALBUTEROL SULFATE HFA 108 (90 BASE) MCG/ACT IN AERS: 2.0000 | INHALATION_SPRAY | RESPIRATORY_TRACT | 0 refills | Status: DC | PRN

## 2018-09-01 MED ORDER — FLUOXETINE HCL 20 MG PO CAPS: 20.0000 mg | ORAL_CAPSULE | Freq: Every day | ORAL | 0 refills | Status: DC

## 2018-09-01 MED ORDER — CYCLOBENZAPRINE HCL 10 MG PO TABS: 10.0000 mg | ORAL_TABLET | Freq: Three times a day (TID) | ORAL | 0 refills | Status: DC | PRN

## 2018-09-01 NOTE — Telephone Encounter (Signed)
Patient refill request through mychart

## 2018-10-18 ENCOUNTER — Other Ambulatory Visit: Payer: Self-pay | Admitting: Family Medicine

## 2018-10-18 DIAGNOSIS — M797 Fibromyalgia: Secondary | ICD-10-CM

## 2018-10-18 DIAGNOSIS — G4709 Other insomnia: Secondary | ICD-10-CM

## 2018-10-20 ENCOUNTER — Encounter: Payer: Self-pay | Admitting: Family Medicine

## 2018-10-21 ENCOUNTER — Other Ambulatory Visit: Payer: Self-pay | Admitting: Family Medicine

## 2018-10-21 DIAGNOSIS — Z6841 Body Mass Index (BMI) 40.0 and over, adult: Principal | ICD-10-CM

## 2018-10-21 MED ORDER — LORCASERIN HCL 10 MG PO TABS: 10.0000 mg | ORAL_TABLET | Freq: Two times a day (BID) | ORAL | 0 refills | Status: DC

## 2018-10-21 NOTE — Telephone Encounter (Signed)
Mychart refill request. FU is scheduled for 11/17/18

## 2018-11-05 ENCOUNTER — Telehealth: Payer: Self-pay

## 2018-11-05 DIAGNOSIS — Z6841 Body Mass Index (BMI) 40.0 and over, adult: Principal | ICD-10-CM

## 2018-11-05 NOTE — Telephone Encounter (Signed)
Pt last seen: 12/10/17 Next appt: 11/17/18 Last RX written on: 12/10/17 Date of original fill: 12/18/17 Date of refill(s): 02/10/18, other refills are now expired.  The only other controlled medications also filled in this time frame was Tramadol 50mg  written by yourself, the last refill was on 10/18/18. Please refill if appropriate.

## 2018-11-05 NOTE — Telephone Encounter (Signed)
What is the refill request for?

## 2018-11-05 NOTE — Telephone Encounter (Signed)
Disregard, this was for Belviq and an RX was written on 10/21/18 and has not been filled yet.

## 2018-11-17 ENCOUNTER — Ambulatory Visit: Payer: BLUE CROSS/BLUE SHIELD | Admitting: Family Medicine

## 2018-11-17 ENCOUNTER — Other Ambulatory Visit: Payer: Self-pay | Admitting: Family Medicine

## 2018-11-17 ENCOUNTER — Encounter: Payer: Self-pay | Admitting: Family Medicine

## 2018-11-17 DIAGNOSIS — F32A Depression, unspecified: Secondary | ICD-10-CM

## 2018-11-17 DIAGNOSIS — J3089 Other allergic rhinitis: Secondary | ICD-10-CM

## 2018-11-17 DIAGNOSIS — F419 Anxiety disorder, unspecified: Secondary | ICD-10-CM

## 2018-11-17 DIAGNOSIS — F329 Major depressive disorder, single episode, unspecified: Secondary | ICD-10-CM

## 2018-11-17 DIAGNOSIS — G43809 Other migraine, not intractable, without status migrainosus: Secondary | ICD-10-CM

## 2018-11-17 DIAGNOSIS — M797 Fibromyalgia: Secondary | ICD-10-CM

## 2018-11-18 ENCOUNTER — Other Ambulatory Visit: Payer: Self-pay | Admitting: Family Medicine

## 2018-11-18 DIAGNOSIS — F419 Anxiety disorder, unspecified: Secondary | ICD-10-CM

## 2018-11-18 DIAGNOSIS — I10 Essential (primary) hypertension: Secondary | ICD-10-CM

## 2018-11-18 DIAGNOSIS — M940 Chondrocostal junction syndrome [Tietze]: Secondary | ICD-10-CM

## 2018-11-18 DIAGNOSIS — G43809 Other migraine, not intractable, without status migrainosus: Secondary | ICD-10-CM

## 2018-11-18 DIAGNOSIS — G4709 Other insomnia: Secondary | ICD-10-CM

## 2018-11-18 DIAGNOSIS — M797 Fibromyalgia: Secondary | ICD-10-CM

## 2018-11-18 DIAGNOSIS — F329 Major depressive disorder, single episode, unspecified: Secondary | ICD-10-CM

## 2018-11-18 MED ORDER — CYCLOBENZAPRINE HCL 10 MG PO TABS: ORAL_TABLET | ORAL | 0 refills | Status: DC

## 2018-11-18 MED ORDER — MELOXICAM 7.5 MG PO TABS: 7.5000 mg | ORAL_TABLET | Freq: Every day | ORAL | 0 refills | Status: DC

## 2018-11-18 MED ORDER — SUMATRIPTAN SUCCINATE 50 MG PO TABS: ORAL_TABLET | ORAL | 0 refills | Status: DC

## 2018-11-18 MED ORDER — METOPROLOL TARTRATE 50 MG PO TABS: 50.0000 mg | ORAL_TABLET | Freq: Two times a day (BID) | ORAL | 0 refills | Status: DC

## 2018-11-18 MED ORDER — TOPIRAMATE 100 MG PO TABS: 150.0000 mg | ORAL_TABLET | Freq: Two times a day (BID) | ORAL | 0 refills | Status: DC

## 2018-11-18 MED ORDER — TRAZODONE HCL 50 MG PO TABS: 50.0000 mg | ORAL_TABLET | Freq: Every evening | ORAL | 0 refills | Status: DC | PRN

## 2018-11-18 MED ORDER — FLUOXETINE HCL 20 MG PO CAPS: 20.0000 mg | ORAL_CAPSULE | Freq: Every day | ORAL | 0 refills | Status: DC

## 2018-12-08 ENCOUNTER — Ambulatory Visit: Payer: 59 | Attending: Family Medicine | Admitting: Family Medicine

## 2018-12-08 ENCOUNTER — Encounter: Payer: Self-pay | Admitting: Family Medicine

## 2018-12-08 VITALS — BP 137/87 | HR 64 | Temp 98.4°F | Resp 18 | Ht 66.0 in | Wt 321.0 lb

## 2018-12-08 DIAGNOSIS — I10 Essential (primary) hypertension: Secondary | ICD-10-CM

## 2018-12-08 DIAGNOSIS — M25561 Pain in right knee: Secondary | ICD-10-CM

## 2018-12-08 DIAGNOSIS — G479 Sleep disorder, unspecified: Secondary | ICD-10-CM

## 2018-12-08 DIAGNOSIS — Z6841 Body Mass Index (BMI) 40.0 and over, adult: Secondary | ICD-10-CM

## 2018-12-08 DIAGNOSIS — M797 Fibromyalgia: Secondary | ICD-10-CM

## 2018-12-08 DIAGNOSIS — R0981 Nasal congestion: Secondary | ICD-10-CM | POA: Diagnosis not present

## 2018-12-08 DIAGNOSIS — H6122 Impacted cerumen, left ear: Secondary | ICD-10-CM

## 2018-12-08 DIAGNOSIS — G8929 Other chronic pain: Secondary | ICD-10-CM

## 2018-12-08 DIAGNOSIS — R0609 Other forms of dyspnea: Secondary | ICD-10-CM

## 2018-12-08 DIAGNOSIS — Z79899 Other long term (current) drug therapy: Secondary | ICD-10-CM

## 2018-12-08 DIAGNOSIS — E010 Iodine-deficiency related diffuse (endemic) goiter: Secondary | ICD-10-CM

## 2018-12-08 DIAGNOSIS — Z23 Encounter for immunization: Secondary | ICD-10-CM | POA: Diagnosis not present

## 2018-12-08 DIAGNOSIS — R5383 Other fatigue: Secondary | ICD-10-CM

## 2018-12-08 NOTE — Progress Notes (Signed)
Subjective:    Patient ID: Cynthia [name], female    DOB: 04-10-1981, 38 y.o.   MRN: [id number]  HPI       38 yo female new to me as a patient.  Patient was last seen in the office on 12/10/2017 by another provider.  Patient reports past medical history significant for fibromyalgia, migraines, hypertension and palpitations.  At today's visit, patient states that she has been having pain in her right knee for the past 8 months.  Patient states that she initially injured her knee at the age of 1 when she fell off a deck and since that time she would have occasional knee pain that would go away.  Patient states that for the past 8 months she has knee pain, occasional swelling of the right knee and feels at times that her knee gives away.  Patient states that sometimes after sleeping or after sitting for a while her knee will feel stiff when she tries to get up and walk.      Patient reports a history of hypertension and palpitations for which she is taking metoprolol and she states that this is helped to decrease palpitations and she does not really feel palpitations anymore.  Patient denies headaches or dizziness related to her blood pressure.  Patient also takes Topamax to help prevent migraine headaches.  Patient reports that she is on tramadol for fibromyalgia.  Patient states that she has issues with generalized muscle aches and fatigue.      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  Patient states that she stays very busy.  Patient however has noticed recently that she gets short of breath when she is walking or moving quickly.  Patient denies any chest pain.  Patient does not feel that she has had any increase in peripheral edema though she does get some swelling in her legs as the day progresses.  Patient at times feels that her sleep is not restful.  Patient believes that she does likely snore.  Patient also with complaint of chronic nasal congestion and has difficulty breathing  through her nose at night.  Patient is not currently using her prescription nasal spray.  Patient also at times feels as if she has decreased hearing out of her left ear.  Patient reports no past surgeries.  Patient reports no cigarette use and no alcohol use.      She reports a family history of all 4 grandparents having hypertension.  Patient's maternal grandfather and paternal grandmother/grandfather with diabetes.  Patient's paternal grandfather also had a stroke.  Patient's maternal grandmother had heart disease and died from a heart attack.  Patient reports that her father had COPD, CHF and hypertension and that her mother also has hypertension.  Patient also reports that her father had issues with alcohol abuse.   Past Medical History:  Diagnosis Date  . Arthritis   . Asthma    childhood  . Asthma   . Fibromyalgia 2007  . Hypertension    on and off since 2007   Family History  Problem Relation Age of Onset  . Arthritis Mother   . Hypertension Mother   . Alcohol abuse Father   . Arthritis Father   . Asthma Father   . COPD Father   . Hypertension Father   . Heart failure Father   . Early death Maternal Grandmother   . Hypertension Maternal Grandmother   . Pneumonia Maternal Grandmother   . Hearing loss  Maternal Grandfather   . Hypertension Maternal Grandfather   . Stroke Maternal Grandfather   . Hypertension Paternal Grandmother   . Heart disease Paternal Grandmother   . Hypertension Paternal Grandfather   . Heart failure Paternal Grandfather    Social History   Tobacco Use  . Smoking status: Never Smoker  . Smokeless tobacco: Never Used  Substance Use Topics  . Alcohol use: No  . Drug use: No  No Known Allergies    Review of Systems  Constitutional: Positive for fatigue. Negative for chills and fever.  HENT: Positive for hearing loss (decreased hearing in left ear). Negative for ear discharge and ear pain.   Respiratory: Positive for shortness of breath.  Negative for cough.   Cardiovascular: Negative for chest pain, palpitations and leg swelling.  Gastrointestinal: Negative for abdominal pain, blood in stool, constipation, diarrhea and nausea.  Endocrine: Negative for cold intolerance, heat intolerance, polydipsia, polyphagia and polyuria.  Genitourinary: Negative for dysuria and frequency.  Musculoskeletal: Positive for arthralgias, gait problem and joint swelling.  Neurological: Negative for dizziness and headaches.  Hematological: Negative for adenopathy. Does not bruise/bleed easily.       Objective:   Physical Exam BP 137/87 (BP Location: Left Arm, Patient Position: Sitting, Cuff Size: Large)   Pulse 64   Temp 98.4 F (36.9 C) (Oral)   Resp 18   Ht 5\' 6"  (1.676 m)   Wt (!) 321 lb (145.6 kg)   LMP 11/07/2018   SpO2 97%   BMI 51.81 kg/m Nurse's notes and vital signs reviewed  General-well-nourished well-developed morbidly obese female in no acute distress ENT- right TM is dull, left TM is obscured by dark, impacted cerumen.  Patient with moderate edema of the nasal turbinates causing narrowing of the nasal passages, patient with narrowed posterior airway secondary to body habitus and patient with large tongue base. Neck-supple, no appreciable lymphadenopathy, no carotid bruit, patient with large neck size with widened anterior mid neck suggestive of thyromegaly versus anterior neck fat padding Lungs-clear to auscultation bilaterally with some decrease in breath sounds at the lung bases likely due to body habitus.  Patient's breathing is unlabored Cardiovascular-regular rate and regular rhythm Abdomen- truncal obesity, abdomen is soft and nontender Back-no CVA tenderness Extremities-patient with mild, nonpitting distal lower extremity edema Musculoskeletal- patient with tenderness to palpation along the anterior mid knee/patella as well as discomfort with palpation along the medial and lateral joint line of the right knee knee joint  is otherwise stable      Assessment & Plan:  1. Chronic pain of right knee Patient with complaint of chronic right knee pain for the past 8 months with past knee injury at the age of 1 after falling off a deck at her home.  Patient will be referred to orthopedics for further evaluation and treatment of her chronic knee pain. - Ambulatory referral to Orthopedic Surgery  2. Dyspnea on exertion Patient with complaint of shortness of breath with exertion.  Patient is morbidly obese and has had a history of hypertension and concern is that patient's dyspnea may be related to CHF/cardiomyopathy.  Patient will have BMP and CBC at today's visit in follow-up of her dyspnea as well - Basic Metabolic Panel - CBC with Differential - Ambulatory referral to Cardiology  3. Chronic nasal congestion Patient with complaint of chronic nasal congestion and this may be contributing to her sensation of inability to breathe well at night.  Patient requests referral to ENT which will be placed.  Patient is also encouraged to resume the use of her Nasonex nasal spray - Ambulatory referral to ENT  4. Sleep disturbance Patient with narrowed posterior airway and large neck size on exam in addition to morbid obesity and additionally has complaint of non-restful sleep, daytime fatigue and snoring.  Discussed with patient that she likely has sleep apnea and patient agrees to be referred for split-night sleep study to help determine if she does have sleep apnea and if appliance such as CPAP is needed - Split night study; Future  5. Fatigue, unspecified type Patient with complaint of fatigue and shortness of breath with exertion.  Will check electrolytes/BMP to see if she might have elevated blood sugar or electrolyte abnormality, check TSH for hypothyroidism as a cause of her anemia, check CBC to look for possible anemia or other blood disorder which may be contributing to fatigue.  Patient will also be referred for sleep  study as well as cardiology referral to evaluate for possible CHF/cardiomyopathy. - Basic Metabolic Panel - TSH - Split night study; Future - CBC with Differential - Ambulatory referral to Cardiology  6. Need for immunization against influenza Patient was offered and agreed to have influenza immunization at today's visit.  Patient was also given educational handout regarding influenza immunization - Flu Vaccine QUAD 36+ mos IM  7. Thyromegaly Patient with thyromegaly and large neck size on examination and patient will have TSH - TSH  8. Essential hypertension Patient with hypertension and history of palpitations for which she is currently on metoprolol.  Patient also with morbid obesity reports recent dyspnea on examination and does have some mild peripheral edema.  Patient is being referred to cardiology for further evaluation - Lipid panel - Ambulatory referral to Cardiology  9. Class 3 severe obesity due to excess calories with serious comorbidity and body mass index (BMI) of 50.0 to 59.9 in adult [facility](HCC) Discussed the need for weight loss through dietary changes and exercise.  Patient will have TSH to look for possible hypothyroidism as a cause or contributing factor to her obesity.  Patient will also have sleep study as she is at risk of sleep apnea due to her obesity and patient will have lipid panel as she is at risk for lipid disorder secondary to hypertension and diabetes - Lipid panel  10. Encounter for long-term (current) use of medications Patient will have BMP and TSH in follow-up of long-term use of medications. - TSH - BMP - Ambulatory referral to Cardiology  11. Fibromyalgia Patient with complaint of reports prior diagnosis of fibromyalgia which she states causes her to have generalized pain and fatigue.  Patient will have TSH done to rule out hypothyroidism as a cause or contributing factor to patient's fatigue and generalized pain - TSH  12. Impacted cerumen of left  ear Patient with some complaint of decreased hearing as well as chronic nasal congestion and patient with presence of impacted cerumen in the left ear.  Patient will be referred to ENT regarding chronic nasal congestion as well as for the need for cerumen removal - Ambulatory referral to ENT  An After Visit Summary was printed and given to the patient.  Return in about 4 weeks (around 01/05/2019) for fatigue/multiple issues in 4 weeks with PCP.

## 2018-12-09 LAB — CBC WITH DIFFERENTIAL/PLATELET
Basophils Absolute: 0.1 x10E3/uL (ref 0.0–0.2)
Basos: 1 %
EOS (ABSOLUTE): 0.1 x10E3/uL (ref 0.0–0.4)
Eos: 2 %
Hematocrit: 41.7 % (ref 34.0–46.6)
Hemoglobin: 13.6 g/dL (ref 11.1–15.9)
Immature Grans (Abs): 0 x10E3/uL (ref 0.0–0.1)
Immature Granulocytes: 0 %
Lymphocytes Absolute: 2.3 x10E3/uL (ref 0.7–3.1)
Lymphs: 52 %
MCH: 28.7 pg (ref 26.6–33.0)
MCHC: 32.6 g/dL (ref 31.5–35.7)
MCV: 88 fL (ref 79–97)
Monocytes Absolute: 0.4 x10E3/uL (ref 0.1–0.9)
Monocytes: 8 %
Neutrophils Absolute: 1.6 x10E3/uL (ref 1.4–7.0)
Neutrophils: 37 %
Platelets: 321 x10E3/uL (ref 150–450)
RBC: 4.74 x10E6/uL (ref 3.77–5.28)
RDW: 13.3 % (ref 11.7–15.4)
WBC: 4.4 x10E3/uL (ref 3.4–10.8)

## 2018-12-09 LAB — BASIC METABOLIC PANEL WITH GFR
BUN/Creatinine Ratio: 15 (ref 9–23)
BUN: 13 mg/dL (ref 6–20)
CO2: 21 mmol/L (ref 20–29)
Calcium: 8.8 mg/dL (ref 8.7–10.2)
Chloride: 102 mmol/L (ref 96–106)
Creatinine, Ser: 0.88 mg/dL (ref 0.57–1.00)
GFR calc Af Amer: 96 mL/min/1.73
GFR calc non Af Amer: 83 mL/min/1.73
Glucose: 83 mg/dL (ref 65–99)
Potassium: 4.4 mmol/L (ref 3.5–5.2)
Sodium: 137 mmol/L (ref 134–144)

## 2018-12-09 LAB — LIPID PANEL
Chol/HDL Ratio: 3.2 ratio (ref 0.0–4.4)
Cholesterol, Total: 123 mg/dL (ref 100–199)
HDL: 39 mg/dL — ABNORMAL LOW
LDL Calculated: 69 mg/dL (ref 0–99)
Triglycerides: 75 mg/dL (ref 0–149)
VLDL Cholesterol Cal: 15 mg/dL (ref 5–40)

## 2018-12-09 LAB — TSH: TSH: 2.11 u[IU]/mL (ref 0.450–4.500)

## 2018-12-10 ENCOUNTER — Telehealth: Payer: Self-pay | Admitting: *Deleted

## 2018-12-10 NOTE — Telephone Encounter (Signed)
Patient verified DOB Patient is aware of labs being normal and no further questions. 

## 2018-12-10 NOTE — Telephone Encounter (Signed)
-----   Message from Cain Saupe, MD sent at 12/10/2018  8:46 AM EST ----- Please notify patient of normal BMP, normal TSH, normal CBC and normal lipid panel

## 2018-12-12 ENCOUNTER — Ambulatory Visit (INDEPENDENT_AMBULATORY_CARE_PROVIDER_SITE_OTHER): Payer: 59

## 2018-12-12 ENCOUNTER — Ambulatory Visit (INDEPENDENT_AMBULATORY_CARE_PROVIDER_SITE_OTHER): Payer: 59 | Admitting: Orthopaedic Surgery

## 2018-12-12 ENCOUNTER — Encounter (INDEPENDENT_AMBULATORY_CARE_PROVIDER_SITE_OTHER): Payer: Self-pay | Admitting: Physician Assistant

## 2018-12-12 DIAGNOSIS — Z6841 Body Mass Index (BMI) 40.0 and over, adult: Secondary | ICD-10-CM | POA: Diagnosis not present

## 2018-12-12 DIAGNOSIS — M222X1 Patellofemoral disorders, right knee: Secondary | ICD-10-CM

## 2018-12-12 DIAGNOSIS — M2241 Chondromalacia patellae, right knee: Secondary | ICD-10-CM | POA: Diagnosis not present

## 2018-12-12 MED ORDER — METHYLPREDNISOLONE ACETATE 40 MG/ML IJ SUSP
40.0000 mg | INTRAMUSCULAR | Status: AC | PRN
  Administered 2018-12-12: 40 mg via INTRA_ARTICULAR

## 2018-12-12 MED ORDER — LIDOCAINE HCL 1 % IJ SOLN
2.0000 mL | INTRAMUSCULAR | Status: AC | PRN
  Administered 2018-12-12: 2 mL

## 2018-12-12 MED ORDER — BUPIVACAINE HCL 0.25 % IJ SOLN
2.0000 mL | INTRAMUSCULAR | Status: AC | PRN
  Administered 2018-12-12: 2 mL via INTRA_ARTICULAR

## 2018-12-12 NOTE — Progress Notes (Signed)
Office Visit Note   Patient: Cynthia [name]           Date of Birth: July 02, 1981           MRN: [id number] Visit Date: 12/12/2018              Requested by: Cain Saupe, MD [address] [place], [place] [address] PCP: Cain Saupe, MD   Assessment & Plan: Visit Diagnoses:  1. Chondromalacia patellae, right knee   2. Morbid obesity (HCC)   3. Body mass index 50.0-59.9, adult (HCC)   4. Patellofemoral syndrome of right knee     Plan: Impression is right knee chondromalacia patella with underlying patellofemoral syndrome.  I did inject the right knee with cortisone today.  Of also reinforced the need to lose weight.  She is currently on weight watchers and exercising as much as possible.  We will start her in physical therapy to work on quad strengthening as well as a exercise program I think she should do for her condition.  I think a PSO brace will make things worse due to her tethering.  She will follow-up with [place] as needed.  The patient meets the AMA guidelines for Morbid (severe) obesity with a BMI > 40.0 and I have recommended weight loss.   Follow-Up Instructions: Return if symptoms worsen or fail to improve.   Orders:  Orders Placed This Encounter  Procedures  . Large Joint Inj: R knee  . XR KNEE 3 VIEW RIGHT   No orders of the defined types were placed in this encounter.     Procedures: Large Joint Inj: R knee on 12/12/2018 2:06 PM Indications: pain Details: 22 G needle, anterolateral approach Medications: 2 mL lidocaine 1 %; 2 mL bupivacaine 0.25 %; 40 mg methylPREDNISolone acetate 40 MG/ML      Clinical Data: No additional findings.   Subjective: Chief Complaint  Patient presents with  . Right Knee - Pain    HPI patient is a pleasant 38 year old female who presents our clinic today with right knee pain.  This has been ongoing for the past 6 months and is progressively worsened.  No new injury or change in activity.  She does admit to falling off  the side of the house for about 10 feet when she was a kid.  She does not know what exactly she did but does note that she had to wear a knee immobilizer and use crutches.  She does note that she never had surgery.  She now works as an Sport and exercise psychologist and does a lot of walking and climbing stairs and ladders.  Her pain appears to be to the anterior aspect worse with these activities.  She has tried ice and heat as well as over-the-counter medications without relief of symptoms.  She is morbidly obese but has lost 70 pounds over the past several months.  No previous cortisone injection to the right knee.  Review of Systems as detailed in HPI.  All others reviewed and are negative.   Objective: Vital Signs: There were no vitals taken for this visit.  Physical Exam well-developed and well-nourished female in no acute distress.  Alert and oriented x3.  Ortho Exam examination of the right knee shows a markedly increased Q angle.  She does have patella tracking and tethering.  Range of motion 0 to 90 degrees.  Lateral joint line tenderness.  Moderate patellofemoral crepitus.  She is neurovascularly intact distally.  Specialty Comments:  No specialty comments available.  Imaging: Xr Knee 3 View Right  Result Date: 12/12/2018 X-rays of the right knee show marked patellofemoral changes and lateral tracking of the patella    PMFS History: Patient Active Problem List   Diagnosis Date Noted  . Body mass index 50.0-59.9, adult (HCC) 12/12/2018  . 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 Constipation 12/10/2017  . Hypersomnia 07/20/2016  . Asthma 07/20/2016  . Anxiety and depression 07/06/2016  . Vitamin D deficiency 07/06/2016  . Seasonal allergies 02/15/2016  . Insomnia 02/15/2016  . Left ankle pain 02/22/2014  . Encounter for routine gynecological examination 02/22/2014  . Sinusitis nasal 02/22/2014  . Migraines 01/15/2014  . Borderline diabetes 01/13/2014  . Morbid  obesity (HCC) 01/13/2014  . Essential hypertension, benign 01/13/2014  . Fibromyalgia 01/13/2014  . Chronic pain of right knee 01/13/2014   Past Medical History:  Diagnosis Date  . Asthma    childhood  . Fibromyalgia 2007  . Hypertension    on and off since 2007    Family History  Problem Relation Age of Onset  . Arthritis Mother   . Hypertension Mother   . Alcohol abuse Father   . Arthritis Father   . Asthma Father   . COPD Father   . Hypertension Father   . Early death Maternal Grandmother   . Hypertension Maternal Grandmother   . Hearing loss Maternal Grandfather   . Hypertension Maternal Grandfather   . Stroke Maternal Grandfather   . Hypertension Paternal Grandmother   . Hypertension Paternal Grandfather     History reviewed. No pertinent surgical history. Social History   Occupational History  . Not on file  Tobacco Use  . Smoking status: Never Smoker  . Smokeless tobacco: Never Used  Substance and Sexual Activity  . Alcohol use: No  . Drug use: No  . Sexual activity: Yes    Birth control/protection: None

## 2018-12-16 ENCOUNTER — Encounter: Payer: Self-pay | Admitting: Family Medicine

## 2018-12-18 ENCOUNTER — Ambulatory Visit (INDEPENDENT_AMBULATORY_CARE_PROVIDER_SITE_OTHER): Payer: 59 | Admitting: Cardiology

## 2018-12-18 ENCOUNTER — Encounter: Payer: Self-pay | Admitting: Cardiology

## 2018-12-18 VITALS — BP 134/90 | HR 78 | Ht 66.0 in | Wt 326.0 lb

## 2018-12-18 DIAGNOSIS — R0683 Snoring: Secondary | ICD-10-CM

## 2018-12-18 DIAGNOSIS — R0609 Other forms of dyspnea: Secondary | ICD-10-CM | POA: Diagnosis not present

## 2018-12-18 DIAGNOSIS — I10 Essential (primary) hypertension: Secondary | ICD-10-CM | POA: Diagnosis not present

## 2018-12-18 DIAGNOSIS — Z6841 Body Mass Index (BMI) 40.0 and over, adult: Secondary | ICD-10-CM

## 2018-12-18 NOTE — Progress Notes (Signed)
Cardiology Office Note   Date:  12/18/2018   ID:  Cynthia [name], DOB Jul 01, 1981, MRN [id number]  PCP:  Cain Saupe, MD  Cardiologist:   Lacrisha Bielicki [place], MD   Chief Complaint  Patient presents with  . Shortness of Breath      History of Present Illness: Cynthia [name] is a 38 y.o. female who is seen at the request of Cammie Fulp MD for evaluation of dyspnea and fatigue. She has a history of HTN. She is morbidly obese. She reports that for the last 6 months she has increased dyspnea on exertion. She thought she was out of shape and tried to exercise more but it hasn't gotten any better. She has lost 50 lbs this year. Denies any edema. Does note some orthopnea. In general she sleeps poorly and has been told she snores a lot. She does walk 3 days a week for 2 miles. She has been on metoprolol for over a year. She has a history of asthma in childhood but this hasn't been active. She works in a [employer] and finds it more challenging to set up due to her breathing. No chest pain.     Past Medical History:  Diagnosis Date  . Arthritis   . Asthma    childhood  . Asthma   . Fibromyalgia 2007  . Hypertension    on and off since 2007    History reviewed. No pertinent surgical history.   Current Outpatient Medications  Medication Sig Dispense Refill  . albuterol (PROVENTIL HFA;VENTOLIN HFA) 108 (90 Base) MCG/ACT inhaler INHALE 2 PUFFS INTO THE LUNGS EVERY 4 HOURS AS NEEDED FOR WHEEZING OR SHORTNESS OF BREATH 9 g 0  . cyclobenzaprine (FLEXERIL) 10 MG tablet TAKE 1 TABLET (10 MG TOTAL) BY MOUTH THREE TIMES DAILY AS NEEDED FOR  MUSCLE  SPASMS 60 tablet 0  . FLUoxetine (PROZAC) 20 MG capsule Take 1 capsule (20 mg total) by mouth daily. 30 capsule 0  . loratadine (CLARITIN) 10 MG tablet Take 1 tablet (10 mg total) by mouth daily. 30 tablet 2  . Lorcaserin HCl (BELVIQ) 10 MG TABS Take 10 mg by mouth 2 (two) times daily. 60 tablet 0  . MELATONIN PO Take 10 mg by mouth at bedtime.      . meloxicam (MOBIC) 7.5 MG tablet Take 1 tablet (7.5 mg total) by mouth daily. 30 tablet 0  . metoprolol tartrate (LOPRESSOR) 50 MG tablet Take 1 tablet (50 mg total) by mouth 2 (two) times daily. 60 tablet 0  . mometasone (NASONEX) 50 MCG/ACT nasal spray Place 2 sprays into the nose daily. 17 g 0  . Multiple Vitamin (MULTIVITAMIN) tablet Take 1 tablet by mouth daily.    . polyethylene glycol powder (GLYCOLAX/MIRALAX) powder Take 17 g by mouth daily. 3350 g 1  . SUMAtriptan (IMITREX) 50 MG tablet Take 1 tab (50mg ) orally at the onset of her migraine, may repeat in 2 hours if migraine persists.  Total daily maximum dose is 200 mg 10 tablet 0  . topiramate (TOPAMAX) 100 MG tablet Take 1.5 tablets (150 mg total) by mouth 2 (two) times daily. 90 tablet 0  . traMADol (ULTRAM) 50 MG tablet TAKE 1 TABLET BY MOUTH EVERY 12 HOURS AS NEEDED 40 tablet 1  . traZODone (DESYREL) 50 MG tablet Take 1 tablet (50 mg total) by mouth at bedtime as needed for sleep. 30 tablet 0  . Vitamin D, Ergocalciferol, (DRISDOL) 50000 units CAPS capsule Take 1 capsule (50,000 Units total)  by mouth every 7 (seven) days. 4 capsule 2   No current facility-administered medications for this visit.     Allergies:   Patient has no known allergies.    Social History:  The patient  reports that she has never smoked. She has never used smokeless tobacco. She reports that she does not drink alcohol or use drugs.   Family History:  The patient's family history includes Alcohol abuse in her father; Arthritis in her father and mother; Asthma in her father; COPD in her father; Early death in her maternal grandmother; Hearing loss in her maternal grandfather; Heart disease in her paternal grandmother; Heart failure in her father and paternal grandfather; Hypertension in her father, maternal grandfather, maternal grandmother, mother, paternal grandfather, and paternal grandmother; Pneumonia in her maternal grandmother; Stroke in her maternal  grandfather.    ROS:  Please see the history of present illness.   Otherwise, review of systems are positive for none.   All other systems are reviewed and negative.    PHYSICAL EXAM: VS:  BP 134/90   Pulse 78   Ht 5\' 6"  (1.676 m)   Wt (!) 326 lb (147.9 kg)   BMI 52.62 kg/m  , BMI Body mass index is 52.62 kg/m. GEN: Well nourished, obese BF, in no acute distress  HEENT: normal  Neck: no JVD, carotid bruits, or masses Cardiac: RRR; no murmurs, rubs, or gallops,no edema  Respiratory:  clear to auscultation bilaterally, normal work of breathing GI: soft, nontender, nondistended, + BS MS: no deformity or atrophy  Skin: warm and dry, no rash Neuro:  Strength and sensation are intact Psych: euthymic mood, full affect   EKG:  EKG is ordered today. The ekg ordered today demonstrates NSR with rate 78. Normal. I have personally reviewed and interpreted this study.    Recent Labs: 12/08/2018: BUN 13; Creatinine, Ser 0.88; Hemoglobin 13.6; Platelets 321; Potassium 4.4; Sodium 137; TSH 2.110    Lipid Panel    Component Value Date/Time   CHOL 123 12/08/2018 1624   TRIG 75 12/08/2018 1624   HDL 39 (L) 12/08/2018 1624   CHOLHDL 3.2 12/08/2018 1624   CHOLHDL 2.2 11/30/2016 0926   VLDL 8 02/22/2016 0933   LDLCALC 69 12/08/2018 1624      Wt Readings from Last 3 Encounters:  12/18/18 (!) 326 lb (147.9 kg)  12/08/18 (!) 321 lb (145.6 kg)  12/10/17 (!) 321 lb 6.4 oz (145.8 kg)      Other studies Reviewed: Additional studies/ records that were reviewed today include: none Review of the above records demonstrates: N/A   ASSESSMENT AND PLAN:  1.  Dyspnea on exertion. She is concerned given family history of CHF but her exam and Ecg are unremarkable. She could have hypertensive heart disease. Will check an Echo. If this is normal I would recommend a sleep study to evaluate for OSA.  2. HTN controlled.    Current medicines are reviewed at length with the patient today.  The  patient does not have concerns regarding medicines.  The following changes have been made:  no change  Labs/ tests ordered today include:   Orders Placed This Encounter  Procedures  . EKG 12-Lead  . ECHOCARDIOGRAM COMPLETE     Disposition:   FU with TBD   Signed, Onna Nodal [place], MD  12/18/2018 3:49 PM    [facility] [address], [place], [place], [address] Phone [phone], Fax [phone]

## 2018-12-18 NOTE — Patient Instructions (Signed)
Medication Instructions:  No changes   Lab work: None ordered   Testing/Procedures: Schedule Echo  Follow-Up: At [facility], you and your health needs are our priority.  As part of our continuing mission to provide you with exceptional heart care, we have created designated Provider Care Teams.  These Care Teams include your primary Cardiologist (physician) and Advanced Practice Providers (APPs -  Physician Assistants and Nurse Practitioners) who all work together to provide you with the care you need, when you need it. . Follow up will depend on echo results

## 2018-12-24 ENCOUNTER — Ambulatory Visit: Payer: 59

## 2018-12-25 ENCOUNTER — Ambulatory Visit: Payer: 59 | Admitting: Physical Therapy

## 2018-12-25 ENCOUNTER — Other Ambulatory Visit (HOSPITAL_COMMUNITY): Payer: 59

## 2018-12-29 DIAGNOSIS — J310 Chronic rhinitis: Secondary | ICD-10-CM | POA: Insufficient documentation

## 2018-12-29 DIAGNOSIS — H6123 Impacted cerumen, bilateral: Secondary | ICD-10-CM | POA: Insufficient documentation

## 2018-12-29 DIAGNOSIS — H900 Conductive hearing loss, bilateral: Secondary | ICD-10-CM | POA: Insufficient documentation

## 2018-12-31 ENCOUNTER — Other Ambulatory Visit: Payer: Self-pay | Admitting: Family Medicine

## 2019-01-01 ENCOUNTER — Other Ambulatory Visit (HOSPITAL_COMMUNITY): Payer: 59

## 2019-01-01 DIAGNOSIS — R0989 Other specified symptoms and signs involving the circulatory and respiratory systems: Secondary | ICD-10-CM

## 2019-01-05 ENCOUNTER — Ambulatory Visit: Payer: 59 | Attending: Family Medicine | Admitting: Neurology

## 2019-01-05 DIAGNOSIS — G479 Sleep disorder, unspecified: Secondary | ICD-10-CM | POA: Diagnosis present

## 2019-01-05 DIAGNOSIS — R5383 Other fatigue: Secondary | ICD-10-CM | POA: Diagnosis not present

## 2019-01-06 ENCOUNTER — Ambulatory Visit: Payer: 59 | Admitting: Family Medicine

## 2019-01-06 ENCOUNTER — Ambulatory Visit: Payer: 59

## 2019-01-09 ENCOUNTER — Other Ambulatory Visit (HOSPITAL_COMMUNITY): Payer: 59

## 2019-01-12 NOTE — Procedures (Signed)
[facility] Lorene Klimas A. Gerilyn Pilgrim, MD     [email or web address]             NOCTURNAL POLYSOMNOGRAPHY   LOCATION: [facility]   Patient Name: Cynthia [name], Cynthia [name] Date: 01/05/2019 Gender: Female D.O.B: [birth year] Age (years): 38 Referring Provider: Cammie Fulp Height (inches): 65 Interpreting Physician: Beryle Beams MD, ABSM Weight (lbs): 312 RPSGT: Peak, Robert BMI: 52 MRN: [id number] Neck Size: 16.00 CLINICAL INFORMATION Sleep Study Type: NPSG     Indication for sleep study: OSA     Epworth Sleepiness Score: 15      Most recent polysomnogram dated 05/02/2016 revealed an AHI of 0.0/h and RDI of 0.8/h. SLEEP STUDY TECHNIQUE As per the AASM Manual for the Scoring of Sleep and Associated Events v2.3 (April 2016) with a hypopnea requiring 4% desaturations.  The channels recorded and monitored were frontal, central and occipital EEG, electrooculogram (EOG), submentalis EMG (chin), nasal and oral airflow, thoracic and abdominal wall motion, anterior tibialis EMG, snore microphone, electrocardiogram, and pulse oximetry.  MEDICATIONS Medications self-administered by patient taken the night of the study : FLEXERIL, TOPAMAX, TRAMADOL  Current Outpatient Medications:  .  albuterol (PROVENTIL HFA;VENTOLIN HFA) 108 (90 Base) MCG/ACT inhaler, INHALE 2 PUFFS INTO THE LUNGS EVERY 4 HOURS AS NEEDED FOR WHEEZING OR SHORTNESS OF BREATH, Disp: 9 g, Rfl: 0 .  cyclobenzaprine (FLEXERIL) 10 MG tablet, TAKE 1 TABLET (10 MG TOTAL) BY MOUTH THREE TIMES DAILY AS NEEDED FOR  MUSCLE  SPASMS, Disp: 60 tablet, Rfl: 0 .  FLUoxetine (PROZAC) 20 MG capsule, Take 1 capsule (20 mg total) by mouth daily., Disp: 30 capsule, Rfl: 0 .  loratadine (CLARITIN) 10 MG tablet, Take 1 tablet (10 mg total) by mouth daily., Disp: 30 tablet, Rfl: 2 .  Lorcaserin HCl (BELVIQ) 10 MG TABS, Take 10 mg by mouth 2 (two) times daily., Disp: 60 tablet, Rfl: 0 .  MELATONIN PO, Take 10 mg by mouth at bedtime., Disp:  , Rfl:  .  meloxicam (MOBIC) 7.5 MG tablet, Take 1 tablet (7.5 mg total) by mouth daily., Disp: 30 tablet, Rfl: 0 .  metoprolol tartrate (LOPRESSOR) 50 MG tablet, Take 1 tablet (50 mg total) by mouth 2 (two) times daily., Disp: 60 tablet, Rfl: 0 .  mometasone (NASONEX) 50 MCG/ACT nasal spray, Place 2 sprays into the nose daily., Disp: 17 g, Rfl: 0 .  Multiple Vitamin (MULTIVITAMIN) tablet, Take 1 tablet by mouth daily., Disp: , Rfl:  .  polyethylene glycol powder (GLYCOLAX/MIRALAX) powder, Take 17 g by mouth daily., Disp: 3350 g, Rfl: 1 .  SUMAtriptan (IMITREX) 50 MG tablet, Take 1 tab (50mg ) orally at the onset of her migraine, may repeat in 2 hours if migraine persists.  Total daily maximum dose is 200 mg, Disp: 10 tablet, Rfl: 0 .  topiramate (TOPAMAX) 100 MG tablet, Take 1.5 tablets (150 mg total) by mouth 2 (two) times daily., Disp: 90 tablet, Rfl: 0 .  traMADol (ULTRAM) 50 MG tablet, TAKE 1 TABLET BY MOUTH EVERY 12 HOURS AS NEEDED, Disp: 40 tablet, Rfl: 1 .  traZODone (DESYREL) 50 MG tablet, Take 1 tablet (50 mg total) by mouth at bedtime as needed for sleep., Disp: 30 tablet, Rfl: 0 .  Vitamin D, Ergocalciferol, (DRISDOL) 50000 units CAPS capsule, Take 1 capsule (50,000 Units total) by mouth every 7 (seven) days., Disp: 4 capsule, Rfl: 2     SLEEP ARCHITECTURE The study was initiated at 9:33:35 PM and ended at 5:03:01 AM.  Sleep onset time was 21.4 minutes  and the sleep efficiency was 84.6%%. The total sleep time was 380 minutes.  Stage REM latency was 103.0 minutes.  The patient spent 4.5%% of the night in stage N1 sleep, 77.8%% in stage N2 sleep, 2.8%% in stage N3 and 15% in REM.  Alpha intrusion was absent.  Supine sleep was 32.50%.  RESPIRATORY PARAMETERS The overall apnea/hypopnea index (AHI) was 2.2 per hour. There were 1 total apneas, including 0 obstructive, 1 central and 0 mixed apneas. There were 13 hypopneas and 6 RERAs.  The AHI during Stage REM sleep was 14.7 per  hour.  AHI while supine was 3.4 per hour.  The mean oxygen saturation was 96.9%. The minimum SpO2 during sleep was 91.0%.  moderate snoring was noted during this study.  CARDIAC DATA The 2 lead EKG demonstrated sinus rhythm. The mean heart rate was 96.1 beats per minute. Other EKG findings include: None.  LEG MOVEMENT DATA The total PLMS were 0 with a resulting PLMS index of 0.0. Associated arousal with leg movement index was 0.0.  IMPRESSIONS No significant obstructive sleep apnea occurred during this study. No significant central sleep apnea occurred during this study. Clinically significant periodic limb movements did not occur during sleep. No significant associated arousals.  Argie Ramming, MD Diplomate, American Board of Sleep Medicine.  ELECTRONICALLY SIGNED ON:  01/12/2019, 8:46 AM [facility] PH: [phone]   FX: [phone] ACCREDITED BY THE AMERICAN ACADEMY OF SLEEP MEDICINE

## 2019-01-14 ENCOUNTER — Telehealth: Payer: Self-pay

## 2019-01-14 NOTE — Telephone Encounter (Signed)
Attempted to contact the patient to inform her that her sleep study is normal. Call placed to # [phone] (M) and a message was left requesting a call back to this CM # [phone]

## 2019-01-16 ENCOUNTER — Ambulatory Visit (HOSPITAL_COMMUNITY): Payer: 59 | Attending: Cardiovascular Disease

## 2019-01-16 DIAGNOSIS — R0609 Other forms of dyspnea: Secondary | ICD-10-CM | POA: Diagnosis not present

## 2019-01-16 DIAGNOSIS — Z6841 Body Mass Index (BMI) 40.0 and over, adult: Secondary | ICD-10-CM

## 2019-01-16 DIAGNOSIS — I10 Essential (primary) hypertension: Secondary | ICD-10-CM

## 2019-01-16 DIAGNOSIS — R0683 Snoring: Secondary | ICD-10-CM | POA: Diagnosis not present

## 2019-01-19 ENCOUNTER — Encounter: Payer: Self-pay | Admitting: Family Medicine

## 2019-01-19 ENCOUNTER — Telehealth: Payer: Self-pay

## 2019-01-19 ENCOUNTER — Ambulatory Visit: Payer: 59 | Attending: Family Medicine | Admitting: Family Medicine

## 2019-01-19 VITALS — BP 134/83 | HR 104 | Temp 98.9°F | Resp 18 | Ht 66.0 in | Wt 322.0 lb

## 2019-01-19 DIAGNOSIS — G4709 Other insomnia: Secondary | ICD-10-CM

## 2019-01-19 DIAGNOSIS — F329 Major depressive disorder, single episode, unspecified: Secondary | ICD-10-CM

## 2019-01-19 DIAGNOSIS — K5904 Chronic idiopathic constipation: Secondary | ICD-10-CM

## 2019-01-19 DIAGNOSIS — G4700 Insomnia, unspecified: Secondary | ICD-10-CM

## 2019-01-19 DIAGNOSIS — R5383 Other fatigue: Secondary | ICD-10-CM

## 2019-01-19 DIAGNOSIS — M62830 Muscle spasm of back: Secondary | ICD-10-CM

## 2019-01-19 DIAGNOSIS — M797 Fibromyalgia: Secondary | ICD-10-CM

## 2019-01-19 DIAGNOSIS — F419 Anxiety disorder, unspecified: Secondary | ICD-10-CM

## 2019-01-19 DIAGNOSIS — I10 Essential (primary) hypertension: Secondary | ICD-10-CM | POA: Diagnosis not present

## 2019-01-19 DIAGNOSIS — M5441 Lumbago with sciatica, right side: Secondary | ICD-10-CM

## 2019-01-19 DIAGNOSIS — M2241 Chondromalacia patellae, right knee: Secondary | ICD-10-CM

## 2019-01-19 DIAGNOSIS — R000 Tachycardia, unspecified: Secondary | ICD-10-CM

## 2019-01-19 DIAGNOSIS — M25561 Pain in right knee: Secondary | ICD-10-CM | POA: Diagnosis not present

## 2019-01-19 DIAGNOSIS — R0609 Other forms of dyspnea: Secondary | ICD-10-CM

## 2019-01-19 DIAGNOSIS — G43809 Other migraine, not intractable, without status migrainosus: Secondary | ICD-10-CM

## 2019-01-19 DIAGNOSIS — M5442 Lumbago with sciatica, left side: Secondary | ICD-10-CM

## 2019-01-19 DIAGNOSIS — F32A Depression, unspecified: Secondary | ICD-10-CM

## 2019-01-19 DIAGNOSIS — G8929 Other chronic pain: Secondary | ICD-10-CM

## 2019-01-19 DIAGNOSIS — G479 Sleep disorder, unspecified: Secondary | ICD-10-CM

## 2019-01-19 DIAGNOSIS — J309 Allergic rhinitis, unspecified: Secondary | ICD-10-CM

## 2019-01-19 DIAGNOSIS — J302 Other seasonal allergic rhinitis: Secondary | ICD-10-CM

## 2019-01-19 DIAGNOSIS — K5909 Other constipation: Secondary | ICD-10-CM

## 2019-01-19 MED ORDER — TRAMADOL HCL 50 MG PO TABS: 50.0000 mg | ORAL_TABLET | Freq: Two times a day (BID) | ORAL | 0 refills | Status: DC | PRN

## 2019-01-19 MED ORDER — METOPROLOL TARTRATE 50 MG PO TABS: 25.0000 mg | ORAL_TABLET | Freq: Two times a day (BID) | ORAL | 6 refills | Status: DC

## 2019-01-19 MED ORDER — TOPIRAMATE 100 MG PO TABS: 150.0000 mg | ORAL_TABLET | Freq: Two times a day (BID) | ORAL | 6 refills | Status: DC

## 2019-01-19 MED ORDER — CYCLOBENZAPRINE HCL 10 MG PO TABS: ORAL_TABLET | ORAL | 0 refills | Status: DC

## 2019-01-19 MED ORDER — POLYETHYLENE GLYCOL 3350 17 GM/SCOOP PO POWD: 17.0000 g | Freq: Every day | ORAL | 4 refills | Status: DC

## 2019-01-19 MED ORDER — FLUOXETINE HCL 20 MG PO CAPS: 20.0000 mg | ORAL_CAPSULE | Freq: Every day | ORAL | 4 refills | Status: DC

## 2019-01-19 MED ORDER — MOMETASONE FUROATE 50 MCG/ACT NA SUSP: 2.0000 | Freq: Every day | NASAL | 4 refills | Status: DC

## 2019-01-19 MED ORDER — TRAZODONE HCL 100 MG PO TABS: 100.0000 mg | ORAL_TABLET | Freq: Every evening | ORAL | 3 refills | Status: DC | PRN

## 2019-01-19 MED ORDER — SUMATRIPTAN SUCCINATE 50 MG PO TABS: ORAL_TABLET | ORAL | 4 refills | Status: DC

## 2019-01-19 MED ORDER — PREDNISONE 20 MG PO TABS: ORAL_TABLET | ORAL | 0 refills | Status: DC

## 2019-01-19 NOTE — Progress Notes (Signed)
Patient complains of lower back pain with right knee pain. Patient complains of not sleeping for the past 4 days and now having a HA over the left eye and middle of the head.   Patient states she has to take 3 trazodone in order to feel the benefit or take 2 and another OTC aide.   Patient states she is utilizing weight watchers, nutrition and physical activity 3 times a week and is still having trouble with her weight.  Patient stopped taking metoprolol a month ago because she states her BP " was too low and I feel light headed". Patient states BP is 100/75 when she feels "light headed"

## 2019-01-19 NOTE — Telephone Encounter (Signed)
Call placed to the patient and informed her that her sleep study was normal.  She said that she still has difficulty sleeping and has a headache x 3 days.   She is seeing Dr Jillyn Hidden this afternoon.

## 2019-01-19 NOTE — Progress Notes (Signed)
Established Patient Office Visit  Subjective:  Patient ID: Cynthia Jumboamieka Age, female    DOB: Jan 24, 1981  Age: 38 y.o. MRN: [id number]  CC: multiple complaints Chief Complaint  Patient presents with  . Follow-up    HPI Cynthia [name] presents for follow-up of and concerns about multiple medical issues.  She reports continued issues with pain in her right knee which she describes as a deep aching sensation.  Her pain ranges from a 5 at the lowest to a 12 on a 0-to-10 scale with 10 being worst possible pain especially with bending her knee, walking or going up or down stairs.  Patient also with complaint of recent onset of increased low back pain with muscle spasms.  Patient reports that the pain feels as if it radiates into her buttocks and on either side of the upper legs.  Patient requests refill of Flexeril as well as tramadol.  Patient states that her back pain is currently a 10 on a 0-to-10 scale and is worse with bending and other movements.      Patient also saw orthopedics and was diagnosed with chondromalacia patella of the right knee.  Patient states that the injection she received for her knee pain did help for a little bit.  Patient also saw ENT since her last visit.  Patient states that her ear nose and throat doctor was also convinced that patient has sleep apnea.  Patient reports that she did have a sleep study in the past which was inconclusive and when she recently had repeat sleep study she was told that she does not have sleep apnea.  Patient also saw cardiology recently and the cardiologist also thought that she might have sleep apnea.  Patient did have echocardiogram and patient states that she was actually called earlier today and was told that the echocardiogram was normal.  Patient reports that she stopped taking her metoprolol because she felt that this caused her to have low blood pressure with dizziness.  She believes that her heart rate has always been high. She thinks that her  BP is controlled.        She has been taking her topamax twice daily to help prevent her migraines but she has had a left sided headache that is a pressure sensation behind the left eye and left side of her head where she has her typical migraines.  She states that her current headache has been going on for the past 4 days.  Usually when she takes Imitrex they will cause her headache to go completely away but with her recent headache her headache will lessen but not go away completely.  Patient denies any focal numbness or weakness, no slurred speech.  No injury to the head.  She wonders if her allergies may be contributing to her headache.  Patient does need a refill of her Nasonex.  Patient will also need refill of Topamax and Imitrex.  Patient also thinks that her lack of sleep may be contributing to her headaches.  Patient states that recently she has had to take 3 of her 50 mg trazodone in order to fall asleep and stay asleep.  Patient has also been taking Z quill in addition to the trazodone.      She continues to have issues with chronic constipation and needs refill of MiraLAX.  She denies any blood in the stool and no dark stools.  She denies any current abdominal pain.  No current nausea.  Patient reports history of asthma but  does not need a refill of her albuterol inhaler.  She denies any recent cough, chest tightness or wheezing. She continues to have SOB with activity such as walking. Patient reports difficulty losing weight currently but did lose 50 pounds last year by using weight watchers.  Patient is trying to stick with dietary changes but has been unable to exercise secondary to her pain which she states is generalized due to fibromyalgia as well as current acute right knee pain and back pain.   Past Medical History:  Diagnosis Date  . Arthritis   . Asthma    childhood  . Asthma   . Fibromyalgia 2007  . Hypertension    on and off since 2007    Family History  Problem Relation Age  of Onset  . Arthritis Mother   . Hypertension Mother   . Alcohol abuse Father   . Arthritis Father   . Asthma Father   . COPD Father   . Hypertension Father   . Heart failure Father   . Early death Maternal Grandmother   . Hypertension Maternal Grandmother   . Pneumonia Maternal Grandmother   . Hearing loss Maternal Grandfather   . Hypertension Maternal Grandfather   . Stroke Maternal Grandfather   . Hypertension Paternal Grandmother   . Heart disease Paternal Grandmother   . Hypertension Paternal Grandfather   . Heart failure Paternal Grandfather     Social History   Tobacco Use  . Smoking status: Never Smoker  . Smokeless tobacco: Never Used  Substance Use Topics  . Alcohol use: No  . Drug use: No    Outpatient Medications Prior to Visit  Medication Sig Dispense Refill  . albuterol (PROVENTIL HFA;VENTOLIN HFA) 108 (90 Base) MCG/ACT inhaler INHALE 2 PUFFS INTO THE LUNGS EVERY 4 HOURS AS NEEDED FOR WHEEZING OR SHORTNESS OF BREATH 9 g 0  . cyclobenzaprine (FLEXERIL) 10 MG tablet TAKE 1 TABLET (10 MG TOTAL) BY MOUTH THREE TIMES DAILY AS NEEDED FOR  MUSCLE  SPASMS 60 tablet 0  . FLUoxetine (PROZAC) 20 MG capsule Take 1 capsule (20 mg total) by mouth daily. 30 capsule 0  . loratadine (CLARITIN) 10 MG tablet Take 1 tablet (10 mg total) by mouth daily. 30 tablet 2  . Lorcaserin HCl (BELVIQ) 10 MG TABS Take 10 mg by mouth 2 (two) times daily. 60 tablet 0  . MELATONIN PO Take 10 mg by mouth at bedtime.    . meloxicam (MOBIC) 7.5 MG tablet Take 1 tablet (7.5 mg total) by mouth daily. 30 tablet 0  . metoprolol tartrate (LOPRESSOR) 50 MG tablet Take 1 tablet (50 mg total) by mouth 2 (two) times daily. 60 tablet 0  . mometasone (NASONEX) 50 MCG/ACT nasal spray Place 2 sprays into the nose daily. 17 g 0  . Multiple Vitamin (MULTIVITAMIN) tablet Take 1 tablet by mouth daily.    . polyethylene glycol powder (GLYCOLAX/MIRALAX) powder Take 17 g by mouth daily. 3350 g 1  . SUMAtriptan  (IMITREX) 50 MG tablet Take 1 tab (50mg ) orally at the onset of her migraine, may repeat in 2 hours if migraine persists.  Total daily maximum dose is 200 mg 10 tablet 0  . topiramate (TOPAMAX) 100 MG tablet Take 1.5 tablets (150 mg total) by mouth 2 (two) times daily. 90 tablet 0  . traMADol (ULTRAM) 50 MG tablet TAKE 1 TABLET BY MOUTH EVERY 12 HOURS AS NEEDED 40 tablet 1  . traZODone (DESYREL) 50 MG tablet Take 1 tablet (50 mg  total) by mouth at bedtime as needed for sleep. 30 tablet 0  . Vitamin D, Ergocalciferol, (DRISDOL) 50000 units CAPS capsule Take 1 capsule (50,000 Units total) by mouth every 7 (seven) days. 4 capsule 2   No facility-administered medications prior to visit.     No Known Allergies  ROS Review of Systems  Constitutional: Positive for fatigue. Negative for chills and fever.  HENT: Positive for congestion, postnasal drip, rhinorrhea and sinus pressure. Negative for hearing loss, nosebleeds, sinus pain, sneezing, sore throat and trouble swallowing.   Eyes: Negative for photophobia and visual disturbance.  Respiratory: Positive for shortness of breath. Negative for cough and wheezing.   Cardiovascular: Negative for chest pain, palpitations and leg swelling.  Gastrointestinal: Positive for constipation. Negative for abdominal pain, diarrhea and nausea.  Endocrine: Negative for polydipsia, polyphagia and polyuria.  Genitourinary: Negative for dysuria, frequency and urgency.  Musculoskeletal: Positive for arthralgias, back pain and gait problem.  Neurological: Positive for headaches. Negative for dizziness and numbness.  Hematological: Negative for adenopathy. Does not bruise/bleed easily.  Psychiatric/Behavioral: Positive for sleep disturbance. Negative for self-injury and suicidal ideas.      Objective:    Physical Exam BP 134/83 (BP Location: Left Arm, Patient Position: Sitting, Cuff Size: Large)   Pulse (!) 104   Temp 98.9 F (37.2 C) (Oral)   Resp 18   Ht 5'  6" (1.676 m)   Wt (!) 322 lb (146.1 kg)   LMP 01/05/2019   BMI 51.97 kg/m nurses notes and vital signs reviewed   General- WNWD morbidly obese female in NAD while sitting in exam chair but she has difficulty getting up from the chair due to complaint of muscle spasms in her back and c/o knee pain and has difficulty getting onto the exam table ENT- TM's dull, moderate edema of the nasal turbinates with clear nasal discharge, edema/erythema of the tonsillar arch and a narrowed posterior airway due to body habitus and large tongue base Neck- large neck size, borderline thyromegaly versus fatty tissue in the anterior neck CV- regular rhythm and  mild tachycardia Lungs- clear to auscultation but with decreased breath sounds at the lung bases which may be related to body habitus ABD- truncal obesity, soft and nontender Back- no CVA tenderness; lordosis of the lower back and lumbosacral tenderness to palpation as well as thoracolumbar paraspinous spasm Musculoskeletal- patient with right knee tenderness over the lower part of the patella as well as right lateral joint line tenderness EXT- no edema Psych- normal mood  Wt Readings from Last 3 Encounters:  01/19/19 (!) 322 lb (146.1 kg)  12/18/18 (!) 326 lb (147.9 kg)  12/08/18 (!) 321 lb (145.6 kg)     Health Maintenance Due  Topic Date Due  . HIV Screening  12/18/1993  . PAP SMEAR-Modifier  02/22/2017    There are no preventive care reminders to display for this patient.  Lab Results  Component Value Date   TSH 2.110 12/08/2018   Lab Results  Component Value Date   WBC 4.4 12/08/2018   HGB 13.6 12/08/2018   HCT 41.7 12/08/2018   MCV 88 12/08/2018   PLT 321 12/08/2018   Lab Results  Component Value Date   NA 137 12/08/2018   K 4.4 12/08/2018   CO2 21 12/08/2018   GLUCOSE 83 12/08/2018   BUN 13 12/08/2018   CREATININE 0.88 12/08/2018   BILITOT 0.4 07/13/2017   ALKPHOS 66 07/13/2017   AST 22 07/13/2017   ALT 23 07/13/2017  PROT 7.9 07/13/2017   ALBUMIN 3.8 07/13/2017   CALCIUM 8.8 12/08/2018   ANIONGAP 7 07/13/2017   Lab Results  Component Value Date   CHOL 123 12/08/2018   Lab Results  Component Value Date   HDL 39 (L) 12/08/2018   Lab Results  Component Value Date   LDLCALC 69 12/08/2018   Lab Results  Component Value Date   TRIG 75 12/08/2018   Lab Results  Component Value Date   CHOLHDL 3.2 12/08/2018   Lab Results  Component Value Date   HGBA1C 5.5 01/13/2014      Assessment & Plan:  1. Essential hypertension BP is slightly above goal of 130/80 and patient with tachycardia and she has been asked to restart metoprolol but decrease the dose to 25 mg twice per day. DASH diet recommended. - metoprolol tartrate (LOPRESSOR) 50 MG tablet; Take 0.5 tablets (25 mg total) by mouth 2 (two) times daily.  Dispense: 30 tablet; Refill: 6  2. Tachycardia She was asked to restart metoprolol 25 mg twice per day. - metoprolol tartrate (LOPRESSOR) 50 MG tablet; Take 0.5 tablets (25 mg total) by mouth 2 (two) times daily.  Dispense: 30 tablet; Refill: 6  3. Chronic pain of right knee Patient is encouraged to schedule a follow-up appointment with Orthopedics and will also be referred to Pain Management (Physical Medicine and Rehab practice if possible) for ongoing treatment of her right knee pain, back pain and she reports that she also has been diagnosed in the past with fibromyalgia. - Ambulatory referral to Pain Clinic  4. Chondromalacia patellae, right knee Schedule Orthopedic follow-up. Referral to pain management. Continue efforts at weight loss - Ambulatory referral to Pain Clinic  5. Other migraine without status migrainosus, not intractable Refill of topamax for migraine prevention and imitrex for treatment of migraines. Prednisone taper for back pain may also help with current migraine type headache. Call/return  if no improvement in current headache - topiramate (TOPAMAX) 100 MG tablet;  Take 1.5 tablets (150 mg total) by mouth 2 (two) times daily. For migraine prevention  Dispense: 90 tablet; Refill: 6 - SUMAtriptan (IMITREX) 50 MG tablet; Take 1 tab ( ) orally at the onset of her migraine, may repeat in 2 hours if migraine persists.  Total daily maximum dose is 200 mg  Dispense: 10 tablet; Refill: 4  6. Acute bilateral low back pain with bilateral sciatica RX for tramadol for acute onset of low back pain with radiation and refill of flexeril, pain management referral, continue weight loss efforts. Prednisone taper - traMADol (ULTRAM) 50 MG tablet; Take 1 tablet (50 mg total) by mouth every 12 (twelve) hours as needed.  Dispense: 30 tablet; Refill: 0 - Ambulatory referral to Pain Clinic  7. Muscle spasm of back Refill of flexeril for muscle spasm but patient was made aware that she needs to use caution with taking flexeril and other medications that cause sedation though patient denies any drowsiness with flexeril use. Prednisone taper also provided. - predniSONE (DELTASONE) 20 MG tablet; Take 2 pills x 2 days then 1 pill daily x 2 days then 1/2 pill daily x 4 days, take after eating  Dispense: 8 tablet; Refill: 0  8. Allergic rhinitis, unspecified seasonality, unspecified trigger Refill provided of nasonex - mometasone (NASONEX) 50 MCG/ACT nasal spray; Place 2 sprays into the nose daily.  Dispense: 17 g; Refill: 4  9. Chronic idiopathic constipation Refill provided for Miralax and she has had recent normal TSH. Increase water and fiber intake and  consider GI follow-up  10. Insomnia, unspecified type Patient reports chronic issues with insomnia and will be referred to a sleep specialist as she had recent sleep study that was normal per results but patient with exam findings and history that are strongly suggestive of sleep apnea.  Rx for trazodone 100 mg at bedtime but use caution to prevent oversedation - traZODone (DESYREL) 100 MG tablet; Take 1 tablet (100 mg total) by  mouth at bedtime as needed for sleep.  Dispense: 30 tablet; Refill: 3  12. Fibromyalgia Patient reports prior diagnosis of fibromyalgia and will be referred to pain management for further evaluation and treatment - Ambulatory referral to Pain Clinic  14. Anxiety and depression Refill of prozac and consider counseling - FLUoxetine (PROZAC) 20 MG capsule; Take 1 capsule (20 mg total) by mouth daily.  Dispense: 30 capsule; Refill: 4  15. Dyspnea on exertion Pulmonology referral and continue efforts at weight loss - Ambulatory referral to Pulmonology  16. Sleep disturbance  Referral placed for patient to see a sleep specialist at pulmonology due to dyspnea on exertion and possibility of sleep apnea - Ambulatory referral to Pulmonology  18. Fatigue, unspecified type Referral for patient to see a sleep specialist due to insomnia and possible sleep apnea as well as pain medicine referral for patient's chronic pain - Ambulatory referral to Pulmonology  19. Morbid obesity with BMI of 51 Continue dietary changes and follow-up with pain management to help with chronic pain so that patient can increase her level of activity  An After Visit Summary was printed and given to the patient. Allergies as of 01/19/2019   No Known Allergies     Medication List       Accurate as of January 19, 2019  7:04 PM. Always use your most recent med list.        albuterol 108 (90 Base) MCG/ACT inhaler Commonly known as:  PROVENTIL HFA;VENTOLIN HFA INHALE 2 PUFFS INTO THE LUNGS EVERY 4 HOURS AS NEEDED FOR WHEEZING OR SHORTNESS OF BREATH   cyclobenzaprine 10 MG tablet Commonly known as:  FLEXERIL TAKE 1 TABLET (10 MG TOTAL) BY MOUTH THREE TIMES DAILY AS NEEDED FOR  MUSCLE  SPASMS   FLUoxetine 20 MG capsule Commonly known as:  PROZAC Take 1 capsule (20 mg total) by mouth daily.   loratadine 10 MG tablet Commonly known as:  CLARITIN Take 1 tablet (10 mg total) by mouth daily.   Lorcaserin HCl 10 MG  Tabs Commonly known as:  BELVIQ Take 10 mg by mouth 2 (two) times daily.   MELATONIN PO Take 10 mg by mouth at bedtime.   meloxicam 7.5 MG tablet Commonly known as:  MOBIC Take 1 tablet (7.5 mg total) by mouth daily.   metoprolol tartrate 50 MG tablet Commonly known as:  LOPRESSOR Take 0.5 tablets (25 mg total) by mouth 2 (two) times daily.   mometasone 50 MCG/ACT nasal spray Commonly known as:  NASONEX Place 2 sprays into the nose daily.   multivitamin tablet Take 1 tablet by mouth daily.   polyethylene glycol powder powder Commonly known as:  GLYCOLAX/MIRALAX Take 17 g by mouth daily. As needed for constipation   predniSONE 20 MG tablet Commonly known as:  DELTASONE Take 2 pills x 2 days then 1 pill daily x 2 days then 1/2 pill daily x 4 days, take after eating   SUMAtriptan 50 MG tablet Commonly known as:  IMITREX Take 1 tab (50mg ) orally at the onset of her migraine, may  repeat in 2 hours if migraine persists.  Total daily maximum dose is 200 mg   topiramate 100 MG tablet Commonly known as:  TOPAMAX Take 1.5 tablets (150 mg total) by mouth 2 (two) times daily. For migraine prevention   traMADol 50 MG tablet Commonly known as:  ULTRAM Take 1 tablet (50 mg total) by mouth every 12 (twelve) hours as needed.   traZODone 100 MG tablet Commonly known as:  DESYREL Take 1 tablet (100 mg total) by mouth at bedtime as needed for sleep.   Vitamin D (Ergocalciferol) 1.25 MG (50000 UT) Caps capsule Commonly known as:  DRISDOL Take 1 capsule (50,000 Units total) by mouth every 7 (seven) days.       Follow-up: Return in about 3 months (around 04/21/2019) for chronic issues.   Cain Saupe, MD

## 2019-02-02 ENCOUNTER — Ambulatory Visit: Payer: 59 | Admitting: Cardiology

## 2019-02-09 ENCOUNTER — Other Ambulatory Visit: Payer: Self-pay | Admitting: Pharmacist

## 2019-02-09 DIAGNOSIS — K5909 Other constipation: Secondary | ICD-10-CM

## 2019-02-09 MED ORDER — POLYETHYLENE GLYCOL 3350 17 GM/SCOOP PO POWD: 17.0000 g | Freq: Every day | ORAL | 4 refills | Status: DC

## 2019-02-10 ENCOUNTER — Encounter: Payer: Self-pay | Admitting: Family Medicine

## 2019-02-10 DIAGNOSIS — R000 Tachycardia, unspecified: Secondary | ICD-10-CM

## 2019-02-10 DIAGNOSIS — G4709 Other insomnia: Secondary | ICD-10-CM

## 2019-02-10 DIAGNOSIS — J3089 Other allergic rhinitis: Secondary | ICD-10-CM

## 2019-02-10 DIAGNOSIS — F419 Anxiety disorder, unspecified: Secondary | ICD-10-CM

## 2019-02-10 DIAGNOSIS — I10 Essential (primary) hypertension: Secondary | ICD-10-CM

## 2019-02-10 DIAGNOSIS — F329 Major depressive disorder, single episode, unspecified: Secondary | ICD-10-CM

## 2019-02-10 DIAGNOSIS — G43809 Other migraine, not intractable, without status migrainosus: Secondary | ICD-10-CM

## 2019-02-11 MED ORDER — TRAZODONE HCL 100 MG PO TABS: 100.0000 mg | ORAL_TABLET | Freq: Every evening | ORAL | 0 refills | Status: DC | PRN

## 2019-02-11 MED ORDER — TOPIRAMATE 100 MG PO TABS: 150.0000 mg | ORAL_TABLET | Freq: Two times a day (BID) | ORAL | 0 refills | Status: DC

## 2019-02-11 MED ORDER — METOPROLOL TARTRATE 50 MG PO TABS: 25.0000 mg | ORAL_TABLET | Freq: Two times a day (BID) | ORAL | 0 refills | Status: DC

## 2019-02-11 MED ORDER — FLUOXETINE HCL 20 MG PO CAPS: 20.0000 mg | ORAL_CAPSULE | Freq: Every day | ORAL | 0 refills | Status: DC

## 2019-02-11 MED ORDER — ALBUTEROL SULFATE HFA 108 (90 BASE) MCG/ACT IN AERS: INHALATION_SPRAY | RESPIRATORY_TRACT | 2 refills | Status: DC

## 2019-02-12 ENCOUNTER — Other Ambulatory Visit: Payer: Self-pay | Admitting: Pharmacist

## 2019-02-12 MED ORDER — ALBUTEROL SULFATE HFA 108 (90 BASE) MCG/ACT IN AERS: 2.0000 | INHALATION_SPRAY | RESPIRATORY_TRACT | 2 refills | Status: DC | PRN

## 2019-02-13 MED ORDER — CYCLOBENZAPRINE HCL 10 MG PO TABS: ORAL_TABLET | ORAL | 1 refills | Status: DC

## 2019-02-13 NOTE — Addendum Note (Signed)
Addended by: Lou Cal on: 02/13/2019 10:57 AM   Modules accepted: Orders

## 2019-03-16 ENCOUNTER — Other Ambulatory Visit: Payer: Self-pay | Admitting: Family Medicine

## 2019-03-16 DIAGNOSIS — G43809 Other migraine, not intractable, without status migrainosus: Secondary | ICD-10-CM

## 2019-03-16 DIAGNOSIS — R000 Tachycardia, unspecified: Secondary | ICD-10-CM

## 2019-03-16 DIAGNOSIS — I10 Essential (primary) hypertension: Secondary | ICD-10-CM

## 2019-04-02 ENCOUNTER — Ambulatory Visit: Payer: 59 | Admitting: Internal Medicine

## 2019-04-14 ENCOUNTER — Other Ambulatory Visit: Payer: Self-pay | Admitting: Family Medicine

## 2019-04-14 DIAGNOSIS — F32A Depression, unspecified: Secondary | ICD-10-CM

## 2019-04-14 DIAGNOSIS — F329 Major depressive disorder, single episode, unspecified: Secondary | ICD-10-CM

## 2019-04-14 DIAGNOSIS — G4709 Other insomnia: Secondary | ICD-10-CM

## 2019-04-26 ENCOUNTER — Other Ambulatory Visit: Payer: Self-pay | Admitting: Family Medicine

## 2019-04-26 DIAGNOSIS — G43809 Other migraine, not intractable, without status migrainosus: Secondary | ICD-10-CM

## 2019-04-26 DIAGNOSIS — I10 Essential (primary) hypertension: Secondary | ICD-10-CM

## 2019-04-26 DIAGNOSIS — R000 Tachycardia, unspecified: Secondary | ICD-10-CM

## 2019-04-27 ENCOUNTER — Other Ambulatory Visit: Payer: Self-pay | Admitting: Nurse Practitioner

## 2019-04-27 DIAGNOSIS — Z1231 Encounter for screening mammogram for malignant neoplasm of breast: Secondary | ICD-10-CM

## 2019-04-29 ENCOUNTER — Other Ambulatory Visit: Payer: Self-pay | Admitting: Family Medicine

## 2019-04-29 DIAGNOSIS — F329 Major depressive disorder, single episode, unspecified: Secondary | ICD-10-CM

## 2019-04-29 DIAGNOSIS — F419 Anxiety disorder, unspecified: Secondary | ICD-10-CM

## 2019-04-29 DIAGNOSIS — G4709 Other insomnia: Secondary | ICD-10-CM

## 2019-06-14 ENCOUNTER — Other Ambulatory Visit: Payer: Self-pay | Admitting: Family Medicine

## 2019-06-14 DIAGNOSIS — F329 Major depressive disorder, single episode, unspecified: Secondary | ICD-10-CM

## 2019-06-14 DIAGNOSIS — G4709 Other insomnia: Secondary | ICD-10-CM

## 2019-06-14 DIAGNOSIS — F419 Anxiety disorder, unspecified: Secondary | ICD-10-CM

## 2019-08-10 ENCOUNTER — Other Ambulatory Visit: Payer: Self-pay | Admitting: Family Medicine

## 2019-08-10 DIAGNOSIS — F419 Anxiety disorder, unspecified: Secondary | ICD-10-CM

## 2019-08-10 DIAGNOSIS — F329 Major depressive disorder, single episode, unspecified: Secondary | ICD-10-CM

## 2019-08-26 ENCOUNTER — Other Ambulatory Visit: Payer: Self-pay | Admitting: Family Medicine

## 2019-08-26 DIAGNOSIS — F329 Major depressive disorder, single episode, unspecified: Secondary | ICD-10-CM

## 2019-08-26 DIAGNOSIS — F419 Anxiety disorder, unspecified: Secondary | ICD-10-CM

## 2019-09-01 ENCOUNTER — Other Ambulatory Visit: Payer: Self-pay | Admitting: Family Medicine

## 2019-09-01 DIAGNOSIS — I10 Essential (primary) hypertension: Secondary | ICD-10-CM

## 2019-09-01 DIAGNOSIS — G43809 Other migraine, not intractable, without status migrainosus: Secondary | ICD-10-CM

## 2019-09-01 DIAGNOSIS — R000 Tachycardia, unspecified: Secondary | ICD-10-CM

## 2019-09-15 ENCOUNTER — Other Ambulatory Visit: Payer: Self-pay | Admitting: Family Medicine

## 2019-09-15 DIAGNOSIS — F329 Major depressive disorder, single episode, unspecified: Secondary | ICD-10-CM

## 2019-09-15 DIAGNOSIS — I10 Essential (primary) hypertension: Secondary | ICD-10-CM

## 2019-09-15 DIAGNOSIS — R000 Tachycardia, unspecified: Secondary | ICD-10-CM

## 2019-09-15 DIAGNOSIS — F419 Anxiety disorder, unspecified: Secondary | ICD-10-CM

## 2019-09-15 DIAGNOSIS — G43809 Other migraine, not intractable, without status migrainosus: Secondary | ICD-10-CM

## 2019-09-17 ENCOUNTER — Other Ambulatory Visit: Payer: Self-pay | Admitting: Family Medicine

## 2019-09-17 DIAGNOSIS — G43809 Other migraine, not intractable, without status migrainosus: Secondary | ICD-10-CM

## 2019-09-17 DIAGNOSIS — I10 Essential (primary) hypertension: Secondary | ICD-10-CM

## 2019-09-17 DIAGNOSIS — R000 Tachycardia, unspecified: Secondary | ICD-10-CM

## 2019-10-03 ENCOUNTER — Other Ambulatory Visit: Payer: Self-pay | Admitting: Family Medicine

## 2019-10-03 DIAGNOSIS — F419 Anxiety disorder, unspecified: Secondary | ICD-10-CM

## 2019-10-03 DIAGNOSIS — F329 Major depressive disorder, single episode, unspecified: Secondary | ICD-10-CM

## 2019-10-19 ENCOUNTER — Other Ambulatory Visit: Payer: Self-pay | Admitting: Family Medicine

## 2019-10-19 DIAGNOSIS — F32A Depression, unspecified: Secondary | ICD-10-CM

## 2019-10-19 DIAGNOSIS — F329 Major depressive disorder, single episode, unspecified: Secondary | ICD-10-CM

## 2020-10-02 ENCOUNTER — Encounter (HOSPITAL_COMMUNITY): Payer: Self-pay

## 2020-10-02 ENCOUNTER — Emergency Department (HOSPITAL_COMMUNITY)
Admission: EM | Admit: 2020-10-02 | Discharge: 2020-10-02 | Disposition: A | Discharge status: Home / Self Care | Payer: 59 | Attending: Emergency Medicine | Admitting: Emergency Medicine

## 2020-10-02 ENCOUNTER — Other Ambulatory Visit: Payer: Self-pay

## 2020-10-02 DIAGNOSIS — I10 Essential (primary) hypertension: Secondary | ICD-10-CM | POA: Diagnosis not present

## 2020-10-02 DIAGNOSIS — J45909 Unspecified asthma, uncomplicated: Secondary | ICD-10-CM | POA: Diagnosis not present

## 2020-10-02 DIAGNOSIS — G43009 Migraine without aura, not intractable, without status migrainosus: Secondary | ICD-10-CM | POA: Insufficient documentation

## 2020-10-02 DIAGNOSIS — H6122 Impacted cerumen, left ear: Secondary | ICD-10-CM | POA: Insufficient documentation

## 2020-10-02 DIAGNOSIS — Z79899 Other long term (current) drug therapy: Secondary | ICD-10-CM | POA: Insufficient documentation

## 2020-10-02 DIAGNOSIS — R519 Headache, unspecified: Secondary | ICD-10-CM | POA: Diagnosis present

## 2020-10-02 MED ORDER — KETOROLAC TROMETHAMINE 30 MG/ML IJ SOLN
30.0000 mg | Freq: Once | INTRAMUSCULAR | Status: AC
  Administered 2020-10-02: 30 mg via INTRAVENOUS
  Filled 2020-10-02: qty 1

## 2020-10-02 MED ORDER — SODIUM CHLORIDE 0.9 % IV BOLUS
500.0000 mL | Freq: Once | INTRAVENOUS | Status: AC
  Administered 2020-10-02: 500 mL via INTRAVENOUS

## 2020-10-02 MED ORDER — METOCLOPRAMIDE HCL 5 MG/ML IJ SOLN
10.0000 mg | Freq: Once | INTRAMUSCULAR | Status: AC
  Administered 2020-10-02: 10 mg via INTRAVENOUS
  Filled 2020-10-02: qty 2

## 2020-10-02 MED ORDER — DIPHENHYDRAMINE HCL 50 MG/ML IJ SOLN
25.0000 mg | Freq: Once | INTRAMUSCULAR | Status: AC
  Administered 2020-10-02: 25 mg via INTRAVENOUS
  Filled 2020-10-02: qty 1

## 2020-10-02 NOTE — ED Notes (Signed)
Out of bed to BR unassisted   Has been brought food and drink by family member

## 2020-10-02 NOTE — ED Provider Notes (Signed)
[facility] EMERGENCY DEPARTMENT Provider Note   CSN: [id number] Arrival date & time: 10/02/20  1226     History Chief Complaint  Patient presents with  . Migraine    Cynthia [name] is a 39 y.o. female.  HPI She presents for evaluation of headache that was present when she woke this morning.  Since then she has had persistent vomiting, photophobia, and did not improved after taking Imitrex at home.  She was seen 2 weeks ago at an urgent care with signs and symptoms of URI.  Since that time she has gradually developed pain in both ears, left greater than right.  She denies fever, chills, diarrhea, cough or chest pain.  She takes suppressive medicine to prevent migraines.  There are no other known modifying factors.    Past Medical History:  Diagnosis Date  . Arthritis   . Asthma    childhood  . Asthma   . Fibromyalgia 2007  . Hypertension    on and off since 2007    Patient Active Problem List   Diagnosis Date Noted  . Body mass index 50.0-59.9, adult (HCC) 12/12/2018  . 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 Constipation 12/10/2017  . Hypersomnia 07/20/2016  . Asthma 07/20/2016  . Anxiety and depression 07/06/2016  . Vitamin D deficiency 07/06/2016  . Seasonal allergies 02/15/2016  . Insomnia 02/15/2016  . Left ankle pain 02/22/2014  . Encounter for routine gynecological examination 02/22/2014  . Sinusitis nasal 02/22/2014  . Migraines 01/15/2014  . Borderline diabetes 01/13/2014  . Morbid obesity (HCC) 01/13/2014  . Essential hypertension, benign 01/13/2014  . Fibromyalgia 01/13/2014  . Chronic pain of right knee 01/13/2014    History reviewed. No pertinent surgical history.   OB History   No obstetric history on file.     Family History  Problem Relation Age of Onset  . Arthritis Mother   . Hypertension Mother   . Alcohol abuse Father   . Arthritis Father   . Asthma Father   . COPD Father   . Hypertension Father   . Heart failure  Father   . Early death Maternal Grandmother   . Hypertension Maternal Grandmother   . Pneumonia Maternal Grandmother   . Hearing loss Maternal Grandfather   . Hypertension Maternal Grandfather   . Stroke Maternal Grandfather   . Hypertension Paternal Grandmother   . Heart disease Paternal Grandmother   . Hypertension Paternal Grandfather   . Heart failure Paternal Grandfather     Social History   Tobacco Use  . Smoking status: Never Smoker  . Smokeless tobacco: Never Used  Substance Use Topics  . Alcohol use: No  . Drug use: No    Home Medications Prior to Admission medications   Medication Sig Start Date End Date Taking? Authorizing Provider  cyclobenzaprine (FLEXERIL) 10 MG tablet TAKE 1 TABLET (10 MG TOTAL) BY MOUTH THREE TIMES DAILY AS NEEDED FOR  MUSCLE  SPASMS 02/13/19   Fulp, Cammie, MD  FLUoxetine (PROZAC) 20 MG capsule TAKE 1 CAPSULE BY MOUTH  DAILY 08/27/19   Fulp, Cammie, MD  loratadine (CLARITIN) 10 MG tablet Take 1 tablet (10 mg total) by mouth daily. 11/30/16   Hoy Register, MD  Lorcaserin HCl (BELVIQ) 10 MG TABS Take 10 mg by mouth 2 (two) times daily. 10/21/18   Hoy Register, MD  MELATONIN PO Take 10 mg by mouth at bedtime.    [provider]  meloxicam (MOBIC) 7.5 MG tablet Take 1 tablet (7.5 mg total)  by mouth daily. 11/18/18   Hoy RegisterNewlin, Enobong, MD  metoprolol tartrate (LOPRESSOR) 50 MG tablet TAKE ONE-HALF TABLET BY  MOUTH TWICE DAILY 04/27/19   Fulp, Cammie, MD  mometasone (NASONEX) 50 MCG/ACT nasal spray Place 2 sprays into the nose daily. 01/19/19   Fulp, Cammie, MD  Multiple Vitamin (MULTIVITAMIN) tablet Take 1 tablet by mouth daily.    [provider]  polyethylene glycol powder (GLYCOLAX/MIRALAX) powder Take 17 g by mouth daily. As needed for constipation 02/09/19   Fulp, Cammie, MD  predniSONE (DELTASONE) 20 MG tablet Take 2 pills x 2 days then 1 pill daily x 2 days then 1/2 pill daily x 4 days, take after eating 01/19/19   Fulp, Cammie, MD    PROAIR HFA 108 (90 Base) MCG/ACT inhaler USE 2 INHALATIONS BY MOUTH  EVERY 4 HOURS AS NEEDED FOR WHEEZING OR SHORTNESS OF  BREATH 04/27/19   Fulp, Cammie, MD  SUMAtriptan (IMITREX) 50 MG tablet Take 1 tab (50mg ) orally at the onset of her migraine, may repeat in 2 hours if migraine persists.  Total daily maximum dose is 200 mg 01/19/19   Fulp, Cammie, MD  topiramate (TOPAMAX) 100 MG tablet TAKE 1 AND 1/2 TABLETS BY  MOUTH 2 TIMES DAILY. FOR  MIGRAINE PREVENTION 04/27/19   Fulp, Cammie, MD  traZODone (DESYREL) 100 MG tablet TAKE 1 TABLET BY MOUTH AT  BEDTIME AS NEEDED FOR SLEEP 06/15/19   Fulp, Cammie, MD  Vitamin D, Ergocalciferol, (DRISDOL) 50000 units CAPS capsule Take 1 capsule (50,000 Units total) by mouth every 7 (seven) days. 07/06/16   Hoy RegisterNewlin, Enobong, MD    Allergies    Shellfish allergy  Review of Systems   Review of Systems  All other systems reviewed and are negative.   Physical Exam Updated Vital Signs BP (!) 160/94 (BP Location: Right Arm)   Pulse 76   Temp 97.8 F (36.6 C) (Oral)   Resp 18   Ht 5\' 6"  (1.676 m)   Wt 126.1 kg   SpO2 99%   BMI 44.87 kg/m   Physical Exam Vitals and nursing note reviewed.  Constitutional:      General: She is not in acute distress.    Appearance: She is well-developed. She is obese. She is ill-appearing. She is not toxic-appearing or diaphoretic.  HENT:     Head: Normocephalic and atraumatic.     Comments: Right external auditory canal and TM are normal.  Left external auditory canal with cerumen blocking TM.    Right Ear: External ear normal.     Left Ear: External ear normal.     Mouth/Throat:     Mouth: Mucous membranes are moist.     Pharynx: No oropharyngeal exudate.  Eyes:     Conjunctiva/sclera: Conjunctivae normal.     Pupils: Pupils are equal, round, and reactive to light.  Neck:     Trachea: Phonation normal.  Cardiovascular:     Rate and Rhythm: Normal rate.  Pulmonary:     Effort: Pulmonary effort is normal.   Abdominal:     General: There is no distension.  Musculoskeletal:        General: Normal range of motion.     Cervical back: Normal range of motion and neck supple.  Skin:    General: Skin is warm and dry.  Neurological:     Mental Status: She is alert and oriented to person, place, and time.     Cranial Nerves: No cranial nerve deficit.  Sensory: No sensory deficit.     Motor: No abnormal muscle tone.     Coordination: Coordination normal.     Comments: No dysarthria, aphasia, nystagmus, pronator drift or ataxia.  Psychiatric:        Mood and Affect: Mood normal.        Behavior: Behavior normal.        Thought Content: Thought content normal.        Judgment: Judgment normal.     ED Results / Procedures / Treatments   Labs (all labs ordered are listed, but only abnormal results are displayed) Labs Reviewed - No data to display  EKG None  Radiology No results found.  Procedures Procedures (including critical care time)  Medications Ordered in ED Medications  sodium chloride 0.9 % bolus 500 mL (has no administration in time range)  metoCLOPramide (REGLAN) injection 10 mg (has no administration in time range)  diphenhydrAMINE (BENADRYL) injection 25 mg (has no administration in time range)  ketorolac (TORADOL) 30 MG/ML injection 30 mg (has no administration in time range)    ED Course  I have reviewed the triage vital signs and the nursing notes.  Pertinent labs & imaging results that were available during my care of the patient were reviewed by me and considered in my medical decision making (see chart for details).    MDM Rules/Calculators/A&P                           Patient Vitals for the past 24 hrs:  BP Temp Temp src Pulse Resp SpO2 Height Weight  10/02/20 1240 -- -- -- -- -- -- 5\' 6"  (1.676 m) 126.1 kg  10/02/20 1239 (!) 160/94 97.8 F (36.6 C) Oral 76 18 99 % -- --    2:48 PM Reevaluation with update and discussion. After initial assessment  and treatment, an updated evaluation reveals patient states her headache has resolved, and she has a mild tingling sensation in her left ear is her only residual problem.  Findings discussed with the patient and her husband, all questions were answered. 10/04/20   Medical Decision Making:  This patient is presenting for evaluation of migraine headache, and ear pain, which does require a range of treatment options, and is a complaint that involves a moderate risk of morbidity and mortality. The differential diagnoses include tension headache, migraine headache, URI/otitis media. I decided to review old records, and in summary healthy middle-aged female presenting for headache that did not improve with home medications taken for migraine.  I did not require additional historical information from anyone.    Critical Interventions-clinical evaluation, medication treatment, observation reassessment  After These Interventions, the Patient was reevaluated and was found stable for discharge.  Patient with typical migraine headache, but did not improve with home treatment and was complicated by vomiting.  Headache resolved after migraine cocktail given.  Incidental left auditory canal cerumen impaction.  I doubt that she has an acute bacterial infection in her ear or sinuses.  CRITICAL CARE-no Performed by: Mancel Bale  Nursing Notes Reviewed/ Care Coordinated Applicable Imaging Reviewed Interpretation of Laboratory Data incorporated into ED treatment  The patient appears reasonably screened and/or stabilized for discharge and I doubt any other medical condition or other [facility] requiring further screening, evaluation, or treatment in the ED at this time prior to discharge.  Plan: Home Medications-continue current; Home Treatments-rest, fluids, earwax removal with warm water and peroxide; return here if the recommended  treatment, does not improve the symptoms; Recommended follow up-PCP, as  needed     Final Clinical Impression(s) / ED Diagnoses Final diagnoses:  Migraine without aura and without status migrainosus, not intractable  Impacted cerumen of left ear    Rx / DC Orders ED Discharge Orders    None       Mancel Bale, MD 10/02/20 1451

## 2020-10-02 NOTE — Discharge Instructions (Addendum)
Continue taking your usual medications.  Use warm water and/or peroxide to help clear the wax from your left ear.  See your primary care doctor as needed for problems.

## 2020-10-02 NOTE — ED Triage Notes (Signed)
Pt ambulatory to er room number 19, pt states that she has a hx of migraines, states that this headache is different, states that normally they have a gradual onset that she can treat at home, however, she awoke today with the headache, states that pain is in her ears as well as her entire head.  Reports some dizziness when standing earlier today.  Denies unilateral weakness.

## 2020-10-02 NOTE — ED Notes (Signed)
Reports pain relief   Sipping pepsi

## 2021-06-26 ENCOUNTER — Encounter: Payer: Self-pay | Admitting: Family Medicine

## 2021-06-26 ENCOUNTER — Other Ambulatory Visit: Payer: Self-pay

## 2021-06-26 ENCOUNTER — Ambulatory Visit (INDEPENDENT_AMBULATORY_CARE_PROVIDER_SITE_OTHER): Payer: 59 | Admitting: Family Medicine

## 2021-06-26 VITALS — BP 136/82 | HR 99 | Resp 16 | Ht 66.0 in | Wt 318.2 lb

## 2021-06-26 DIAGNOSIS — F419 Anxiety disorder, unspecified: Secondary | ICD-10-CM

## 2021-06-26 DIAGNOSIS — Z7689 Persons encountering health services in other specified circumstances: Secondary | ICD-10-CM

## 2021-06-26 DIAGNOSIS — R000 Tachycardia, unspecified: Secondary | ICD-10-CM | POA: Diagnosis not present

## 2021-06-26 DIAGNOSIS — Z6841 Body Mass Index (BMI) 40.0 and over, adult: Secondary | ICD-10-CM

## 2021-06-26 DIAGNOSIS — G43809 Other migraine, not intractable, without status migrainosus: Secondary | ICD-10-CM

## 2021-06-26 DIAGNOSIS — K5909 Other constipation: Secondary | ICD-10-CM | POA: Diagnosis not present

## 2021-06-26 DIAGNOSIS — I10 Essential (primary) hypertension: Secondary | ICD-10-CM

## 2021-06-26 DIAGNOSIS — M940 Chondrocostal junction syndrome [Tietze]: Secondary | ICD-10-CM

## 2021-06-26 DIAGNOSIS — G4709 Other insomnia: Secondary | ICD-10-CM

## 2021-06-26 DIAGNOSIS — F32A Depression, unspecified: Secondary | ICD-10-CM

## 2021-06-26 MED ORDER — ALBUTEROL SULFATE HFA 108 (90 BASE) MCG/ACT IN AERS: INHALATION_SPRAY | RESPIRATORY_TRACT | 0 refills | Status: DC

## 2021-06-26 MED ORDER — TRAZODONE HCL 100 MG PO TABS: 100.0000 mg | ORAL_TABLET | Freq: Every evening | ORAL | 0 refills | Status: DC | PRN

## 2021-06-26 MED ORDER — TOPIRAMATE 100 MG PO TABS: ORAL_TABLET | ORAL | 0 refills | Status: DC

## 2021-06-26 MED ORDER — POLYETHYLENE GLYCOL 3350 17 GM/SCOOP PO POWD: 17.0000 g | Freq: Every day | ORAL | 4 refills | Status: DC

## 2021-06-26 MED ORDER — DULOXETINE HCL 30 MG PO CPEP: 30.0000 mg | ORAL_CAPSULE | Freq: Two times a day (BID) | ORAL | 0 refills | Status: DC

## 2021-06-26 MED ORDER — METOPROLOL TARTRATE 50 MG PO TABS: 25.0000 mg | ORAL_TABLET | Freq: Two times a day (BID) | ORAL | 0 refills | Status: DC

## 2021-06-26 MED ORDER — GABAPENTIN 300 MG PO CAPS: 300.0000 mg | ORAL_CAPSULE | Freq: Three times a day (TID) | ORAL | 1 refills | Status: DC

## 2021-06-26 MED ORDER — MELOXICAM 7.5 MG PO TABS: 7.5000 mg | ORAL_TABLET | Freq: Every day | ORAL | 0 refills | Status: DC

## 2021-06-26 MED ORDER — CYCLOBENZAPRINE HCL 10 MG PO TABS: ORAL_TABLET | ORAL | 0 refills | Status: DC

## 2021-06-26 NOTE — Progress Notes (Signed)
Pt states she has been having right leg pain since Thursday.   Pt states the pain comes from her right lower back down to her leg  Pt states she has been taking Ibuprofen with little relief

## 2021-06-26 NOTE — Progress Notes (Signed)
New Patient Office Visit  Subjective:  Patient ID: Cynthia [name], female    DOB: 12-May-1981  Age: 40 y.o. MRN: [id number]  CC:  Chief Complaint  Patient presents with   Hypertension   New Patient (Initial Visit)    HPI Cynthia [name] presents for to establish care. Patient reports that she had been established for her multiple medical problems with a different entity but has had to switch 2/2 insurance. She has been spending months trying to get authorization to maintain her previous providers but has been unable to as of yet and has run out of her meds. She has no acute complaints.   Past Medical History:  Diagnosis Date   Arthritis    Asthma    childhood   Asthma    Fibromyalgia 2007   Hypertension    on and off since 2007    History reviewed. No pertinent surgical history.  Family History  Problem Relation Age of Onset   Arthritis Mother    Hypertension Mother    Alcohol abuse Father    Arthritis Father    Asthma Father    COPD Father    Hypertension Father    Heart failure Father    Early death Maternal Grandmother    Hypertension Maternal Grandmother    Pneumonia Maternal Grandmother    Hearing loss Maternal Grandfather    Hypertension Maternal Grandfather    Stroke Maternal Grandfather    Hypertension Paternal Grandmother    Heart disease Paternal Grandmother    Hypertension Paternal Grandfather    Heart failure Paternal Grandfather     Social History   Socioeconomic History   Marital status: Single    Spouse name: Not on file   Number of children: Not on file   Years of education: Not on file   Highest education level: Not on file  Occupational History   Occupation: caterer/event planning  Tobacco Use   Smoking status: Never   Smokeless tobacco: Never  Substance and Sexual Activity   Alcohol use: No   Drug use: No   Sexual activity: Yes    Birth control/protection: None  Other Topics Concern   Not on file  Social History Narrative   Not  on file   Social Determinants of Health   Financial Resource Strain: Not on file  Food Insecurity: Not on file  Transportation Needs: Not on file  Physical Activity: Not on file  Stress: Not on file  Social Connections: Not on file  Intimate Partner Violence: Not on file    ROS Review of Systems  Gastrointestinal:  Positive for constipation.  Psychiatric/Behavioral:  Negative for self-injury, sleep disturbance and suicidal ideas. The patient is not nervous/anxious.   All other systems reviewed and are negative.  Objective:   Today's Vitals: BP 136/82   Pulse 99   Resp 16   Ht 5\' 6"  (1.676 m)   Wt (!) 318 lb 3.2 oz (144.3 kg)   SpO2 98%   BMI 51.36 kg/m   Physical Exam Vitals and nursing note reviewed.  Constitutional:      General: She is not in acute distress.    Appearance: She is obese.  Cardiovascular:     Rate and Rhythm: Normal rate and regular rhythm.  Pulmonary:     Effort: Pulmonary effort is normal.     Breath sounds: Normal breath sounds.  Abdominal:     Palpations: Abdomen is soft.     Tenderness: There is no abdominal tenderness.  Neurological:  General: No focal deficit present.     Mental Status: She is alert and oriented to person, place, and time.  Psychiatric:        Mood and Affect: Mood normal.        Behavior: Behavior normal.    Assessment & Plan:   Problem List Items Addressed This Visit       Cardiovascular and Mediastinum   Essential hypertension, benign - Primary   Relevant Medications   metoprolol tartrate (LOPRESSOR) 50 MG tablet   Migraines   Relevant Medications   metoprolol tartrate (LOPRESSOR) 50 MG tablet   topiramate (TOPAMAX) 100 MG tablet   traZODone (DESYREL) 100 MG tablet   cyclobenzaprine (FLEXERIL) 10 MG tablet   DULoxetine (CYMBALTA) 30 MG capsule   gabapentin (NEURONTIN) 300 MG capsule   meloxicam (MOBIC) 7.5 MG tablet     Other   Morbid obesity (HCC)   Insomnia   Relevant Medications   traZODone  (DESYREL) 100 MG tablet   Anxiety and depression   Relevant Medications   traZODone (DESYREL) 100 MG tablet   DULoxetine (CYMBALTA) 30 MG capsule   Constipation   Relevant Medications   polyethylene glycol powder (GLYCOLAX/MIRALAX) 17 GM/SCOOP powder   Other Visit Diagnoses     Tachycardia       Relevant Medications   metoprolol tartrate (LOPRESSOR) 50 MG tablet   Costochondritis       Relevant Medications   meloxicam (MOBIC) 7.5 MG tablet   Encounter to establish care           Outpatient Encounter Medications as of 06/26/2021  Medication Sig   DULoxetine (CYMBALTA) 30 MG capsule Take 1 capsule (30 mg total) by mouth 2 (two) times daily.   gabapentin (NEURONTIN) 300 MG capsule Take 1 capsule (300 mg total) by mouth 3 (three) times daily.   albuterol (PROAIR HFA) 108 (90 Base) MCG/ACT inhaler USE 2 INHALATIONS BY MOUTH  EVERY 4 HOURS AS NEEDED FOR WHEEZING OR SHORTNESS OF  BREATH   cyclobenzaprine (FLEXERIL) 10 MG tablet TAKE 1 TABLET (10 MG TOTAL) BY MOUTH THREE TIMES DAILY AS NEEDED FOR  MUSCLE  SPASMS   Lorcaserin HCl (BELVIQ) 10 MG TABS Take 10 mg by mouth 2 (two) times daily. (Patient not taking: Reported on 06/26/2021)   meloxicam (MOBIC) 7.5 MG tablet Take 1 tablet (7.5 mg total) by mouth daily.   metoprolol tartrate (LOPRESSOR) 50 MG tablet Take 0.5 tablets (25 mg total) by mouth 2 (two) times daily.   mometasone (NASONEX) 50 MCG/ACT nasal spray Place 2 sprays into the nose daily. (Patient not taking: Reported on 06/26/2021)   Multiple Vitamin (MULTIVITAMIN) tablet Take 1 tablet by mouth daily. (Patient not taking: Reported on 06/26/2021)   polyethylene glycol powder (GLYCOLAX/MIRALAX) 17 GM/SCOOP powder Take 17 g by mouth daily. As needed for constipation   SUMAtriptan (IMITREX) 50 MG tablet Take 1 tab (50mg ) orally at the onset of her migraine, may repeat in 2 hours if migraine persists.  Total daily maximum dose is 200 mg (Patient not taking: Reported on 06/26/2021)   topiramate  (TOPAMAX) 100 MG tablet TAKE 1 AND 1/2 TABLETS BY  MOUTH 2 TIMES DAILY. FOR  MIGRAINE PREVENTION   traZODone (DESYREL) 100 MG tablet Take 1 tablet (100 mg total) by mouth at bedtime as needed. for sleep   [DISCONTINUED] cyclobenzaprine (FLEXERIL) 10 MG tablet TAKE 1 TABLET (10 MG TOTAL) BY MOUTH THREE TIMES DAILY AS NEEDED FOR  MUSCLE  SPASMS (Patient not taking: Reported on  06/26/2021)   [DISCONTINUED] FLUoxetine (PROZAC) 20 MG capsule TAKE 1 CAPSULE BY MOUTH  DAILY (Patient not taking: Reported on 06/26/2021)   [DISCONTINUED] loratadine (CLARITIN) 10 MG tablet Take 1 tablet (10 mg total) by mouth daily. (Patient not taking: Reported on 06/26/2021)   [DISCONTINUED] MELATONIN PO Take 10 mg by mouth at bedtime. (Patient not taking: Reported on 06/26/2021)   [DISCONTINUED] meloxicam (MOBIC) 7.5 MG tablet Take 1 tablet (7.5 mg total) by mouth daily. (Patient not taking: Reported on 06/26/2021)   [DISCONTINUED] metoprolol tartrate (LOPRESSOR) 50 MG tablet TAKE ONE-HALF TABLET BY  MOUTH TWICE DAILY (Patient not taking: Reported on 06/26/2021)   [DISCONTINUED] polyethylene glycol powder (GLYCOLAX/MIRALAX) powder Take 17 g by mouth daily. As needed for constipation (Patient not taking: Reported on 06/26/2021)   [DISCONTINUED] predniSONE (DELTASONE) 20 MG tablet Take 2 pills x 2 days then 1 pill daily x 2 days then 1/2 pill daily x 4 days, take after eating (Patient not taking: Reported on 06/26/2021)   [DISCONTINUED] PROAIR HFA 108 (90 Base) MCG/ACT inhaler USE 2 INHALATIONS BY MOUTH  EVERY 4 HOURS AS NEEDED FOR WHEEZING OR SHORTNESS OF  BREATH (Patient not taking: Reported on 06/26/2021)   [DISCONTINUED] topiramate (TOPAMAX) 100 MG tablet TAKE 1 AND 1/2 TABLETS BY  MOUTH 2 TIMES DAILY. FOR  MIGRAINE PREVENTION (Patient not taking: Reported on 06/26/2021)   [DISCONTINUED] traZODone (DESYREL) 100 MG tablet TAKE 1 TABLET BY MOUTH AT  BEDTIME AS NEEDED FOR SLEEP (Patient not taking: Reported on 06/26/2021)   [DISCONTINUED] Vitamin D,  Ergocalciferol, (DRISDOL) 50000 units CAPS capsule Take 1 capsule (50,000 Units total) by mouth every 7 (seven) days. (Patient not taking: Reported on 06/26/2021)   No facility-administered encounter medications on file as of 06/26/2021.    Follow-up: Return in about 3 months (around 09/26/2021) for follow up, chronic med issues.   Tommie Raymond, MD

## 2021-07-11 ENCOUNTER — Ambulatory Visit: Payer: 59 | Admitting: Nurse Practitioner

## 2021-07-19 ENCOUNTER — Other Ambulatory Visit: Payer: Self-pay | Admitting: Family Medicine

## 2021-08-20 ENCOUNTER — Other Ambulatory Visit: Payer: Self-pay | Admitting: Family Medicine

## 2021-08-20 DIAGNOSIS — R000 Tachycardia, unspecified: Secondary | ICD-10-CM

## 2021-08-21 ENCOUNTER — Other Ambulatory Visit: Payer: Self-pay | Admitting: Family Medicine

## 2021-08-21 DIAGNOSIS — G4709 Other insomnia: Secondary | ICD-10-CM

## 2021-08-22 NOTE — Telephone Encounter (Signed)
Patient request

## 2021-08-27 ENCOUNTER — Other Ambulatory Visit: Payer: Self-pay | Admitting: Family Medicine

## 2021-08-27 DIAGNOSIS — M940 Chondrocostal junction syndrome [Tietze]: Secondary | ICD-10-CM

## 2021-09-03 ENCOUNTER — Other Ambulatory Visit: Payer: Self-pay | Admitting: Family Medicine

## 2021-09-26 ENCOUNTER — Ambulatory Visit: Payer: 59 | Admitting: Family Medicine

## 2022-06-04 LAB — HM MAMMOGRAPHY

## 2022-06-19 ENCOUNTER — Emergency Department (HOSPITAL_COMMUNITY): Payer: 59

## 2022-06-19 ENCOUNTER — Encounter (HOSPITAL_COMMUNITY): Payer: Self-pay | Admitting: Emergency Medicine

## 2022-06-19 ENCOUNTER — Emergency Department (HOSPITAL_COMMUNITY)
Admission: EM | Admit: 2022-06-19 | Discharge: 2022-06-19 | Disposition: A | Discharge status: Home / Self Care | Payer: 59 | Attending: Emergency Medicine | Admitting: Emergency Medicine

## 2022-06-19 ENCOUNTER — Other Ambulatory Visit: Payer: Self-pay

## 2022-06-19 DIAGNOSIS — R519 Headache, unspecified: Secondary | ICD-10-CM

## 2022-06-19 LAB — BASIC METABOLIC PANEL
Anion gap: 6 (ref 5–15)
BUN: 12 mg/dL (ref 6–20)
CO2: 29 mmol/L (ref 22–32)
Calcium: 8.6 mg/dL — ABNORMAL LOW (ref 8.9–10.3)
Chloride: 101 mmol/L (ref 98–111)
Creatinine, Ser: 0.9 mg/dL (ref 0.44–1.00)
GFR, Estimated: >60 mL/min (ref >60)
Glucose, Bld: 112 mg/dL — ABNORMAL HIGH (ref 70–99)
Potassium: 3.7 mmol/L (ref 3.5–5.1)
Sodium: 136 mmol/L (ref 135–145)

## 2022-06-19 LAB — CBC WITH DIFFERENTIAL/PLATELET
Abs Immature Granulocytes: 0.02 10*3/uL (ref 0.00–0.07)
Basophils Absolute: 0 10*3/uL (ref 0.0–0.1)
Basophils Relative: 1 %
Eosinophils Absolute: 0.1 10*3/uL (ref 0.0–0.5)
Eosinophils Relative: 1 %
HCT: 40 % (ref 36.0–46.0)
Hemoglobin: 12.7 g/dL (ref 12.0–15.0)
Immature Granulocytes: 0 %
Lymphocytes Relative: 27 %
Lymphs Abs: 1.6 10*3/uL (ref 0.7–4.0)
MCH: 26.3 pg (ref 26.0–34.0)
MCHC: 31.8 g/dL (ref 30.0–36.0)
MCV: 83 fL (ref 80.0–100.0)
Monocytes Absolute: 0.5 10*3/uL (ref 0.1–1.0)
Monocytes Relative: 8 %
Neutro Abs: 3.8 10*3/uL (ref 1.7–7.7)
Neutrophils Relative %: 63 %
Platelets: 331 10*3/uL (ref 150–400)
RBC: 4.82 MIL/uL (ref 3.87–5.11)
RDW: 16.5 % — ABNORMAL HIGH (ref 11.5–15.5)
WBC: 6 10*3/uL (ref 4.0–10.5)
nRBC: 0 % (ref 0.0–0.2)

## 2022-06-19 MED ORDER — KETOROLAC TROMETHAMINE 30 MG/ML IJ SOLN
15.0000 mg | Freq: Once | INTRAMUSCULAR | Status: AC
  Administered 2022-06-19: 15 mg via INTRAVENOUS
  Filled 2022-06-19: qty 1

## 2022-06-19 MED ORDER — SODIUM CHLORIDE 0.9 % IV BOLUS
1000.0000 mL | Freq: Once | INTRAVENOUS | Status: AC
  Administered 2022-06-19: 1000 mL via INTRAVENOUS

## 2022-06-19 MED ORDER — DIPHENHYDRAMINE HCL 50 MG/ML IJ SOLN
25.0000 mg | Freq: Once | INTRAMUSCULAR | Status: AC
  Administered 2022-06-19: 25 mg via INTRAVENOUS
  Filled 2022-06-19: qty 1

## 2022-06-19 MED ORDER — PROCHLORPERAZINE EDISYLATE 10 MG/2ML IJ SOLN
10.0000 mg | Freq: Once | INTRAMUSCULAR | Status: AC
  Administered 2022-06-19: 10 mg via INTRAVENOUS
  Filled 2022-06-19: qty 2

## 2022-06-19 MED ORDER — LORAZEPAM 2 MG/ML IJ SOLN
1.0000 mg | Freq: Once | INTRAMUSCULAR | Status: AC
  Administered 2022-06-19: 1 mg via INTRAVENOUS
  Filled 2022-06-19: qty 1

## 2022-06-19 MED ORDER — DEXAMETHASONE SODIUM PHOSPHATE 10 MG/ML IJ SOLN
10.0000 mg | Freq: Once | INTRAMUSCULAR | Status: AC
  Administered 2022-06-19: 10 mg via INTRAVENOUS
  Filled 2022-06-19: qty 1

## 2022-06-19 NOTE — ED Provider Notes (Signed)
[facility] EMERGENCY DEPARTMENT Provider Note   CSN: [id number] Arrival date & time: 06/19/22  2725     History {Add pertinent medical, surgical, social history, OB history to HPI:1} Chief Complaint  Patient presents with   Headache    Cynthia [name] is a 41 y.o. female.  Patient presents to the emergency department for evaluation of headache.  Patient has been experiencing a headache for more than 24 hours.  She does have a history of migraines.  She takes Topamax for her migraines.  She reports that it has been more than a year since she has had a bad headache.  Patient reports light sensitivity, nausea and vomiting.  She has not had the symptoms associated with a headache in approximately 4 years.  Pain is mostly on the right side of her head down into her neck.       Home Medications Prior to Admission medications   Medication Sig Start Date End Date Taking? Authorizing Provider  albuterol (VENTOLIN HFA) 108 (90 Base) MCG/ACT inhaler USE 2 INHALATIONS BY MOUTH  EVERY 4 HOURS AS NEEDED FOR WHEEZING OR SHORTNESS OF  BREATH 09/04/21   Georganna Skeans, MD  cyclobenzaprine (FLEXERIL) 10 MG tablet TAKE 1 TABLET (10 MG TOTAL) BY MOUTH THREE TIMES DAILY AS NEEDED FOR  MUSCLE  SPASMS 06/26/21   Georganna Skeans, MD  DULoxetine (CYMBALTA) 30 MG capsule TAKE 1 CAPSULE BY MOUTH  TWICE DAILY 08/31/21   Georganna Skeans, MD  gabapentin (NEURONTIN) 300 MG capsule TAKE 1 CAPSULE BY MOUTH 3  TIMES DAILY 07/21/21   Georganna Skeans, MD  Lorcaserin HCl (BELVIQ) 10 MG TABS Take 10 mg by mouth 2 (two) times daily. Patient not taking: Reported on 06/26/2021 10/21/18   Hoy Register, MD  meloxicam (MOBIC) 7.5 MG tablet TAKE 1 TABLET BY MOUTH  DAILY 08/31/21   Georganna Skeans, MD  metoprolol tartrate (LOPRESSOR) 50 MG tablet TAKE ONE-HALF TABLET BY  MOUTH TWICE DAILY 08/21/21   Georganna Skeans, MD  mometasone (NASONEX) 50 MCG/ACT nasal spray Place 2 sprays into the nose daily. Patient not taking: Reported on  06/26/2021 01/19/19   Cain Saupe, MD  Multiple Vitamin (MULTIVITAMIN) tablet Take 1 tablet by mouth daily. Patient not taking: Reported on 06/26/2021    [provider]  polyethylene glycol powder (GLYCOLAX/MIRALAX) 17 GM/SCOOP powder Take 17 g by mouth daily. As needed for constipation 06/26/21   Georganna Skeans, MD  SUMAtriptan (IMITREX) 50 MG tablet Take 1 tab (50mg ) orally at the onset of her migraine, may repeat in 2 hours if migraine persists.  Total daily maximum dose is 200 mg Patient not taking: Reported on 06/26/2021 01/19/19   Fulp, Cammie, MD  topiramate (TOPAMAX) 100 MG tablet TAKE 1 AND 1/2 TABLETS BY  MOUTH 2 TIMES DAILY. FOR  MIGRAINE PREVENTION 06/26/21   08/26/21, MD  traZODone (DESYREL) 100 MG tablet TAKE 1 TABLET BY MOUTH AT  BEDTIME AS NEEDED. FOR  SLEEP 09/04/21   09/06/21, MD      Allergies    Shellfish allergy    Review of Systems   Review of Systems  Physical Exam Updated Vital Signs BP (!) 169/96   Pulse 97   Temp 97.8 F (36.6 C) (Oral)   Resp 20   Ht 5\' 6"  (1.676 m)   Wt (!) 140.6 kg   LMP 06/11/2022   SpO2 100%   BMI 50.04 kg/m  Physical Exam Vitals and nursing note reviewed.  Constitutional:      General:  She is not in acute distress.    Appearance: She is well-developed.  HENT:     Head: Normocephalic and atraumatic.     Mouth/Throat:     Mouth: Mucous membranes are moist.  Eyes:     General: Vision grossly intact. Gaze aligned appropriately.     Extraocular Movements: Extraocular movements intact.     Conjunctiva/sclera: Conjunctivae normal.  Cardiovascular:     Rate and Rhythm: Normal rate and regular rhythm.     Pulses: Normal pulses.     Heart sounds: Normal heart sounds, S1 normal and S2 normal. No murmur heard.    No friction rub. No gallop.  Pulmonary:     Effort: Pulmonary effort is normal. No respiratory distress.     Breath sounds: Normal breath sounds.  Abdominal:     General: Bowel sounds are normal.      Palpations: Abdomen is soft.     Tenderness: There is no abdominal tenderness. There is no guarding or rebound.     Hernia: No hernia is present.  Musculoskeletal:        General: No swelling.     Cervical back: Full passive range of motion without pain, normal range of motion and neck supple. No spinous process tenderness or muscular tenderness. Normal range of motion.     Right lower leg: No edema.     Left lower leg: No edema.  Skin:    General: Skin is warm and dry.     Capillary Refill: Capillary refill takes less than 2 seconds.     Findings: No ecchymosis, erythema, rash or wound.  Neurological:     General: No focal deficit present.     Mental Status: She is alert and oriented to person, place, and time.     GCS: GCS eye subscore is 4. GCS verbal subscore is 5. GCS motor subscore is 6.     Cranial Nerves: Cranial nerves 2-12 are intact.     Sensory: Sensation is intact.     Motor: Motor function is intact.     Coordination: Coordination is intact.  Psychiatric:        Attention and Perception: Attention normal.        Mood and Affect: Mood normal.        Speech: Speech normal.        Behavior: Behavior normal.     ED Results / Procedures / Treatments   Labs (all labs ordered are listed, but only abnormal results are displayed) Labs Reviewed - No data to display  EKG None  Radiology No results found.  Procedures Procedures  {Document cardiac monitor, telemetry assessment procedure when appropriate:1}  Medications Ordered in ED Medications  sodium chloride 0.9 % bolus 1,000 mL (has no administration in time range)  ketorolac (TORADOL) 30 MG/ML injection 15 mg (has no administration in time range)  prochlorperazine (COMPAZINE) injection 10 mg (has no administration in time range)  diphenhydrAMINE (BENADRYL) injection 25 mg (has no administration in time range)  dexamethasone (DECADRON) injection 10 mg (has no administration in time range)    ED Course/ Medical  Decision Making/ A&P                           Medical Decision Making Amount and/or Complexity of Data Reviewed Labs: ordered. Radiology: ordered.  Risk Prescription drug management.   ***  {Document critical care time when appropriate:1} {Document review of labs and clinical decision tools ie heart  score, Chads2Vasc2 etc:1}  {Document your independent review of radiology images, and any outside records:1} {Document your discussion with family members, caretakers, and with consultants:1} {Document social determinants of health affecting pt's care:1} {Document your decision making why or why not admission, treatments were needed:1} Final Clinical Impression(s) / ED Diagnoses Final diagnoses:  None    Rx / DC Orders ED Discharge Orders     None

## 2022-06-19 NOTE — ED Triage Notes (Signed)
Pt c/o headache with n/v since Sunday.

## 2022-06-19 NOTE — Discharge Instructions (Signed)
Follow-up with your primary care doctor.  Return to the emergency department for any return of concerning symptoms.

## 2022-06-19 NOTE — ED Provider Notes (Signed)
Care of patient assumed from Dr. Oletta Cohn at 7:15 AM.  This patient presented with a migraine headache and received a headache cocktail.  Following Compazine, she had akathisia and received Benadryl and Ativan.  She is currently sleeping.  Reassess.  Discharge is anticipated. Physical Exam  BP (!) 169/96   Pulse 97   Temp 97.8 F (36.6 C) (Oral)   Resp 20   Ht 5\' 6"  (1.676 m)   Wt (!) 140.6 kg   LMP 06/11/2022   SpO2 100%   BMI 50.04 kg/m   Physical Exam Vitals and nursing note reviewed.  Constitutional:      General: She is not in acute distress.    Appearance: She is well-developed. She is not ill-appearing, toxic-appearing or diaphoretic.  HENT:     Head: Normocephalic and atraumatic.  Eyes:     Extraocular Movements: Extraocular movements intact.     Conjunctiva/sclera: Conjunctivae normal.  Cardiovascular:     Rate and Rhythm: Normal rate and regular rhythm.  Pulmonary:     Effort: Pulmonary effort is normal. No respiratory distress.  Abdominal:     General: There is no distension.  Musculoskeletal:        General: Normal range of motion.     Cervical back: Normal range of motion and neck supple.  Skin:    General: Skin is warm and dry.  Neurological:     Mental Status: She is alert and oriented to person, place, and time.  Psychiatric:        Mood and Affect: Mood normal.        Behavior: Behavior normal.     Procedures  Procedures  ED Course / MDM    Medical Decision Making Amount and/or Complexity of Data Reviewed Labs: ordered. Radiology: ordered.  Risk Prescription drug management.   On assessment, patient reports resolved akathisia.  She reports near complete resolution of headache.  She does request discharge home at this time.  Patient was discharged in good condition.       06/13/2022, MD 06/19/22 1000

## 2022-07-20 ENCOUNTER — Other Ambulatory Visit: Payer: Self-pay | Admitting: Family Medicine

## 2022-08-23 ENCOUNTER — Encounter: Payer: Self-pay | Admitting: Orthopaedic Surgery

## 2022-08-23 ENCOUNTER — Ambulatory Visit (INDEPENDENT_AMBULATORY_CARE_PROVIDER_SITE_OTHER): Payer: 59 | Admitting: Orthopaedic Surgery

## 2022-08-23 ENCOUNTER — Ambulatory Visit (INDEPENDENT_AMBULATORY_CARE_PROVIDER_SITE_OTHER): Payer: 59

## 2022-08-23 VITALS — Ht 66.0 in | Wt 314.0 lb

## 2022-08-23 DIAGNOSIS — M1712 Unilateral primary osteoarthritis, left knee: Secondary | ICD-10-CM

## 2022-08-23 DIAGNOSIS — Z6841 Body Mass Index (BMI) 40.0 and over, adult: Secondary | ICD-10-CM

## 2022-08-23 DIAGNOSIS — M1711 Unilateral primary osteoarthritis, right knee: Secondary | ICD-10-CM | POA: Diagnosis not present

## 2022-08-23 DIAGNOSIS — M170 Bilateral primary osteoarthritis of knee: Secondary | ICD-10-CM

## 2022-08-23 MED ORDER — BUPIVACAINE HCL 0.5 % IJ SOLN
2.0000 mL | INTRAMUSCULAR | Status: AC | PRN
  Administered 2022-08-23: 2 mL via INTRA_ARTICULAR

## 2022-08-23 MED ORDER — LIDOCAINE HCL 1 % IJ SOLN
2.0000 mL | INTRAMUSCULAR | Status: AC | PRN
  Administered 2022-08-23: 2 mL

## 2022-08-23 MED ORDER — METHYLPREDNISOLONE ACETATE 40 MG/ML IJ SUSP
40.0000 mg | INTRAMUSCULAR | Status: AC | PRN
  Administered 2022-08-23: 40 mg via INTRA_ARTICULAR

## 2022-08-23 MED ORDER — DICLOFENAC SODIUM 75 MG PO TBEC: 75.0000 mg | DELAYED_RELEASE_TABLET | Freq: Two times a day (BID) | ORAL | 2 refills | Status: DC | PRN

## 2022-08-23 MED ORDER — TRAMADOL HCL 50 MG PO TABS: 50.0000 mg | ORAL_TABLET | Freq: Two times a day (BID) | ORAL | 0 refills | Status: DC | PRN

## 2022-08-23 NOTE — Progress Notes (Signed)
Office Visit Note   Patient: Cynthia [name]           Date of Birth: Mar 26, 1981           MRN: [id number] Visit Date: 08/23/2022              Requested by: Dorna Mai, [address] [place],  [place] [id number] PCP: Dorna Mai, MD   Assessment & Plan: Visit Diagnoses:  1. Primary osteoarthritis of both knees   2. Body mass index 50.0-59.9, adult ([place])   3. Morbid obesity ([place])     Plan: Impression is bilateral knee osteoarthritis medial and patellofemoral compartments.  Today, we discussed various treatment options to include weight loss, repeat cortisone injection versus viscosupplementation injection.  She would like to try cortisone injections to both knees today.  If her symptoms do not significantly improve she will let us know we will get approval for viscosupplementation injection.  Otherwise, follow-up with [place] as needed.  The patient meets the AMA guidelines for Morbid (severe) obesity with a BMI > 40.0 and I have recommended weight loss.  Follow-Up Instructions: Return if symptoms worsen or fail to improve.   Orders:  Orders Placed This Encounter  Procedures   Large Joint Inj   XR KNEE 3 VIEW LEFT   Meds ordered this encounter  Medications   traMADol (ULTRAM) 50 MG tablet    Sig: Take 1 tablet (50 mg total) by mouth every 12 (twelve) hours as needed.    Dispense:  30 tablet    Refill:  0   diclofenac (VOLTAREN) 75 MG EC tablet    Sig: Take 1 tablet (75 mg total) by mouth 2 (two) times daily as needed.    Dispense:  60 tablet    Refill:  2      Procedures: Large Joint Inj: bilateral knee on 08/23/2022 11:24 AM Indications: pain Details: 22 G needle  Arthrogram: No  Medications (Right): 2 mL lidocaine 1 %; 2 mL bupivacaine 0.5 %; 40 mg methylPREDNISolone acetate 40 MG/ML Medications (Left): 2 mL lidocaine 1 %; 2 mL bupivacaine 0.5 %; 40 mg methylPREDNISolone acetate 40 MG/ML Outcome: tolerated well, no immediate complications Patient  was prepped and draped in the usual sterile fashion.       Clinical Data: No additional findings.   Subjective: Chief Complaint  Patient presents with   Left Knee - Pain    HPI patient is a pleasant 41 year old female who comes in today with bilateral knee pain left greater than right.  This is been ongoing for many years and has progressively worsened.  Pain she has is primarily to the anterior aspect.  This is constant but worse when she is at work on her feet all day as well as with stair climbing.  She has been taking Tylenol and Aleve without significant relief.  She was seen in our office about 3 years ago where cortisone injection to the left knee was performed.  Unfortunately, this provided only temporary relief.  No previous viscosupplementation injection.  She has not been to physical therapy.  Review of Systems as detailed in HPI.  All others reviewed and are negative.   Objective: Vital Signs: Ht 5\' 6"  (1.676 m)   Wt (!) 314 lb (142.4 kg)   BMI 50.68 kg/m   Physical Exam well-developed and well-nourished female in no acute distress.  Alert and oriented x3.  Ortho Exam bilateral knee exam shows a small effusion.  Range of motion 0 to  95 degrees.  Medial joint line tenderness.  Mild patellofemoral crepitus.  Ligaments are stable.  She is neurovascular tact distally.  Specialty Comments:  No specialty comments available.  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   Constipation 12/10/2017   Hypersomnia 07/20/2016   Asthma 07/20/2016   Anxiety and depression 07/06/2016   Vitamin D deficiency 07/06/2016   Seasonal allergies 02/15/2016   Insomnia 02/15/2016   Left  ankle pain 02/22/2014   Encounter for routine gynecological examination 02/22/2014   Sinusitis nasal 02/22/2014   Migraines 01/15/2014   Borderline diabetes 01/13/2014   Morbid obesity ([place]) 01/13/2014   Essential hypertension, benign 01/13/2014   Fibromyalgia 01/13/2014   Chronic pain of right knee 01/13/2014   Past Medical History:  Diagnosis Date   Arthritis    Asthma    childhood   Asthma    Fibromyalgia 2007   Hypertension    on and off since 2007    Family History  Problem Relation Age of Onset   Arthritis Mother    Hypertension Mother    Alcohol abuse Father    Arthritis Father    Asthma Father    COPD Father    Hypertension Father    Heart failure Father    Early death Maternal Grandmother    Hypertension Maternal Grandmother    Pneumonia Maternal Grandmother    Hearing loss Maternal Grandfather    Hypertension Maternal Grandfather    Stroke Maternal Grandfather    Hypertension Paternal Grandmother    Heart disease Paternal Grandmother    Hypertension Paternal Grandfather    Heart failure Paternal Grandfather     History reviewed. No pertinent surgical history. Social History   Occupational History   Occupation: caterer/event planning  Tobacco Use   Smoking status: Never   Smokeless tobacco: Never  Substance and Sexual Activity   Alcohol use: No   Drug use: No   Sexual activity: Yes    Birth control/protection: None

## 2022-09-17 ENCOUNTER — Encounter: Payer: Self-pay | Admitting: Physician Assistant

## 2022-09-17 ENCOUNTER — Ambulatory Visit (INDEPENDENT_AMBULATORY_CARE_PROVIDER_SITE_OTHER): Payer: 59 | Admitting: Physician Assistant

## 2022-09-17 VITALS — BP 173/110 | HR 88 | Temp 97.8°F | Ht 66.0 in | Wt 343.8 lb

## 2022-09-17 DIAGNOSIS — Z23 Encounter for immunization: Secondary | ICD-10-CM

## 2022-09-17 DIAGNOSIS — M170 Bilateral primary osteoarthritis of knee: Secondary | ICD-10-CM

## 2022-09-17 DIAGNOSIS — R350 Frequency of micturition: Secondary | ICD-10-CM

## 2022-09-17 DIAGNOSIS — R000 Tachycardia, unspecified: Secondary | ICD-10-CM

## 2022-09-17 DIAGNOSIS — G4709 Other insomnia: Secondary | ICD-10-CM

## 2022-09-17 DIAGNOSIS — R002 Palpitations: Secondary | ICD-10-CM | POA: Diagnosis not present

## 2022-09-17 DIAGNOSIS — I10 Essential (primary) hypertension: Secondary | ICD-10-CM

## 2022-09-17 DIAGNOSIS — Z131 Encounter for screening for diabetes mellitus: Secondary | ICD-10-CM | POA: Diagnosis not present

## 2022-09-17 DIAGNOSIS — M797 Fibromyalgia: Secondary | ICD-10-CM | POA: Diagnosis not present

## 2022-09-17 LAB — POC URINALSYSI DIPSTICK (AUTOMATED)
Bilirubin, UA: NEGATIVE
Glucose, UA: NEGATIVE
Ketones, UA: NEGATIVE
Leukocytes, UA: NEGATIVE
Nitrite, UA: NEGATIVE
Protein, UA: NEGATIVE
Spec Grav, UA: 1.02 (ref 1.010–1.025)
Urobilinogen, UA: 0.2 E.U./dL
pH, UA: 6 (ref 5.0–8.0)

## 2022-09-17 LAB — POCT GLYCOSYLATED HEMOGLOBIN (HGB A1C): Hemoglobin A1C: 5.6 % (ref 4.0–5.6)

## 2022-09-17 MED ORDER — AMLODIPINE BESYLATE 2.5 MG PO TABS: 2.5000 mg | ORAL_TABLET | Freq: Every day | ORAL | 0 refills | Status: DC

## 2022-09-17 MED ORDER — METOPROLOL TARTRATE 50 MG PO TABS: 25.0000 mg | ORAL_TABLET | Freq: Two times a day (BID) | ORAL | 3 refills | Status: DC

## 2022-09-17 NOTE — Progress Notes (Unsigned)
Subjective:    Patient ID: Cynthia [name], female    DOB: Jul 24, 1981, 41 y.o.   MRN: [id number]  Chief Complaint  Patient presents with   New Patient (Initial Visit)    Np in office to establish care with PCP; c/o constant need to use restroom; needs to get back on medication; until Covid was going to [facility] insurance change and needed to come back to [facility]. Not on narcotic medication because needing to work, Pt needs referral to GYN to get up to date on Pap smear;     HPI 41 y.o. patient presents today for new patient establishment with me.  Patient was previously established with Dr. Redmond Pulling. Event caterer and planner, and also works full-time job.   She was going to [facility] until Dec 2021, then insurance changed.  Had gotten down to 250 lbs through their wt loss program, now back to 343 lbs.   Current Care Team: Dr. Erlinda Hong - bilateral primary OA knees   Acute Concerns: Constant need to urinate - some days every 10-15 minutes. No burning or discharge. Going on the last three weeks.  BP last two months has been 140/98 (lowest reading); 150/102 avg for the last week  Chronic Concerns: See PMH listed below, as well as A/P for details on issues we specifically discussed during today's visit.      Past Medical History:  Diagnosis Date   Arthritis    Asthma    childhood   Asthma    Fibromyalgia 2007   Hypertension    on and off since 2007   Seasonal allergies     History reviewed. No pertinent surgical history.  Family History  Problem Relation Age of Onset   Arthritis Mother    Hypertension Mother    Alcohol abuse Father    Arthritis Father    Asthma Father    COPD Father    Hypertension Father    Heart failure Father    Early death Maternal Grandmother    Hypertension Maternal Grandmother    Pneumonia Maternal Grandmother    Hearing loss Maternal Grandfather    Hypertension Maternal Grandfather    Stroke Maternal Grandfather     Hypertension Paternal Grandmother    Heart disease Paternal Grandmother    Hypertension Paternal Grandfather    Heart failure Paternal Grandfather     Social History   Tobacco Use   Smoking status: Never   Smokeless tobacco: Never  Vaping Use   Vaping Use: Never used  Substance Use Topics   Alcohol use: No   Drug use: No     Allergies  Allergen Reactions   Compazine [Prochlorperazine] Itching and Anxiety   Shellfish Allergy Rash    Review of Systems NEGATIVE UNLESS OTHERWISE INDICATED IN HPI      Objective:     BP (!) 173/110 (BP Location: Right Arm)   Pulse 88   Temp 97.8 F (36.6 C) (Temporal)   Ht 5\' 6"  (1.676 m)   Wt (!) 343 lb 12.8 oz (155.9 kg)   LMP 09/14/2022 (Exact Date)   SpO2 99%   BMI 55.49 kg/m   Wt Readings from Last 3 Encounters:  09/17/22 (!) 343 lb 12.8 oz (155.9 kg)  08/23/22 (!) 314 lb (142.4 kg)  06/19/22 (!) 310 lb (140.6 kg)    BP Readings from Last 3 Encounters:  09/17/22 (!) 173/110  06/19/22 (!) 164/99  06/26/21 136/82     Physical Exam Constitutional:  Appearance: Normal appearance. She is morbidly obese.  HENT:     Right Ear: Tympanic membrane, ear canal and external ear normal.     Left Ear: Tympanic membrane, ear canal and external ear normal.  Cardiovascular:     Rate and Rhythm: Normal rate and regular rhythm.     Pulses: Normal pulses.     Heart sounds: Normal heart sounds.  Pulmonary:     Effort: Pulmonary effort is normal.     Breath sounds: Normal breath sounds.  Abdominal:     General: Abdomen is flat.     Palpations: Abdomen is soft. There is no mass.     Tenderness: There is no abdominal tenderness.  Neurological:     Mental Status: She is alert.  Psychiatric:        Mood and Affect: Mood normal.        Behavior: Behavior normal.        Assessment & Plan:  Fibromyalgia Assessment & Plan: Previously was seeing [facility] medical [facility] She is taking diclofenac 75 mg twice daily She is  taking tramadol 50 mg up to twice daily as needed   Primary hypertension Assessment & Plan: Blood pressure is very elevated She is asymptomatic Start on Norvasc 2.5 mg daily She is also taking Lopressor 50 mg 1/2 tablet twice daily We plan to monitor closely   Palpitations Assessment & Plan: This is controlled with Lopressor 50 mg 1/2 tablet twice daily   Urinary frequency Assessment & Plan: Point-of-care A1c rule out diabetes Plan of care urinalysis and culture, treat pending results  Orders: -     POCT Urinalysis Dipstick (Automated) -     POCT glycosylated hemoglobin (Hb A1C)  Morbid obesity (HCC) Assessment & Plan: She was previously successful with [facility] weight loss program, but no longer can see them because of insurance issues.  Plan to discuss with her ways to lose weight in the upcoming months.  First thing is to cut back on fast food.   Primary osteoarthritis of both knees Assessment & Plan: Follows with Dr. Roda Shutters   Need for prophylactic vaccination with combined diphtheria-tetanus-pertussis (DTP) vaccine -     Tdap vaccine greater than or equal to 7yo IM  Screening for diabetes mellitus -     POCT glycosylated hemoglobin (Hb A1C)  Tachycardia -     Metoprolol Tartrate; Take 0.5 tablets (25 mg total) by mouth 2 (two) times daily.  Dispense: 90 tablet; Refill: 3  Need for immunization against influenza -     Flu Vaccine QUAD 64mo+IM (Fluarix, Fluzone & Alfiuria Quad PF)  Other orders -     amLODIPine Besylate; Take 1 tablet (2.5 mg total) by mouth daily.  Dispense: 30 tablet; Refill: 0        Return in about 4 weeks (around 10/15/2022) for recheck, fasting labs, CPE.  This note was prepared with assistance of Conservation officer, historic buildings. Occasional wrong-word or sound-a-like substitutions may have occurred due to the inherent limitations of voice recognition software.  Time Spent: 50 minutes of total time was spent on the date of the  encounter performing the following actions: chart review prior to seeing the patient, obtaining history, performing a medically necessary exam, counseling on the treatment plan, placing orders, and documenting in our EHR.       Kirsi Hugh M Kostantinos Tallman, PA-C

## 2022-09-17 NOTE — Telephone Encounter (Signed)
Please see pt msg and advise 

## 2022-09-17 NOTE — Assessment & Plan Note (Signed)
Previously was seeing [facility] medical clinic She is taking diclofenac 75 mg twice daily She is taking tramadol 50 mg up to twice daily as needed

## 2022-09-17 NOTE — Patient Instructions (Addendum)
Welcome to [facility]! It was a pleasure meeting you today.  As discussed, Please schedule a 1 month follow up visit today for fasting labs / CPE.   POC Ha1c was 5.6% - not diabetic! Urinalysis today  Start on amlodipine, monitor BP at home, call if concerns   PLEASE NOTE:  If you had any LAB tests please let us know if you have not heard back within a few days. You may see your results on MyChart before we have a chance to review them but we will give you a call once they are reviewed by [place]. If we ordered any REFERRALS today, please let us know if you have not heard from their office within the next two weeks. Let us know through MyChart if you are needing REFILLS, or have your pharmacy send [place] the request. You can also use MyChart to communicate with me or any office staff.  Please try these tips to maintain a healthy lifestyle:  Eat most of your calories during the day when you are active. Eliminate processed foods including packaged sweets (pies, cakes, cookies), reduce intake of potatoes, white bread, white pasta, and white rice. Look for whole grain options, oat flour or almond flour.  Each meal should contain half fruits/vegetables, one quarter protein, and one quarter carbs (no bigger than a computer mouse).  Cut down on sweet beverages. This includes juice, soda, and sweet tea. Also watch fruit intake, though this is a healthier sweet option, it still contains natural sugar! Limit to 3 servings daily.  Drink at least 1 glass of water with each meal and aim for at least 8 glasses (64 ounces) per day.  Exercise at least 150 minutes every week to the best of your ability.    Take Care,  Chason Mciver, PA-C

## 2022-09-17 NOTE — Assessment & Plan Note (Signed)
Follows with Dr. Erlinda Hong

## 2022-09-17 NOTE — Assessment & Plan Note (Signed)
Blood pressure is very elevated She is asymptomatic Start on Norvasc 2.5 mg daily She is also taking Lopressor 50 mg 1/2 tablet twice daily We plan to monitor closely

## 2022-09-17 NOTE — Assessment & Plan Note (Signed)
This is controlled with Lopressor 50 mg 1/2 tablet twice daily

## 2022-09-17 NOTE — Assessment & Plan Note (Signed)
Point-of-care A1c rule out diabetes Plan of care urinalysis and culture, treat pending results

## 2022-09-17 NOTE — Assessment & Plan Note (Addendum)
She was previously successful with [facility] weight loss program, but no longer can see them because of insurance issues.  Plan to discuss with her ways to lose weight in the upcoming months.  First thing is to cut back on fast food.

## 2022-09-18 ENCOUNTER — Other Ambulatory Visit: Payer: Self-pay

## 2022-09-18 DIAGNOSIS — G4709 Other insomnia: Secondary | ICD-10-CM

## 2022-09-18 DIAGNOSIS — G43809 Other migraine, not intractable, without status migrainosus: Secondary | ICD-10-CM

## 2022-09-18 MED ORDER — DULOXETINE HCL 30 MG PO CPEP: 30.0000 mg | ORAL_CAPSULE | Freq: Two times a day (BID) | ORAL | 0 refills | Status: DC

## 2022-09-18 MED ORDER — TOPIRAMATE 100 MG PO TABS: ORAL_TABLET | ORAL | 0 refills | Status: DC

## 2022-09-18 MED ORDER — TRAZODONE HCL 100 MG PO TABS: 100.0000 mg | ORAL_TABLET | Freq: Every evening | ORAL | 1 refills | Status: DC | PRN

## 2022-09-18 MED ORDER — CYCLOBENZAPRINE HCL 5 MG PO TABS: ORAL_TABLET | ORAL | 0 refills | Status: DC

## 2022-09-18 NOTE — Telephone Encounter (Signed)
Called pt and lvm with cb number to return call to confirm dosage of Gabapentin for refill purposes

## 2022-09-18 NOTE — Telephone Encounter (Signed)
Sent in all prescriptions except Trazadone and Gabapentin, please send to pharmacy for patient. Thanks

## 2022-09-19 ENCOUNTER — Other Ambulatory Visit: Payer: Self-pay

## 2022-09-19 NOTE — Telephone Encounter (Signed)
Pt returned call and advised she is at work but will take a picture of her bottle when she gets home and send a picture via [place].

## 2022-10-01 ENCOUNTER — Ambulatory Visit: Payer: 59 | Admitting: Nurse Practitioner

## 2022-10-03 ENCOUNTER — Encounter: Payer: Self-pay | Admitting: Physician Assistant

## 2022-10-03 NOTE — Telephone Encounter (Signed)
Ok to place future labs to check TSH ad schedule lab appt?

## 2022-10-04 ENCOUNTER — Other Ambulatory Visit: Payer: Self-pay | Admitting: Physician Assistant

## 2022-10-04 DIAGNOSIS — R002 Palpitations: Secondary | ICD-10-CM

## 2022-10-04 DIAGNOSIS — R5383 Other fatigue: Secondary | ICD-10-CM

## 2022-10-15 ENCOUNTER — Encounter: Payer: 59 | Admitting: Physician Assistant

## 2022-12-17 ENCOUNTER — Telehealth: Payer: Self-pay | Admitting: Physician Assistant

## 2022-12-17 ENCOUNTER — Other Ambulatory Visit: Payer: 59

## 2022-12-17 ENCOUNTER — Ambulatory Visit (INDEPENDENT_AMBULATORY_CARE_PROVIDER_SITE_OTHER): Payer: 59 | Admitting: Physician Assistant

## 2022-12-17 ENCOUNTER — Other Ambulatory Visit: Payer: Self-pay | Admitting: Physician Assistant

## 2022-12-17 ENCOUNTER — Ambulatory Visit (INDEPENDENT_AMBULATORY_CARE_PROVIDER_SITE_OTHER)
Admission: RE | Admit: 2022-12-17 | Discharge: 2022-12-17 | Disposition: A | Discharge status: Home / Self Care | Payer: 59 | Source: Ambulatory Visit | Attending: Physician Assistant | Admitting: Physician Assistant

## 2022-12-17 ENCOUNTER — Other Ambulatory Visit: Payer: Self-pay

## 2022-12-17 ENCOUNTER — Encounter: Payer: Self-pay | Admitting: Physician Assistant

## 2022-12-17 VITALS — BP 150/94 | HR 79 | Temp 97.3°F | Ht 66.0 in | Wt 340.8 lb

## 2022-12-17 DIAGNOSIS — M7989 Other specified soft tissue disorders: Secondary | ICD-10-CM | POA: Insufficient documentation

## 2022-12-17 DIAGNOSIS — G43001 Migraine without aura, not intractable, with status migrainosus: Secondary | ICD-10-CM

## 2022-12-17 DIAGNOSIS — M25519 Pain in unspecified shoulder: Secondary | ICD-10-CM | POA: Insufficient documentation

## 2022-12-17 DIAGNOSIS — G479 Sleep disorder, unspecified: Secondary | ICD-10-CM | POA: Diagnosis not present

## 2022-12-17 DIAGNOSIS — I10 Essential (primary) hypertension: Secondary | ICD-10-CM

## 2022-12-17 DIAGNOSIS — R5383 Other fatigue: Secondary | ICD-10-CM

## 2022-12-17 DIAGNOSIS — M542 Cervicalgia: Secondary | ICD-10-CM

## 2022-12-17 DIAGNOSIS — R002 Palpitations: Secondary | ICD-10-CM

## 2022-12-17 DIAGNOSIS — G4709 Other insomnia: Secondary | ICD-10-CM

## 2022-12-17 MED ORDER — AMLODIPINE BESYLATE 5 MG PO TABS: 5.0000 mg | ORAL_TABLET | Freq: Every day | ORAL | 1 refills | Status: DC

## 2022-12-17 MED ORDER — DULOXETINE HCL 60 MG PO CPEP: 60.0000 mg | ORAL_CAPSULE | Freq: Every day | ORAL | 3 refills | Status: DC

## 2022-12-17 MED ORDER — CYCLOBENZAPRINE HCL 5 MG PO TABS
5.0000 mg | ORAL_TABLET | Freq: Three times a day (TID) | ORAL | 1 refills | Status: DC
Start: 2022-12-17 — End: 2023-04-01

## 2022-12-17 MED ORDER — GABAPENTIN 300 MG PO CAPS: 300.0000 mg | ORAL_CAPSULE | Freq: Three times a day (TID) | ORAL | 0 refills | Status: DC

## 2022-12-17 MED ORDER — TRAZODONE HCL 100 MG PO TABS: 100.0000 mg | ORAL_TABLET | Freq: Every evening | ORAL | 1 refills | Status: DC | PRN

## 2022-12-17 NOTE — Patient Instructions (Addendum)
Good to see you today, Cynthia [name].  I have place a referral to healthy weight and wellness and sleep study; both should call you for an appointment.  Please go to [facility] for lab and XRAY.  Be advised, I will likely be trying for additional CT or MRI imaging of your neck after XRAY.   Referral to PT placed for your neck and shoulder pain.   Increase amlodipine to 5 mg daily. Keep monitoring at home.   Refills sent as requested.  See you back this week as scheduled.

## 2022-12-17 NOTE — Telephone Encounter (Signed)
Returned pharmacy call needed clarification on Rx for Flexeril and advised correct directions pt is to taker TID as needed for pain per provider.

## 2022-12-17 NOTE — Assessment & Plan Note (Signed)
Topamax 100 mg daily, worse recently due to shoulder / neck muscle pain

## 2022-12-17 NOTE — Assessment & Plan Note (Signed)
Tense, tender diffusely; could be 2/2 fibromyalgia; will schedule for PT, may need intense, consistent dry-needling and therapy; she has gabapentin and flexeril to take as directed. She also has diclofenac at home if needed.

## 2022-12-17 NOTE — Assessment & Plan Note (Signed)
BMI currently 55 Referral to [facility] program

## 2022-12-17 NOTE — Assessment & Plan Note (Signed)
Uncontrolled Increase Norvasc to 5 mg daily Continue Lopressor 50 mg 1/2 tab BID Monitory at home Work on wt loss Look for underlying OSA

## 2022-12-17 NOTE — Assessment & Plan Note (Signed)
Plan for sleep study Cont trazodone 100 mg

## 2022-12-17 NOTE — Assessment & Plan Note (Signed)
Plan for XRAY initially today; then likely CT or MRI pending coverage

## 2022-12-17 NOTE — Progress Notes (Signed)
Subjective:    Patient ID: Cynthia [name], female    DOB: 10-Nov-1981, 42 y.o.   MRN: [id number]  Chief Complaint  Patient presents with   Thyroid Problem    Pt c/o swelling and pain in neck and shoulders; causing sleep issues; also c/o chest tightness, like something sitting on her chest; no real SOB per pt, per pt feels like food not digesting or going down; pt wondering if she is getting GERDS  Pt has separate lab appt to do labs to check thyroid, comes back Wednesday for CPE to discuss those. Pt thinks she may have done Pap in 2020. Requesting results.      Patient is in today for several concerns, worse in the last few months. 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   Not sleeping well. Having constant pain in back of neck and upper shoulder. Feels different from fibromyalgia pain; feels like an ache or tension. A one day massage did help, but pain came back. IcyHot, Tens machine, patches, changed pillow & mattress, new bras, Ibuprofen regularly, nothing is helping. Getting more headaches, squeezing pain between forehead and back of head. Pressure pain in esophagus, especially after she eats.   Having to sleep on her back now because of pain; states she normally sleeps on her side. Trazodone 100 mg at bedtime, states she has to wake up every few hours even with this medication.   BP is staying in 150s/90s-100s. Takes her medication regularly.   Very concerned about her weight. States she is walking and eating nutritiously and can't lose any weight, very distraught about her situation.   Past Medical History:  Diagnosis Date   Arthritis    Asthma    childhood   Asthma    Fibromyalgia 2007   Hypertension    on and off since 2007   Seasonal allergies     History reviewed. No pertinent surgical history.  Family History  Problem Relation Age of Onset   Arthritis Mother    Hypertension Mother    Alcohol abuse Father    Arthritis Father     Asthma Father    COPD Father    Hypertension Father    Heart failure Father    Early death Maternal Grandmother    Hypertension Maternal Grandmother    Pneumonia Maternal Grandmother    Hearing loss Maternal Grandfather    Hypertension Maternal Grandfather    Stroke Maternal Grandfather    Hypertension Paternal Grandmother    Heart disease Paternal Grandmother    Hypertension Paternal Grandfather    Heart failure Paternal Grandfather     Social History   Tobacco Use   Smoking status: Never   Smokeless tobacco: Never  Vaping Use   Vaping Use: Never used  Substance Use Topics   Alcohol use: No   Drug use: No     Allergies  Allergen Reactions   Compazine [Prochlorperazine] Itching and Anxiety   Shellfish Allergy Rash    Review of Systems NEGATIVE UNLESS OTHERWISE INDICATED IN HPI      Objective:     BP (!) 150/94 (BP Location: Left Arm)   Pulse 79   Temp (!) 97.3 F (36.3 C) (Temporal)   Ht 5\' 6"  (1.676 m)   Wt (!) 340 lb 12.8 oz (154.6 kg)   LMP 11/26/2022 (Exact Date)   SpO2 95%   BMI 55.01 kg/m   Wt Readings from Last 3 Encounters:  12/17/22 (!) 340 lb  12.8 oz (154.6 kg)  09/17/22 (!) 343 lb 12.8 oz (155.9 kg)  08/23/22 (!) 314 lb (142.4 kg)    BP Readings from Last 3 Encounters:  12/17/22 (!) 150/94  09/17/22 (!) 173/110  06/19/22 (!) 164/99     Physical Exam Constitutional:      Appearance: Normal appearance. She is morbidly obese.  HENT:     Right Ear: External ear normal.     Left Ear: External ear normal.  Neck:      Comments: Muscles very firm, tense, across posterior neck and shoulder muscles; tender to palpation Cardiovascular:     Rate and Rhythm: Normal rate and regular rhythm.     Pulses: Normal pulses.     Heart sounds: Normal heart sounds.  Pulmonary:     Effort: Pulmonary effort is normal.     Breath sounds: Normal breath sounds.  Neurological:     Mental Status: She is alert.  Psychiatric:        Mood and Affect: Mood  normal.        Behavior: Behavior normal.        Assessment & Plan:  Neck and shoulder pain Assessment & Plan: Tense, tender diffusely; could be 2/2 fibromyalgia; will schedule for PT, may need intense, consistent dry-needling and therapy; she has gabapentin and flexeril to take as directed. She also has diclofenac at home if needed.   Orders: -     DG Cervical Spine Complete; Future -     Ambulatory referral to Physical Therapy  Mass of soft tissue of neck Assessment & Plan: Plan for XRAY initially today; then likely CT or MRI pending coverage  Orders: -     DG Cervical Spine Complete; Future  Difficulty sleeping Assessment & Plan: Plan for sleep study Cont trazodone 100 mg   Orders: -     Ambulatory referral to Sleep Studies  Morbid obesity ([place]) Assessment & Plan: BMI currently 55 Referral to [facility] program  Orders: -     Ambulatory referral to Sleep Studies -     Amb Ref to Medical Weight Management  Primary hypertension Assessment & Plan: Uncontrolled Increase Norvasc to 5 mg daily Continue Lopressor 50 mg 1/2 tab BID Monitory at home Work on wt loss Look for underlying OSA  Orders: -     Ambulatory referral to Sleep Studies -     Amb Ref to Medical Weight Management  Other fatigue -     Ambulatory referral to Sleep Studies -     Amb Ref to Medical Weight Management  Other insomnia -     traZODone HCl; Take 1 tablet (100 mg total) by mouth at bedtime as needed. for sleep  Dispense: 90 tablet; Refill: 1  Migraine without aura and with status migrainosus, not intractable Assessment & Plan: Topamax 100 mg daily, worse recently due to shoulder / neck muscle pain    Other orders -     amLODIPine Besylate; Take 1 tablet (5 mg total) by mouth daily.  Dispense: 90 tablet; Refill: 1 -     Cyclobenzaprine HCl; Take 1 tablet (5 mg total) by mouth 3 (three) times daily. 1 tablet at bedtime as needed Orally Once a day  Dispense: 90 tablet; Refill: 1 -      DULoxetine HCl; Take 1 capsule (60 mg total) by mouth daily.  Dispense: 90 capsule; Refill: 3 -     Gabapentin; Take 1 capsule (300 mg total) by mouth 3 (three) times daily.  Dispense: 270  capsule; Refill: 0       Return if symptoms worsen or fail to improve.  This note was prepared with assistance of Conservation officer, historic buildings. Occasional wrong-word or sound-a-like substitutions may have occurred due to the inherent limitations of voice recognition software.   Time Spent: 52 minutes of total time was spent on the date of the encounter performing the following actions: chart review prior to seeing the patient, obtaining history, performing a medically necessary exam, counseling on the treatment plan, placing orders, and documenting in our EHR.      Samuell Knoble M Thorne Wirz, PA-C

## 2022-12-17 NOTE — Telephone Encounter (Signed)
Please call pharmacy back, they have a question on a med refill. Please advise.

## 2022-12-18 ENCOUNTER — Encounter: Payer: Self-pay | Admitting: Physician Assistant

## 2022-12-18 LAB — THYROID PANEL WITH TSH
Free Thyroxine Index: 2.4 (ref 1.4–3.8)
T3 Uptake: 29 % (ref 22–35)
T4, Total: 8.4 ug/dL (ref 5.1–11.9)
TSH: 1.87 mIU/L

## 2022-12-19 ENCOUNTER — Ambulatory Visit (INDEPENDENT_AMBULATORY_CARE_PROVIDER_SITE_OTHER): Payer: 59 | Admitting: Physician Assistant

## 2022-12-19 ENCOUNTER — Encounter: Payer: Self-pay | Admitting: Physician Assistant

## 2022-12-19 VITALS — BP 170/101 | HR 82 | Temp 97.3°F | Ht 66.0 in | Wt 341.4 lb

## 2022-12-19 DIAGNOSIS — I10 Essential (primary) hypertension: Secondary | ICD-10-CM

## 2022-12-19 DIAGNOSIS — Z0000 Encounter for general adult medical examination without abnormal findings: Secondary | ICD-10-CM | POA: Diagnosis not present

## 2022-12-19 DIAGNOSIS — Z124 Encounter for screening for malignant neoplasm of cervix: Secondary | ICD-10-CM

## 2022-12-19 DIAGNOSIS — Z114 Encounter for screening for human immunodeficiency virus [HIV]: Secondary | ICD-10-CM

## 2022-12-19 DIAGNOSIS — R5383 Other fatigue: Secondary | ICD-10-CM

## 2022-12-19 DIAGNOSIS — Z1159 Encounter for screening for other viral diseases: Secondary | ICD-10-CM

## 2022-12-19 LAB — CBC WITH DIFFERENTIAL/PLATELET
Basophils Absolute: 0 10*3/uL (ref 0.0–0.1)
Basophils Relative: 0.7 % (ref 0.0–3.0)
Eosinophils Absolute: 0.1 10*3/uL (ref 0.0–0.7)
Eosinophils Relative: 1.3 % (ref 0.0–5.0)
HCT: 37.7 % (ref 36.0–46.0)
Hemoglobin: 12.3 g/dL (ref 12.0–15.0)
Lymphocytes Relative: 33.7 % (ref 12.0–46.0)
Lymphs Abs: 1.8 10*3/uL (ref 0.7–4.0)
MCHC: 32.8 g/dL (ref 30.0–36.0)
MCV: 85.1 fl (ref 78.0–100.0)
Monocytes Absolute: 0.5 10*3/uL (ref 0.1–1.0)
Monocytes Relative: 9.5 % (ref 3.0–12.0)
Neutro Abs: 2.9 10*3/uL (ref 1.4–7.7)
Neutrophils Relative %: 54.8 % (ref 43.0–77.0)
Platelets: 331 10*3/uL (ref 150.0–400.0)
RBC: 4.43 Mil/uL (ref 3.87–5.11)
RDW: 15.7 % — ABNORMAL HIGH (ref 11.5–15.5)
WBC: 5.3 10*3/uL (ref 4.0–10.5)

## 2022-12-19 LAB — COMPREHENSIVE METABOLIC PANEL
ALT: 10 U/L (ref 0–35)
AST: 12 U/L (ref 0–37)
Albumin: 3.7 g/dL (ref 3.5–5.2)
Alkaline Phosphatase: 78 U/L (ref 39–117)
BUN: 9 mg/dL (ref 6–23)
CO2: 30 mEq/L (ref 19–32)
Calcium: 8.7 mg/dL (ref 8.4–10.5)
Chloride: 100 mEq/L (ref 96–112)
Creatinine, Ser: 0.75 mg/dL (ref 0.40–1.20)
GFR: 97.11 mL/min (ref >60.00)
Glucose, Bld: 95 mg/dL (ref 70–99)
Potassium: 4.2 mEq/L (ref 3.5–5.1)
Sodium: 137 mEq/L (ref 135–145)
Total Bilirubin: 0.5 mg/dL (ref 0.2–1.2)
Total Protein: 7.6 g/dL (ref 6.0–8.3)

## 2022-12-19 LAB — LIPID PANEL
Cholesterol: 128 mg/dL (ref 0–200)
HDL: 52 mg/dL (ref >39.00)
LDL Cholesterol: 69 mg/dL (ref 0–99)
NonHDL: 76.36
Total CHOL/HDL Ratio: 2
Triglycerides: 39 mg/dL (ref 0.0–149.0)
VLDL: 7.8 mg/dL (ref 0.0–40.0)

## 2022-12-19 LAB — VITAMIN B12: Vitamin B-12: 228 pg/mL (ref 211–911)

## 2022-12-19 LAB — VITAMIN D 25 HYDROXY (VIT D DEFICIENCY, FRACTURES): VITD: 12.61 ng/mL — ABNORMAL LOW (ref 30.00–100.00)

## 2022-12-19 LAB — TSH: TSH: 1.4 u[IU]/mL (ref 0.35–5.50)

## 2022-12-19 NOTE — Assessment & Plan Note (Signed)
Uncontrolled blood pressure.  Recent visit 2 days ago in regard to this.  Plan to see how the increase in Norvasc to 5 mg goes.  Plan to recheck blood pressure in about 4 weeks.

## 2022-12-19 NOTE — Progress Notes (Signed)
Subjective:    Patient ID: Cynthia [name], female    DOB: Apr 11, 1981, 42 y.o.   MRN: [id number]  Chief Complaint  Patient presents with   Annual Exam    Pt in the office for annual CPE and fasting labs; pt has no concerns to discuss today. Pt needing referral to GYN for Pap and weight management.     HPI Patient is in today for annual exam.  Health maintenance: Lifestyle/ exercise: limited right now due to neck, walking still  Nutrition: cooks most of her meals  Mental health: OK, but affected by pain and fatigue; very good support system Caffeine: limits caffeine, usually only once or twice a week Sleep: interrupted lately because of neck pain  Substance use: None ETOH: None  Sexual activity: Married, no STI hx or concerns  Immunizations: Will go to pharmacy for COVID-19 booster Colonoscopy: No family hx, will start at age 54  Pap: Needs referral for GYN Mammogram: UTD    Past Medical History:  Diagnosis Date   Allergy 11/20   Arthritis    Asthma    childhood   Asthma    Depression 10/22/21   Fibromyalgia 2007   Hypertension    on and off since 2007   Seasonal allergies     History reviewed. No pertinent surgical history.  Family History  Problem Relation Age of Onset   Arthritis Mother    Hypertension Mother    Obesity Mother    Alcohol abuse Father    Arthritis Father    Asthma Father    COPD Father    Hypertension Father    Heart failure Father    Early death Maternal Grandmother    Hypertension Maternal Grandmother    Pneumonia Maternal Grandmother    Hearing loss Maternal Grandfather    Hypertension Maternal Grandfather    Stroke Maternal Grandfather    Vision loss Maternal Grandfather    Hypertension Paternal Grandmother    Heart disease Paternal Grandmother    Hypertension Paternal Grandfather    Heart failure Paternal Grandfather    Vision loss Paternal Grandfather     Social History   Tobacco Use   Smoking status: Never   Smokeless  tobacco: Never  Vaping Use   Vaping Use: Never used  Substance Use Topics   Alcohol use: No   Drug use: No     Allergies  Allergen Reactions   Compazine [Prochlorperazine] Itching and Anxiety   Shellfish Allergy Rash    Review of Systems NEGATIVE UNLESS OTHERWISE INDICATED IN HPI      Objective:     BP (!) 170/101 (BP Location: Left Arm)   Pulse 82   Temp (!) 97.3 F (36.3 C) (Temporal)   Ht 5\' 6"  (1.676 m)   Wt (!) 341 lb 6.4 oz (154.9 kg)   LMP 11/26/2022 (Exact Date)   SpO2 100%   BMI 55.10 kg/m   Wt Readings from Last 3 Encounters:  12/19/22 (!) 341 lb 6.4 oz (154.9 kg)  12/17/22 (!) 340 lb 12.8 oz (154.6 kg)  09/17/22 (!) 343 lb 12.8 oz (155.9 kg)    BP Readings from Last 3 Encounters:  12/19/22 (!) 170/101  12/17/22 (!) 150/94  09/17/22 (!) 173/110     Physical Exam Vitals and nursing note reviewed.  Constitutional:      Appearance: Normal appearance. She is morbidly obese. She is not toxic-appearing.  HENT:     Head: Normocephalic and atraumatic.     Right Ear: Tympanic  membrane, ear canal and external ear normal.     Left Ear: Tympanic membrane, ear canal and external ear normal.     Nose: Nose normal.     Mouth/Throat:     Mouth: Mucous membranes are moist.  Eyes:     Extraocular Movements: Extraocular movements intact.     Conjunctiva/sclera: Conjunctivae normal.     Pupils: Pupils are equal, round, and reactive to light.  Cardiovascular:     Rate and Rhythm: Normal rate and regular rhythm.     Pulses: Normal pulses.     Heart sounds: Normal heart sounds.  Pulmonary:     Effort: Pulmonary effort is normal.     Breath sounds: Normal breath sounds.  Abdominal:     General: Abdomen is flat. Bowel sounds are normal.     Palpations: Abdomen is soft.  Musculoskeletal:        General: Normal range of motion.     Cervical back: Normal range of motion and neck supple.  Skin:    General: Skin is warm and dry.     Findings: No lesion or rash.   Neurological:     General: No focal deficit present.     Mental Status: She is alert and oriented to person, place, and time.  Psychiatric:        Mood and Affect: Mood normal.        Behavior: Behavior normal.        Thought Content: Thought content normal.        Judgment: Judgment normal.        Assessment & Plan:  Encounter for annual physical exam Assessment & Plan: Age-appropriate screening and counseling performed today. Will check labs and call with results. Preventive measures discussed and printed in AVS for patient.  Referral placed to gynecology to update Pap smear.  Patient Counseling: [x]   Nutrition: Stressed importance of moderation in sodium/caffeine intake, saturated fat and cholesterol, caloric balance, sufficient intake of fresh fruits, vegetables, and fiber.  [x]   Stressed the importance of regular exercise.   []   Substance Abuse: Discussed cessation/primary prevention of tobacco, alcohol, or other drug use; driving or other dangerous activities under the influence; availability of treatment for abuse.   []   Injury prevention: Discussed safety belts, safety helmets, smoke detector, smoking near bedding or upholstery.   []   Sexuality: Discussed sexually transmitted diseases, partner selection, use of condoms, avoidance of unintended pregnancy  and contraceptive alternatives.   [x]   Dental health: Discussed importance of regular tooth brushing, flossing, and dental visits.  [x]   Health maintenance and immunizations reviewed. Please refer to Health maintenance section.      Orders: -     HIV Antibody (routine testing w rflx) -     Hepatitis C antibody -     Iron, TIBC and Ferritin Panel -     VITAMIN D 25 Hydroxy (Vit-D Deficiency, Fractures) -     Vitamin B12 -     TSH -     Comprehensive metabolic panel -     CBC with Differential/Platelet -     Lipid panel  Morbid obesity (HCC) -     Comprehensive metabolic panel -     CBC with Differential/Platelet -      Lipid panel  Primary hypertension Assessment & Plan: Uncontrolled blood pressure.  Recent visit 2 days ago in regard to this.  Plan to see how the increase in Norvasc to 5 mg goes.  Plan to recheck blood pressure  in about 4 weeks.  Orders: -     Comprehensive metabolic panel -     CBC with Differential/Platelet -     Lipid panel  Other fatigue Assessment & Plan: Check blood work today, treat pending abnormal.  Orders: -     Iron, TIBC and Ferritin Panel -     VITAMIN D 25 Hydroxy (Vit-D Deficiency, Fractures) -     Vitamin B12 -     TSH -     Comprehensive metabolic panel -     CBC with Differential/Platelet -     Lipid panel  Encounter for screening for HIV -     HIV Antibody (routine testing w rflx)  Need for hepatitis C screening test -     Hepatitis C antibody  Screening for cervical cancer -     Ambulatory referral to Gynecology        Return in about 4 weeks (around 01/16/2023) for BP recheck .  This note was prepared with assistance of Systems analyst. Occasional wrong-word or sound-a-like substitutions may have occurred due to the inherent limitations of voice recognition software.      Canuto Kingston M Luisangel Wainright, PA-C

## 2022-12-19 NOTE — Assessment & Plan Note (Signed)
Check blood work today, treat pending abnormal.

## 2022-12-19 NOTE — Assessment & Plan Note (Signed)
Age-appropriate screening and counseling performed today. Will check labs and call with results. Preventive measures discussed and printed in AVS for patient.  Referral placed to gynecology to update Pap smear.  Patient Counseling: [x]   Nutrition: Stressed importance of moderation in sodium/caffeine intake, saturated fat and cholesterol, caloric balance, sufficient intake of fresh fruits, vegetables, and fiber.  [x]   Stressed the importance of regular exercise.   []   Substance Abuse: Discussed cessation/primary prevention of tobacco, alcohol, or other drug use; driving or other dangerous activities under the influence; availability of treatment for abuse.   []   Injury prevention: Discussed safety belts, safety helmets, smoke detector, smoking near bedding or upholstery.   []   Sexuality: Discussed sexually transmitted diseases, partner selection, use of condoms, avoidance of unintended pregnancy  and contraceptive alternatives.   [x]   Dental health: Discussed importance of regular tooth brushing, flossing, and dental visits.  [x]   Health maintenance and immunizations reviewed. Please refer to Health maintenance section.

## 2022-12-20 LAB — IRON,TIBC AND FERRITIN PANEL
%SAT: 12 % (calc) — ABNORMAL LOW (ref 16–45)
Ferritin: 5 ng/mL — ABNORMAL LOW (ref 16–232)
Iron: 44 ug/dL (ref 40–190)
TIBC: 372 mcg/dL (calc) (ref 250–450)

## 2022-12-20 LAB — HIV ANTIBODY (ROUTINE TESTING W REFLEX): HIV 1&2 Ab, 4th Generation: NONREACTIVE

## 2022-12-20 LAB — HEPATITIS C ANTIBODY: Hepatitis C Ab: NONREACTIVE

## 2022-12-21 ENCOUNTER — Other Ambulatory Visit: Payer: Self-pay | Admitting: Physician Assistant

## 2022-12-21 MED ORDER — FERROUS SULFATE 325 (65 FE) MG PO TABS: 325.0000 mg | ORAL_TABLET | ORAL | 3 refills | Status: DC

## 2022-12-21 MED ORDER — VITAMIN D (ERGOCALCIFEROL) 1.25 MG (50000 UNIT) PO CAPS: 50000.0000 [IU] | ORAL_CAPSULE | ORAL | 0 refills | Status: DC

## 2022-12-24 ENCOUNTER — Other Ambulatory Visit: Payer: Self-pay

## 2022-12-31 ENCOUNTER — Other Ambulatory Visit: Payer: 59

## 2023-01-04 ENCOUNTER — Telehealth: Payer: 59 | Admitting: Physician Assistant

## 2023-01-04 DIAGNOSIS — J039 Acute tonsillitis, unspecified: Secondary | ICD-10-CM | POA: Diagnosis not present

## 2023-01-04 MED ORDER — AMOXICILLIN 500 MG PO TABS: 500.0000 mg | ORAL_TABLET | Freq: Two times a day (BID) | ORAL | 0 refills | Status: AC

## 2023-01-04 NOTE — Progress Notes (Signed)
I have spent 5 minutes in review of e-visit questionnaire, review and updating patient chart, medical decision making and response to patient.   Sunjai Levandoski Cody Lyric Hoar, PA-C    

## 2023-01-04 NOTE — Progress Notes (Signed)

## 2023-01-14 ENCOUNTER — Ambulatory Visit: Payer: 59 | Admitting: Physician Assistant

## 2023-01-20 ENCOUNTER — Telehealth: Payer: 59 | Admitting: Nurse Practitioner

## 2023-01-20 DIAGNOSIS — J029 Acute pharyngitis, unspecified: Secondary | ICD-10-CM

## 2023-01-20 MED ORDER — LIDOCAINE VISCOUS HCL 2 % MT SOLN: 5.0000 mL | OROMUCOSAL | 0 refills | Status: DC | PRN

## 2023-01-20 NOTE — Progress Notes (Signed)
I have spent 5 minutes in review of e-visit questionnaire, review and updating patient chart, medical decision making and response to patient.  ° °Ralonda Tartt W Vickie Ponds, NP ° °  °

## 2023-01-20 NOTE — Progress Notes (Signed)
E-Visit for Sore Throat - Strep Symptoms  We are sorry that you are not feeling well.  Here is how we plan to help!  Based on what you have shared with me it is likely that you have strep pharyngitis.  Strep pharyngitis is inflammation and infection in the back of the throat.  I have prescribed 2% Viscous Lidocaine 5 ml gargle and swallow every 4 hours as needed for throat pain. If this is not effective after a few days I recommend a face to face visit to rule out any abscess of the tonsils.   For throat pain, we recommend over the counter oral pain relief medications such as acetaminophen or aspirin, or anti-inflammatory medications such as ibuprofen or naproxen sodium. Topical treatments such as oral throat lozenges or sprays may be used as needed. Strep infections are not as easily transmitted as other respiratory infections, however we still recommend that you avoid close contact with loved ones, especially the very young and elderly.  Remember to wash your hands thoroughly throughout the day as this is the number one way to prevent the spread of infection and wipe down door knobs and counters with disinfectant.   Home Care: Only take medications as instructed by your medical team. Complete the entire course of an antibiotic. Do not take these medications with alcohol. A steam or ultrasonic humidifier can help congestion.  You can place a towel over your head and breathe in the steam from hot water coming from a faucet. Avoid close contacts especially the very young and the elderly. Cover your mouth when you cough or sneeze. Always remember to wash your hands.  Get Help Right Away If: You develop worsening fever or sinus pain. You develop a severe head ache or visual changes. Your symptoms persist after you have completed your treatment plan.  Make sure you Understand these instructions. Will watch your condition. Will get help right away if you are not doing well or get  worse.   Thank you for choosing an e-visit.  Your e-visit answers were reviewed by a board certified advanced clinical practitioner to complete your personal care plan. Depending upon the condition, your plan could have included both over the counter or prescription medications.  Please review your pharmacy choice. Make sure the pharmacy is open so you can pick up prescription now. If there is a problem, you may contact your provider through [employer] and have the prescription routed to another pharmacy.  Your safety is important to [place]. If you have drug allergies check your prescription carefully.   For the next 24 hours you can use MyChart to ask questions about today's visit, request a non-urgent call back, or ask for a work or school excuse. You will get an email in the next two days asking about your experience. I hope that your e-visit has been valuable and will speed your recovery.

## 2023-01-25 ENCOUNTER — Ambulatory Visit: Payer: 59 | Admitting: Radiology

## 2023-02-11 DIAGNOSIS — Z0289 Encounter for other administrative examinations: Secondary | ICD-10-CM

## 2023-02-13 ENCOUNTER — Encounter (INDEPENDENT_AMBULATORY_CARE_PROVIDER_SITE_OTHER): Payer: 59 | Admitting: Family Medicine

## 2023-03-12 ENCOUNTER — Encounter (INDEPENDENT_AMBULATORY_CARE_PROVIDER_SITE_OTHER): Payer: Self-pay | Admitting: Family Medicine

## 2023-03-12 ENCOUNTER — Ambulatory Visit (INDEPENDENT_AMBULATORY_CARE_PROVIDER_SITE_OTHER): Payer: 59 | Admitting: Family Medicine

## 2023-03-12 VITALS — BP 158/88 | HR 96 | Temp 98.5°F | Ht 66.0 in | Wt 353.0 lb

## 2023-03-12 DIAGNOSIS — E559 Vitamin D deficiency, unspecified: Secondary | ICD-10-CM | POA: Diagnosis not present

## 2023-03-12 DIAGNOSIS — R7303 Prediabetes: Secondary | ICD-10-CM

## 2023-03-12 DIAGNOSIS — I10 Essential (primary) hypertension: Secondary | ICD-10-CM | POA: Diagnosis not present

## 2023-03-12 DIAGNOSIS — R0602 Shortness of breath: Secondary | ICD-10-CM | POA: Diagnosis not present

## 2023-03-12 DIAGNOSIS — F39 Unspecified mood [affective] disorder: Secondary | ICD-10-CM | POA: Diagnosis not present

## 2023-03-12 DIAGNOSIS — Z6841 Body Mass Index (BMI) 40.0 and over, adult: Secondary | ICD-10-CM

## 2023-03-12 DIAGNOSIS — Z1331 Encounter for screening for depression: Secondary | ICD-10-CM

## 2023-03-12 DIAGNOSIS — E669 Obesity, unspecified: Secondary | ICD-10-CM

## 2023-03-12 DIAGNOSIS — R5383 Other fatigue: Secondary | ICD-10-CM | POA: Diagnosis not present

## 2023-03-12 DIAGNOSIS — Z8669 Personal history of other diseases of the nervous system and sense organs: Secondary | ICD-10-CM
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
## 2023-03-12 NOTE — Progress Notes (Signed)
[name], D.O.  ABFM, ABOM Specializing in Clinical Bariatric Medicine Office located at: [address] [place], [place]  [address]     Initial Bariatric Medicine Consultation Visit  Dear Allwardt, Crist Infante, PA-C   Thank you for referring Nathali Vent to our clinic today for evaluation.  We performed a consultation to discuss her options for treatment and educate the patient on her disease state.  The following note includes my evaluation and treatment recommendations.   Please do not hesitate to reach out to me directly if you have any further concerns.    Assessment and Plan:   Orders Placed This Encounter  Procedures   Vitamin B12   CBC with Differential/Platelet   Comprehensive metabolic panel   Folate   Hemoglobin A1c   Insulin, random   Lipid Panel With LDL/HDL Ratio   VITAMIN D 25 Hydroxy (Vit-D Deficiency, Fractures)   TSH   T4, free   T3   EKG 12-Lead    There are no discontinued medications.   No orders of the defined types were placed in this encounter.    1) Fatigue Assessment: Condition is Not optimized. Freddy does feel that her weight is causing her energy to be lower than it should be. Fatigue may be related to obesity, depression or many other causes. she does not appear to have any red flag symptoms and this appears to most likely related to her current lifestyle habits and dietary intake.  Plan:  Labs will be ordered and reviewed with her at their next office visit in two weeks. Epworth sleepiness scale score appears to be within normal limits.  Her ESS score is 9.   Anira admits to daytime somnolence and admits to waking up still tired. Patient has a history of symptoms of daytime fatigue. Camella generally gets 5 or 6 hours of sleep per night, and states that she has unrestful sleep Snoring is not present. Apneic episodes states that she had stopped breathing in her sleep in the past.  ECG: Normal sinus rhythm, rate 93 bpm;  reassuring without any acute abnormalities, will continue to monitor for symptoms  Modified PHQ-9 Depression Screen: Her Food and Mood (modified PHQ-9) score was 19. In the meanwhile, Allice will focus on self care including making healthy food choices by following their meal plan, improving sleep quality and focusing on stress reduction.  Once we are assured she is on an appropriate meal plan, we will start discussing exercise to increase cardiovascular fitness levels.    2) Shortness of breath on exertion Assessment: Condition is Not optimized. Mckenley does feel that she gets out of breath more easily than she used to when she exercises and seems to be worsening over time with weight gain.  This has gotten worse recently. Jasmine denies shortness of breath at rest or orthopnea. Tayten's shortness of breath appears to be obesity related and exercise induced, as they do not appear to have any "red flag" symptoms/ concerns today.  Also, this condition appears to be related to a state of poor cardiovascular conditioning   Plan:  Obtain labs today and will be reviewed with her at their next office visit in two weeks. Indirect Calorimeter completed today to help guide our dietary regimen. It shows a VO2 of 320 and a REE of 2203.  Her calculated basal metabolic rate is 6045 thus her measured basal metabolic rate is worse than expected. Patient agreed to work on weight loss at this time.  As  Shaquavia progresses through our weight loss program, we will gradually increase exercise as tolerated to treat her current condition.   If Kelda follows our recommendations and loses 5-10% of their weight without improvement of her shortness of breath or if at any time, symptoms become more concerning, they agree to urgently follow up with their PCP/ specialist for further consideration/ evaluation.   Nekita verbalizes agreement with this plan.    Primary hypertension Assessment: Condition is Not optimized., poorly  controlled. Last 3 blood pressure readings in our office are as follows: BP Readings from Last 3 Encounters:  03/12/23 (!) 158/88  12/19/22 (!) 170/101  12/17/22 (!) 150/94  Patient endorses checking her bp everyday. She reports good compliance and tolerance with Norvasc 5 mg daily as recommended by PCP. Patient endorses having more heart palpitations recently. Today she is asymptomatic. No concerns.   Plan: Check labs.  Continue with meds as prescribed by PCP. She reports that she will meet with her PCP in the beginning of May to her high blood pressure.  - Begin with Prudent nutritional plan and low sodium diet, advance exercise as tolerated.   - Ambulatory blood pressure monitoring encouraged.  Reminded patient that if they ever feel poorly in any way, to check their blood pressure and pulse as well. - We will continue to monitor closely alongside PCP/ specialists.  Pt reminded to also f/up with those individuals as instructed by them.  - We will continue to monitor symptoms as they relate to the her weight loss journey.    Prediabetes Assessment: Condition is stable.  Lab Results  Component Value Date   HGBA1C 5.6 09/17/2022   HGBA1C 5.5 01/13/2014  She reports that she has been prediabetic since 2015. She is currently not on any prediabetic medication. This is diet controlled. She endorses that she has hunger and cravings.   Plan: Check labs - Begin to decrease simple carbs/ sugars; increase fiber and proteins -> follow her meal plan.   Emi Lymon will begin to work on weight loss via their meal plan we devised to help decrease the risk of progressing to diabetes.    Mood disorder-Emotional Eating Assessment: Condition is Not optimized..  No SI/HI. Patient reports that she has 2 full time jobs and her jobs are very stressful and laborious. For her mood she is taking Cymbalta  60 mg daily  and Gabapentin 300 mg TID. Denies any side effects. Patient also has a history of  Fibromaylgia, which she was diagnosed with in 2007. Patient endorses that her Cymbalta is also used to treat her Fibromaylgia. She does not have a counselor for the mood disorder. Patient reports eating when stressed, sad, bored, guilty, and to help comfort herself.   Plan: -I recommended patient to meet with a counselor/life coach for stress management.  -Patient was referred to Dr. Dewaine Conger, our Bariatric Psychologist, for her struggles with emotional eating.  -Start her prudent nutritional plan that is low in simple carbohydrates, saturated fats and trans fats to goal of 5-10% weight loss to achieve significant health benefits.     History of migraine Assessment: Condition is stable. Patients migraines are currently controlled. She is compliant with Topamax 100 mg as recommended by PCP. Denies any side effects.   Plan:Continue with med and treatment as recommended by PCP.    Vitamin D deficiency Assessment: Condition is Not optimized. Lab Results  Component Value Date   VD25OH 12.61 (L) 12/19/2022   VD25OH 17 (L) 01/13/2014  Patient has  been more compliant with Ergocalciferol 50K IU weekly. Denies any side effects.   Plan: Check labs Continue with med as prescribed by PCP.   - weight loss will likely improve availability of vitamin D, thus encouraged Quintavia to begin with meal plan and their weight loss efforts to further improve this condition.   TREATMENT PLAN FOR OBESITY:  Assessment: Condition is Not optimized. Fat mass has is 197.6 lbs. Muscle mass is 148.2 lbs.  Total body water is 113.8 lbs.  Plan:  Wrenna is currently in the action stage of change. As such, her goal is to continue weight management plan. Finesse will work on healthier eating habits and begin the Category 3 meal plan with 100 snack calories + Journaling (1600-1700 calories, 160+ grams of protein). I extensively reviewed the Category 3 meal plan with her and answered all her questions.  I emphasized how  journaling increases mindfulness.  Behavioral Intervention Additional resources provided today: category 3 meal plan information Evidence-based interventions for health behavior change were utilized today including the discussion of self monitoring techniques, problem-solving barriers and SMART goal setting techniques.   Regarding patient's less desirable eating habits and patterns, we employed the technique of small changes.  Pt will specifically work on: begin prudent nutritional plan with journaling  for next visit.    Recommended Physical Activity Goals Jeanean has been advised to work up to 150 minutes of moderate intensity aerobic activity a week and strengthening exercises 2-3 times per week for cardiovascular health, weight loss maintenance and preservation of muscle mass.  She has agreed to continue their current level of activity  FOLLOW UP: Follow up in 2 weeks. She was informed of the importance of frequent follow up visits to maximize her success with intensive lifestyle modifications for her multiple health conditions.  Kadelyn Dimascio is aware that we will review all of her lab results at our next visit.  She is aware that if anything is critical/ life threatening with the results, we will be contacting her via MyChart prior to the office visit to discuss management.    Chief Complaint:   OBESITY Amyra Vantuyl (MR# [id number]) is a 42 y.o. female who presents for evaluation and treatment of obesity and related comorbidities. Current BMI is Body mass index is 56.98 kg/m. Eleanor has been struggling with her weight for many years and has been unsuccessful in either losing weight, maintaining weight loss, or reaching her healthy weight goal.  Lavonda Jumbo is currently in the action stage of change and ready to dedicate time achieving and maintaining a healthier weight. Margerite is interested in becoming our patient and working on intensive lifestyle modifications including (but not  limited to) diet and exercise for weight loss.  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 Patient is married to [employer] . She lives with her spouse, 36 yr old mom, and 28 yr old step-daughter. .  Patient desires to be 190lbs in 1-1.5 yrs.   She has tried weight watchers in the past, which worked best for her.  She eats outside the home and fast food/take out 2-3 times per week.   She craves subway cookies, breads, and sweets. She snacks on donuts, bagels, bananas, oatmeal, grapes, and yogurt.   She endorses drinking caloric beverages ( sweet tea with sugar, coffee with creamer + sugar)  She does not like shellfish and liver.   She usually skips breakfast and lunch due  to getting busy with work.  Her worst food habit is eating sweets and forgetting to eat. When she forgets to eat, she eats more snacks in the evening.   Subjective:   This is the patient's first visit at [facility].  The patient's NEW PATIENT PACKET that they filled out prior to today's office visit was reviewed at length and information from that paperwork was included within the following office visit note.    Included in the packet: current and past health history, medications, allergies, ROS, gynecologic history (women only), surgical history, family history, social history, weight history, weight loss surgery history (for those that have had weight loss surgery), nutritional evaluation, mood and food questionnaire along with a depression screening (PHQ9) on all patients, an Epworth questionnaire, sleep habits questionnaire, patient life and health improvement goals questionnaire. These will all be scanned into the patient's chart under the "media" tab.   Review of Systems: Please refer to new patient packet scanned into media. Pertinent positives were addressed with patient today.    Objective:   PHYSICAL EXAM: Blood  pressure (!) 158/88, pulse 96, temperature 98.5 F (36.9 C), height  (1.676 m), weight (!) 353 lb (160.1 kg), SpO2 99 %. Body mass index is 56.98 kg/m. General: Well Developed, well nourished, and in no acute distress.  HEENT: Normocephalic, atraumatic Skin: Warm and dry, cap RF less 2 sec, good turgor Chest:  Normal excursion, shape, no gross abn Respiratory: speaking in full sentences, no conversational dyspnea NeuroM-Sk: Ambulates w/o assistance, moves * 4 Psych: A and O *3, insight good, mood-full  Anthropometric Measurements Height:  (1.676 m) Weight: (!) 353 lb (160.1 kg) BMI (Calculated): 57 Starting Weight: 353 lb Waist Measurement : 56 inches   Body Composition  Body Fat %: 55.9 % Fat Mass (lbs): 197.6 lbs Muscle Mass (lbs): 148.2 lbs Total Body Water (lbs): 113.8 lbs Visceral Fat Rating : 22   Other Clinical Data Fasting: yes Labs: yes Today's Visit #: 1 Starting Date: 03/12/23    DIAGNOSTIC DATA REVIEWED:  BMET    Component Value Date/Time   NA 137 12/19/2022 0936   NA 137 12/08/2018 1624   K 4.2 12/19/2022 0936   CL 100 12/19/2022 0936   CO2 30 12/19/2022 0936   GLUCOSE 95 12/19/2022 0936   BUN 9 12/19/2022 0936   BUN 13 12/08/2018 1624   CREATININE 0.75 12/19/2022 0936   CREATININE 0.72 11/30/2016 0926   CALCIUM 8.7 12/19/2022 0936   GFRNONAA >60 06/19/2022 0535   GFRNONAA >89 11/30/2016 0926   GFRAA 96 12/08/2018 1624   GFRAA >89 11/30/2016 0926   Lab Results  Component Value Date   HGBA1C 5.6 09/17/2022   HGBA1C 5.5 01/13/2014   No results found for: "INSULIN" Lab Results  Component Value Date   TSH 1.40 12/19/2022   CBC    Component Value Date/Time   WBC 5.3 12/19/2022 0936   RBC 4.43 12/19/2022 0936   HGB 12.3 12/19/2022 0936   HGB 13.6 12/08/2018 1624   HCT 37.7 12/19/2022 0936   HCT 41.7 12/08/2018 1624   PLT 331.0 12/19/2022 0936   PLT 321 12/08/2018 1624   MCV 85.1 12/19/2022 0936   MCV 88 12/08/2018 1624    MCH 26.3 06/19/2022 0535   MCHC 32.8 12/19/2022 0936   RDW 15.7 (H) 12/19/2022 0936   RDW 13.3 12/08/2018 1624   Iron Studies    Component Value Date/Time   IRON 44 12/19/2022 0936  TIBC 372 12/19/2022 0936   FERRITIN 5 (L) 12/19/2022 0936   IRONPCTSAT 12 (L) 12/19/2022 0936   Lipid Panel     Component Value Date/Time   CHOL 128 12/19/2022 0936   CHOL 123 12/08/2018 1624   TRIG 39.0 12/19/2022 0936   HDL 52.00 12/19/2022 0936   HDL 39 (L) 12/08/2018 1624   CHOLHDL 2 12/19/2022 0936   VLDL 7.8 12/19/2022 0936   LDLCALC 69 12/19/2022 0936   LDLCALC 69 12/08/2018 1624   Hepatic Function Panel     Component Value Date/Time   PROT 7.6 12/19/2022 0936   ALBUMIN 3.7 12/19/2022 0936   AST 12 12/19/2022 0936   ALT 10 12/19/2022 0936   ALKPHOS 78 12/19/2022 0936   BILITOT 0.5 12/19/2022 0936      Component Value Date/Time   TSH 1.40 12/19/2022 0936   Nutritional Lab Results  Component Value Date   VD25OH 12.61 (L) 12/19/2022   VD25OH 17 (L) 01/13/2014    Attestation Statements:   Reviewed by clinician on day of visit: allergies, medications, problem list, medical history, surgical history, family history, social history, and previous encounter notes.  During the visit, I independently reviewed the patient's EKG, bioimpedance scale results, and indirect calorimeter results. I used this information to tailor a meal plan for the patient that will help Lavonda Jumbo to lose weight and will improve her obesity-related conditions going forward.  I performed a medically necessary appropriate examination and/or evaluation. I discussed the assessment and treatment plan with the patient. The patient was provided an opportunity to ask questions and all were answered. The patient agreed with the plan and demonstrated an understanding of the instructions. Labs were ordered today (unless patient declined them) and will be reviewed with the patient at our next visit unless more critical  results need to be addressed immediately. Clinical information was updated and documented in the EMR.  Time spent on visit including pre-visit chart review and post-visit care was estimated to be 60  minutes.   I,Special Puri,acting as a Neurosurgeon for [employer], DO.,have documented all relevant documentation on the behalf of Thomasene Lot, DO,as directed by  Thomasene Lot, DO while in the presence of Thomasene Lot, DO.   I, Thomasene Lot, DO, have reviewed all documentation for this visit. The documentation on 03/12/23 for the exam, diagnosis, procedures, and orders are all accurate and complete.

## 2023-03-13 LAB — CBC WITH DIFFERENTIAL/PLATELET
Basophils Absolute: 0 10*3/uL (ref 0.0–0.2)
Basos: 1 %
EOS (ABSOLUTE): 0.1 10*3/uL (ref 0.0–0.4)
Eos: 2 %
Hematocrit: 39.1 % (ref 34.0–46.6)
Hemoglobin: 12.7 g/dL (ref 11.1–15.9)
Immature Grans (Abs): 0 10*3/uL (ref 0.0–0.1)
Immature Granulocytes: 0 %
Lymphocytes Absolute: 1.7 10*3/uL (ref 0.7–3.1)
Lymphs: 30 %
MCH: 26.9 pg (ref 26.6–33.0)
MCHC: 32.5 g/dL (ref 31.5–35.7)
MCV: 83 fL (ref 79–97)
Monocytes Absolute: 0.4 10*3/uL (ref 0.1–0.9)
Monocytes: 7 %
Neutrophils Absolute: 3.4 10*3/uL (ref 1.4–7.0)
Neutrophils: 60 %
Platelets: 334 10*3/uL (ref 150–450)
RBC: 4.72 x10E6/uL (ref 3.77–5.28)
RDW: 14.5 % (ref 11.7–15.4)
WBC: 5.6 10*3/uL (ref 3.4–10.8)

## 2023-03-13 LAB — COMPREHENSIVE METABOLIC PANEL
ALT: 12 IU/L (ref 0–32)
AST: 18 IU/L (ref 0–40)
Albumin/Globulin Ratio: 0.9 — ABNORMAL LOW (ref 1.2–2.2)
Albumin: 3.7 g/dL — ABNORMAL LOW (ref 3.9–4.9)
Alkaline Phosphatase: 96 IU/L (ref 44–121)
BUN/Creatinine Ratio: 19 (ref 9–23)
BUN: 14 mg/dL (ref 6–24)
Bilirubin Total: 0.3 mg/dL (ref 0.0–1.2)
CO2: 23 mmol/L (ref 20–29)
Calcium: 8.8 mg/dL (ref 8.7–10.2)
Chloride: 98 mmol/L (ref 96–106)
Creatinine, Ser: 0.75 mg/dL (ref 0.57–1.00)
Globulin, Total: 4.1 g/dL (ref 1.5–4.5)
Glucose: 83 mg/dL (ref 70–99)
Potassium: 4.8 mmol/L (ref 3.5–5.2)
Sodium: 136 mmol/L (ref 134–144)
Total Protein: 7.8 g/dL (ref 6.0–8.5)
eGFR: 101 mL/min/{1.73_m2} (ref >59)

## 2023-03-13 LAB — INSULIN, RANDOM: INSULIN: 12.4 u[IU]/mL (ref 2.6–24.9)

## 2023-03-13 LAB — T4, FREE: Free T4: 1.19 ng/dL (ref 0.82–1.77)

## 2023-03-13 LAB — HEMOGLOBIN A1C
Est. average glucose Bld gHb Est-mCnc: 123 mg/dL
Hgb A1c MFr Bld: 5.9 % — ABNORMAL HIGH (ref 4.8–5.6)

## 2023-03-13 LAB — LIPID PANEL WITH LDL/HDL RATIO
Cholesterol, Total: 123 mg/dL (ref 100–199)
HDL: 50 mg/dL (ref >39)
LDL Chol Calc (NIH): 62 mg/dL (ref 0–99)
LDL/HDL Ratio: 1.2 ratio (ref 0.0–3.2)
Triglycerides: 46 mg/dL (ref 0–149)
VLDL Cholesterol Cal: 11 mg/dL (ref 5–40)

## 2023-03-13 LAB — TSH: TSH: 1.66 u[IU]/mL (ref 0.450–4.500)

## 2023-03-13 LAB — VITAMIN B12: Vitamin B-12: 298 pg/mL (ref 232–1245)

## 2023-03-13 LAB — FOLATE: Folate: 10 ng/mL (ref >3.0)

## 2023-03-13 LAB — T3: T3, Total: 139 ng/dL (ref 71–180)

## 2023-03-13 LAB — VITAMIN D 25 HYDROXY (VIT D DEFICIENCY, FRACTURES): Vit D, 25-Hydroxy: 19.2 ng/mL — ABNORMAL LOW (ref 30.0–100.0)

## 2023-03-25 ENCOUNTER — Telehealth (INDEPENDENT_AMBULATORY_CARE_PROVIDER_SITE_OTHER): Payer: 59 | Admitting: Psychology

## 2023-03-25 ENCOUNTER — Telehealth (INDEPENDENT_AMBULATORY_CARE_PROVIDER_SITE_OTHER): Payer: Self-pay | Admitting: Psychology

## 2023-03-25 NOTE — Telephone Encounter (Signed)
  Office: [phone]  /  Fax: [phone]  Date of Call: Mar 25, 2023  Time of Call: 9:02am Provider: Lawerance Cruel, PsyD  CONTENT: This provider called Cynthia [name] to check-in as she did not present for today's MyChart Video Visit appointment at 9am. A HIPAA compliant voicemail was left requesting a call back. Of note, this provider stayed on the MyChart Video Visit appointment for 5 minutes prior to signing off per the clinic's grace period policy.    PLAN: This provider will wait for Jame to call back. No further follow-up planned by this provider.

## 2023-03-26 ENCOUNTER — Encounter (INDEPENDENT_AMBULATORY_CARE_PROVIDER_SITE_OTHER): Payer: Self-pay | Admitting: Family Medicine

## 2023-03-26 ENCOUNTER — Ambulatory Visit (INDEPENDENT_AMBULATORY_CARE_PROVIDER_SITE_OTHER): Payer: 59 | Admitting: Family Medicine

## 2023-03-26 VITALS — BP 150/96 | HR 56 | Temp 98.1°F | Ht 66.0 in | Wt 345.0 lb

## 2023-03-26 DIAGNOSIS — Z6841 Body Mass Index (BMI) 40.0 and over, adult: Secondary | ICD-10-CM

## 2023-03-26 DIAGNOSIS — D508 Other iron deficiency anemias: Secondary | ICD-10-CM | POA: Insufficient documentation

## 2023-03-26 DIAGNOSIS — E538 Deficiency of other specified B group vitamins: Secondary | ICD-10-CM

## 2023-03-26 DIAGNOSIS — R7303 Prediabetes: Secondary | ICD-10-CM

## 2023-03-26 DIAGNOSIS — E669 Obesity, unspecified: Secondary | ICD-10-CM

## 2023-03-26 DIAGNOSIS — E559 Vitamin D deficiency, unspecified: Secondary | ICD-10-CM

## 2023-03-26 DIAGNOSIS — I10 Essential (primary) hypertension: Secondary | ICD-10-CM | POA: Diagnosis not present

## 2023-03-26 MED ORDER — VITAMIN D (ERGOCALCIFEROL) 1.25 MG (50000 UNIT) PO CAPS: ORAL_CAPSULE | ORAL | 0 refills | Status: DC

## 2023-03-26 MED ORDER — CYANOCOBALAMIN 500 MCG PO TABS: 500.0000 ug | ORAL_TABLET | Freq: Every day | ORAL | Status: DC

## 2023-03-26 NOTE — Progress Notes (Signed)
[name], D.O.  ABFM, ABOM Specializing in Clinical Bariatric Medicine  Office located at: [address] [place], [place]  [address]     Assessment and Plan:    Medications Discontinued During This Encounter  Medication Reason   Vitamin D, Ergocalciferol, (DRISDOL) 1.25 MG (50000 UNIT) CAPS capsule Reorder     Meds ordered this encounter  Medications   cyanocobalamin (VITAMIN B12) 500 MCG tablet    Sig: Take 1 tablet (500 mcg total) by mouth daily.   Vitamin D, Ergocalciferol, (DRISDOL) 1.25 MG (50000 UNIT) CAPS capsule    Sig: 1 po q Wed and 1 po q Sun    Dispense:  8 capsule    Refill:  0    Prediabetes Assessment: Condition is Not optimized.. Labs were reviewed.   Lab Results  Component Value Date   HGBA1C 5.9 (H) 03/12/2023   HGBA1C 5.6 09/17/2022   HGBA1C 5.5 01/13/2014   INSULIN 12.4 03/12/2023   Lab Results  Component Value Date   TSH 1.660 03/12/2023   FREET4 1.19 03/12/2023   Lab Results  Component Value Date   WBC 5.6 03/12/2023   HGB 12.7 03/12/2023   HCT 39.1 03/12/2023   MCV 83 03/12/2023   PLT 334 03/12/2023   Lab Results  Component Value Date   CREATININE 0.75 03/12/2023   BUN 14 03/12/2023   NA 136 03/12/2023   K 4.8 03/12/2023   CL 98 03/12/2023   CO2 23 03/12/2023      Component Value Date/Time   PROT 7.8 03/12/2023 1033   ALBUMIN 3.7 (L) 03/12/2023 1033   AST 18 03/12/2023 1033   ALT 12 03/12/2023 1033   ALKPHOS 96 03/12/2023 1033   BILITOT 0.3 03/12/2023 1033   - She is not on any prediabetic medication. Her prediabetes is controlled through diet.  - She has been off Metformin since September 2023. - Her hunger and cravings are controlled when eating on plan.  - Labs indicate that: - A1c levels have increased from 5.6 to 5.9.  - Her insulin levels are 2 times above the normal value.  - Her thyroid function is normal.  - Her kidney and liver function are normal.   Plan:  - Continue to decrease simple carbs/  sugars; increase fiber and proteins -> follow her meal plan.   - Explained role of simple carbs and insulin levels on hunger and cravings- extensive counseling done - Loice will continue to work on weight loss, exercise, via their meal plan we devised to help decrease the risk of progressing to diabetes.  - We will recheck A1c and fasting insulin level in approximately 3 months from last check, or as deemed appropriate.     Iron deficiency anemia due to dietary causes Assessment: Condition is stable. Labs were reviewed.  Lab Results  Component Value Date   WBC 5.6 03/12/2023   HGB 12.7 03/12/2023   HCT 39.1 03/12/2023   MCV 83 03/12/2023   PLT 334 03/12/2023  - Her CBC is within normal limits now. - She is compliant with Ferrous Sulfate 325 mg every other day.  Denies any side effects.  Plan: - Continue with med as is - Will Continue to monitor condition alongside PCP as it relates to her weight loss journey.  - continue with Fe-rich healthy foods   Vitamin B12 Deficiency Assessment: Condition is new and not optimized. Labs were reviewed.  Lab Results  Component Value Date   [id number] 298 03/12/2023  -  Patient is not taking any B12 supplement currently.  - Labs indicate that her B12 levels are not within the normal limits of 500+.   Plan: - Begin OTC B12 supplement 500 mcg daily.  - Continue their prudent nutritional plan and focus on b12 rich foods such as lean red meats; poultry; eggs; seafood; beans, peas, and lentils; nuts and seeds; and soy products - We will continue to monitor as deemed clinically necessary; in 3 months or so of dietary changes and supplements.   Primary hypertension Assessment: Condition is Improving, but not optimized.. BP was reviewed. Last 3 blood pressure readings in our office are as follows: BP Readings from Last 3 Encounters:  03/26/23 (!) 150/96  03/12/23 (!) 158/88  12/19/22 (!) 170/101  - No issues with Norvasc 5 mg daily as  recommended by PCP.  Denies any side effects. - At home she endorses that her BP typically runs at 155/95 or 163/85.  - Today she is asymptomatic, no concerns.   Plan: - Continue with med as recommended by PCP. - She reports that she has an appointment with her PCP on May 13th to discuss her HTN.  - Continue with Prudent nutritional plan and low sodium diet, advance exercise as tolerated.   -  Ambulatory blood pressure monitoring encouraged.  Reminded patient that if they ever feel poorly in any way, to check their blood pressure and pulse as well. - We will continue to monitor symptoms as they relate to the her weight loss journey.    Vitamin D deficiency Assessment: Condition is Improving, but not optimized.. Labs were reviewed.  Lab Results  Component Value Date   VD25OH 19.2 (L) 03/12/2023   VD25OH 12.61 (L) 12/19/2022   VD25OH 17 (L) 01/13/2014  - Her Vitamin D levels are not within the recommended range of 50-80. - No issues with Ergocalciferol 50K IU weekly. Denies any side effects.  Plan: - Increased Ergocalciferol 50K IU to twice weekly.  - I discussed the importance of vitamin D to the patient's health and well-being as well as to their ability to lose weight.  - I reviewed possible symptoms of low Vitamin D:  low energy, depressed mood, muscle aches, joint aches, osteoporosis etc. with patient - It has been show that administration of vitamin D supplementation leads to improved satiety and a decrease in inflammatory markers.  Hence, low Vitamin D levels may be linked to an increased risk of cardiovascular events and even increased risk of cancers- such as colon and breast. - ideal vitamin D levels reviewed with patient   - weight loss will likely improve availability of vitamin D, thus encouraged Cynthia [name] to continue with meal plan and their weight loss efforts to further improve this condition.     TREATMENT PLAN FOR OBESITY: BMI 50.0-59.9, adult (HCC) Obesity with  starting BMI of 57.1 Assessment: Condition is improving, but not optimized. Biometric data collected today, was reviewed with patient.  Fat mass has decreased by 15.6 lb. Muscle mass has increased by 7 lb. Total body water has increased by 0.2 lb.  Lab Results  Component Value Date   CHOL 123 03/12/2023   HDL 50 03/12/2023   LDLCALC 62 03/12/2023   TRIG 46 03/12/2023   CHOLHDL 2 12/19/2022  - Labs indicate that her HDL, Triglycerides, and LDL levels are all within normal limits.   Plan:  Tersa is currently in the action stage of change. As such, her goal is to continue weight management plan. Taneika will  work on healthier eating habits and continue with keeping a food journal and adhering to recommended goals of 1600-1700 calories and 160+ protein and Category 2 meal plan with 100 snack calories. - I stressed the importance that patient continue with our prudent nutritional plan that is low in saturated and trans fats, and low in fatty carbs to improve these numbers.    Behavioral Intervention Additional resources provided today:  Insulin Resistance and Prediabetes Handout Evidence-based interventions for health behavior change were utilized today including the discussion of self monitoring techniques, problem-solving barriers and SMART goal setting techniques.   Regarding patient's less desirable eating habits and patterns, we employed the technique of small changes.  Pt will specifically work on: continue adherence to prudent nutritional plan and journaling for next visit.    Recommended Physical Activity Goals Laurabelle has been advised to work up to 150 minutes of moderate intensity aerobic activity a week and strengthening exercises 2-3 times per week for cardiovascular health, weight loss maintenance and preservation of muscle mass.  She has agreed to Continue current level of physical activity    FOLLOW UP: No follow-ups on file. She was informed of the importance of frequent  follow up visits to maximize her success with intensive lifestyle modifications for her multiple health conditions.  Subjective:   Chief complaint: Obesity Jaynelle is here to discuss her progress with her obesity treatment plan. She is on the Category 3 Plan with 100 snack calories and keeping a food journal and adhering to recommended goals of 1600-1700 calories and 160+ protein and states she is following her eating plan approximately 95 % of the time. She states she is constantly walking at work.   Interval History:  Dann Magistro is here today for her first follow-up office visit since starting the program with [place].  Since last office visit she: - Mostly followed the meal plan and journaled her intake. From her journaling, she learned that someday's she was under or over in calories.  - Most days she was eating 120-160 grams of protein.  - She did not like drinking the Red Fair-Life Skim Milk  - Her hunger and cravings are well controlled.  - She typically drinks 84-90 ounces of water daily.   All blood work/ lab tests that were recently ordered by myself or an outside provider were reviewed with patient today per their request. Extended time was spent counseling her on all new disease processes that were discovered or preexisting ones that are affected by BMI.  she understands that many of these abnormalities will need to monitored regularly along with the current treatment plan of prudent dietary changes, in which we are making each and every office visit, to improve these health parameters.  Review of Systems:  Pertinent positives were addressed with patient today.  Weight Summary and Biometrics   Weight Lost Since Last Visit: 8 lb  Weight Gained Since Last Visit: 0   Vitals Temp: 98.1 F (36.7 C) BP: (!) 150/96 Pulse Rate: (!) 56 SpO2: 99 %   Anthropometric Measurements Height: 5\' 6"  (1.676 m) Weight: (!) 345 lb (156.5 kg) BMI (Calculated): 55.71 Weight at Last Visit:  353 lb Weight Lost Since Last Visit: 8 lb Weight Gained Since Last Visit: 0 Starting Weight: 353 lb Total Weight Loss (lbs): 8 lb (3.629 kg) Peak Weight: 362 lb   Body Composition  Body Fat %: 52.7 % Fat Mass (lbs): 182 lbs Muscle Mass (lbs): 155.2 lbs Total Body Water (lbs): 114 lbs Visceral  Fat Rating : 21   Other Clinical Data Fasting: No Labs: No Today's Visit #: 2 Starting Date: 03/12/23    Objective:   PHYSICAL EXAM:  Blood pressure (!) 150/96, pulse (!) 56, temperature 98.1 F (36.7 C), height 5\' 6"  (1.676 m), weight (!) 345 lb (156.5 kg), last menstrual period 02/21/2023, SpO2 99 %. Body mass index is 55.68 kg/m.  General: Well Developed, well nourished, and in no acute distress.  HEENT: Normocephalic, atraumatic Skin: Warm and dry, cap RF less 2 sec, good turgor Chest:  Normal excursion, shape, no gross abn Respiratory: speaking in full sentences, no conversational dyspnea NeuroM-Sk: Ambulates w/o assistance, moves * 4 Psych: A and O *3, insight good, mood-full  DIAGNOSTIC DATA REVIEWED:  BMET    Component Value Date/Time   NA 136 03/12/2023 1033   K 4.8 03/12/2023 1033   CL 98 03/12/2023 1033   CO2 23 03/12/2023 1033   GLUCOSE 83 03/12/2023 1033   GLUCOSE 95 12/19/2022 0936   BUN 14 03/12/2023 1033   CREATININE 0.75 03/12/2023 1033   CREATININE 0.72 11/30/2016 0926   CALCIUM 8.8 03/12/2023 1033   GFRNONAA >60 06/19/2022 0535   GFRNONAA >89 11/30/2016 0926   GFRAA 96 12/08/2018 1624   GFRAA >89 11/30/2016 0926   Lab Results  Component Value Date   HGBA1C 5.9 (H) 03/12/2023   HGBA1C 5.5 01/13/2014   Lab Results  Component Value Date   INSULIN 12.4 03/12/2023   Lab Results  Component Value Date   TSH 1.660 03/12/2023   CBC    Component Value Date/Time   WBC 5.6 03/12/2023 1033   WBC 5.3 12/19/2022 0936   RBC 4.72 03/12/2023 1033   RBC 4.43 12/19/2022 0936   HGB 12.7 03/12/2023 1033   HCT 39.1 03/12/2023 1033   PLT 334  03/12/2023 1033   MCV 83 03/12/2023 1033   MCH 26.9 03/12/2023 1033   MCH 26.3 06/19/2022 0535   MCHC 32.5 03/12/2023 1033   MCHC 32.8 12/19/2022 0936   RDW 14.5 03/12/2023 1033   Iron Studies    Component Value Date/Time   IRON 44 12/19/2022 0936   TIBC 372 12/19/2022 0936   FERRITIN 5 (L) 12/19/2022 0936   IRONPCTSAT 12 (L) 12/19/2022 0936   Lipid Panel     Component Value Date/Time   CHOL 123 03/12/2023 1033   TRIG 46 03/12/2023 1033   HDL 50 03/12/2023 1033   CHOLHDL 2 12/19/2022 0936   VLDL 7.8 12/19/2022 0936   LDLCALC 62 03/12/2023 1033   Hepatic Function Panel     Component Value Date/Time   PROT 7.8 03/12/2023 1033   ALBUMIN 3.7 (L) 03/12/2023 1033   AST 18 03/12/2023 1033   ALT 12 03/12/2023 1033   ALKPHOS 96 03/12/2023 1033   BILITOT 0.3 03/12/2023 1033      Component Value Date/Time   TSH 1.660 03/12/2023 1033   Nutritional Lab Results  Component Value Date   VD25OH 19.2 (L) 03/12/2023   VD25OH 12.61 (L) 12/19/2022   VD25OH 17 (L) 01/13/2014    Attestations:   Reviewed by clinician on day of visit: allergies, medications, problem list, medical history, surgical history, family history, social history, and previous encounter notes.   Patient was in the office today and time spent on visit including pre-visit chart review and post-visit care/coordination of care and electronic medical record documentation was 50 minutes. 50% of the time was in face to face counseling of this patient's medical condition(s) and providing  education on treatment options to include the first-line treatment of diet and lifestyle modification.  I,Special Puri,acting as a Neurosurgeon for [employer], DO.,have documented all relevant documentation on the behalf of Thomasene Lot, DO,as directed by  Thomasene Lot, DO while in the presence of Thomasene Lot, DO.   I, Thomasene Lot, DO, have reviewed all documentation for this visit. The documentation on 03/26/23 for the  exam, diagnosis, procedures, and orders are all accurate and complete.

## 2023-04-01 ENCOUNTER — Ambulatory Visit (INDEPENDENT_AMBULATORY_CARE_PROVIDER_SITE_OTHER): Payer: 59 | Admitting: Physician Assistant

## 2023-04-01 VITALS — BP 138/84 | HR 90 | Temp 97.1°F | Ht 66.0 in | Wt 347.4 lb

## 2023-04-01 DIAGNOSIS — G4709 Other insomnia: Secondary | ICD-10-CM | POA: Diagnosis not present

## 2023-04-01 DIAGNOSIS — G43809 Other migraine, not intractable, without status migrainosus: Secondary | ICD-10-CM | POA: Diagnosis not present

## 2023-04-01 DIAGNOSIS — R002 Palpitations: Secondary | ICD-10-CM

## 2023-04-01 DIAGNOSIS — I10 Essential (primary) hypertension: Secondary | ICD-10-CM

## 2023-04-01 DIAGNOSIS — D508 Other iron deficiency anemias: Secondary | ICD-10-CM

## 2023-04-01 DIAGNOSIS — K5900 Constipation, unspecified: Secondary | ICD-10-CM

## 2023-04-01 DIAGNOSIS — Z6841 Body Mass Index (BMI) 40.0 and over, adult: Secondary | ICD-10-CM

## 2023-04-01 DIAGNOSIS — M797 Fibromyalgia: Secondary | ICD-10-CM

## 2023-04-01 MED ORDER — DICLOFENAC SODIUM 75 MG PO TBEC: 75.0000 mg | DELAYED_RELEASE_TABLET | Freq: Two times a day (BID) | ORAL | 2 refills | Status: DC | PRN

## 2023-04-01 MED ORDER — FERROUS SULFATE 325 (65 FE) MG PO TABS: 325.0000 mg | ORAL_TABLET | ORAL | 3 refills | Status: DC

## 2023-04-01 MED ORDER — DULOXETINE HCL 60 MG PO CPEP: 60.0000 mg | ORAL_CAPSULE | Freq: Every day | ORAL | 3 refills | Status: DC

## 2023-04-01 MED ORDER — GABAPENTIN 300 MG PO CAPS: 300.0000 mg | ORAL_CAPSULE | Freq: Three times a day (TID) | ORAL | 0 refills | Status: DC

## 2023-04-01 MED ORDER — CYCLOBENZAPRINE HCL 5 MG PO TABS: 5.0000 mg | ORAL_TABLET | Freq: Three times a day (TID) | ORAL | 1 refills | Status: DC

## 2023-04-01 MED ORDER — AMLODIPINE BESYLATE 5 MG PO TABS: 7.5000 mg | ORAL_TABLET | Freq: Every day | ORAL | 1 refills | Status: DC

## 2023-04-01 MED ORDER — METOPROLOL SUCCINATE ER 25 MG PO TB24: 25.0000 mg | ORAL_TABLET | Freq: Every day | ORAL | 2 refills | Status: DC

## 2023-04-01 MED ORDER — TRAZODONE HCL 100 MG PO TABS: 100.0000 mg | ORAL_TABLET | Freq: Every evening | ORAL | 1 refills | Status: DC | PRN

## 2023-04-01 MED ORDER — TOPIRAMATE 100 MG PO TABS: ORAL_TABLET | ORAL | 0 refills | Status: DC

## 2023-04-01 MED ORDER — LINACLOTIDE 72 MCG PO CAPS: 72.0000 ug | ORAL_CAPSULE | Freq: Every day | ORAL | 0 refills | Status: DC

## 2023-04-01 NOTE — Assessment & Plan Note (Signed)
Working with [employer] at this time, very motivated

## 2023-04-01 NOTE — Assessment & Plan Note (Signed)
Elevated readings recently; increase amlodipine to 7.5 mg daily; add Toprol XL 25 mg daily; track at home

## 2023-04-01 NOTE — Assessment & Plan Note (Signed)
Stable, refilled.

## 2023-04-01 NOTE — Assessment & Plan Note (Signed)
Did well with Linzess previously per patient, will start back on 72 mcg daily. Cont water, fiber daily.

## 2023-04-01 NOTE — Progress Notes (Signed)
Subjective:    Patient ID: Cynthia [name], female    DOB: Nov 16, 1981, 42 y.o.   MRN: [id number]  Chief Complaint  Patient presents with   Hypertension    Pt in office for BP recheck and discuss medication maybe being increased due to bp being running higher; also discuss prescription ibuprofen     Hypertension   Patient is in today for recheck on chronic conditions, med refills. See A/P.   Going through [facility] - weight loss and meal plan; strict, but pt states this helping a lot. Getting iron, B12, and Vit D treated currently. Increased water intake. Going into third week of dietary changes.   Still looking to get sleep study done. Difficulty getting some PT done because of schedule conflicts.   Blood pressure still trending high. 154/90s on average at home. Also noticing more irregular heart beats recently.   Tramadol not helping fibromyalgia pain. States flexeril and ibuprofen combo seems to be working better.     Past Medical History:  Diagnosis Date   Allergy 11/20   Anxiety    Arthritis    Asthma    childhood   Asthma    Bilateral swelling of feet    Constipation    Depression 10/22/21   Fatigue    Fibromyalgia 2007   High blood pressure    Hypertension    on and off since 2007   Joint pain    Multiple food allergies    Osteoarthritis    Osteoarthritis    Palpitations    Prediabetes    Seasonal allergies    SOB (shortness of breath)    Vitamin D deficiency     No past surgical history on file.  Family History  Problem Relation Age of Onset   Arthritis Mother    Hypertension Mother    Obesity Mother    Heart disease Father    Hyperlipidemia Father    Alcohol abuse Father    Arthritis Father    Asthma Father    COPD Father    Hypertension Father    Heart failure Father    Liver disease Father    Alcoholism Father    Early death Maternal Grandmother    Hypertension Maternal Grandmother    Pneumonia Maternal Grandmother     Hearing loss Maternal Grandfather    Hypertension Maternal Grandfather    Stroke Maternal Grandfather    Vision loss Maternal Grandfather    Hypertension Paternal Grandmother    Heart disease Paternal Grandmother    Hypertension Paternal Grandfather    Heart failure Paternal Grandfather    Vision loss Paternal Grandfather     Social History   Tobacco Use   Smoking status: Never   Smokeless tobacco: Never  Vaping Use   Vaping Use: Never used  Substance Use Topics   Alcohol use: No   Drug use: No     Allergies  Allergen Reactions   Compazine [Prochlorperazine] Itching and Anxiety   Justicia Adhatoda (Malabar Nut Tree) [Justicia Adhatoda]     Tree nuts   Shellfish Allergy Rash    Review of Systems NEGATIVE UNLESS OTHERWISE INDICATED IN HPI      Objective:     BP 138/84 (BP Location: Left Arm)   Pulse 90   Temp (!) 97.1 F (36.2 C) (Temporal)   Ht 5\' 6"  (1.676 m)   Wt (!) 347 lb 6.4 oz (157.6 kg)   LMP 03/22/2023 (Exact Date)   SpO2 100%  BMI 56.07 kg/m   Wt Readings from Last 3 Encounters:  04/01/23 (!) 347 lb 6.4 oz (157.6 kg)  03/26/23 (!) 345 lb (156.5 kg)  03/12/23 (!) 353 lb (160.1 kg)    BP Readings from Last 3 Encounters:  04/01/23 138/84  03/26/23 (!) 150/96  03/12/23 (!) 158/88     Physical Exam Constitutional:      Appearance: Normal appearance. She is morbidly obese.  HENT:     Right Ear: External ear normal.     Left Ear: External ear normal.  Cardiovascular:     Rate and Rhythm: Normal rate and regular rhythm.     Pulses: Normal pulses.     Heart sounds: Normal heart sounds.  Pulmonary:     Effort: Pulmonary effort is normal.     Breath sounds: Normal breath sounds.  Neurological:     Mental Status: She is alert.  Psychiatric:        Mood and Affect: Mood normal.        Behavior: Behavior normal.        Assessment & Plan:  Primary hypertension Assessment & Plan: Elevated readings recently; increase amlodipine to 7.5  mg daily; add Toprol XL 25 mg daily; track at home  Orders: -     Metoprolol Succinate ER; Take 1 tablet (25 mg total) by mouth daily.  Dispense: 30 tablet; Refill: 2 -     amLODIPine Besylate; Take 1.5 tablets (7.5 mg total) by mouth daily.  Dispense: 135 tablet; Refill: 1  Other migraine without status migrainosus, not intractable Assessment & Plan: Stable, refilled  Orders: -     Topiramate; TAKE 1 AND 1/2 TABLETS BY  MOUTH 2 TIMES DAILY. FOR  MIGRAINE PREVENTION  Dispense: 270 tablet; Refill: 0  Other insomnia Assessment & Plan: Stable on Trazodone 100 mg qhs, refilled  Orders: -     traZODone HCl; Take 1 tablet (100 mg total) by mouth at bedtime as needed. for sleep  Dispense: 90 tablet; Refill: 1  Morbid obesity (HCC) Assessment & Plan: Working with [employer] at this time, very motivated   Iron deficiency anemia due to dietary causes Assessment & Plan: Lab Results  Component Value Date   WBC 5.6 03/12/2023   HGB 12.7 03/12/2023   HCT 39.1 03/12/2023   MCV 83 03/12/2023   PLT 334 03/12/2023   Stable, refilled  Orders: -     Ferrous Sulfate; Take 1 tablet (325 mg total) by mouth every other day.  Dispense: 30 tablet; Refill: 3  Constipation, unspecified constipation type Assessment & Plan: Did well with Linzess previously per patient, will start back on 72 mcg daily. Cont water, fiber daily.  Orders: -     linaCLOtide; Take 1 capsule (72 mcg total) by mouth daily before breakfast.  Dispense: 90 capsule; Refill: 0  Palpitations Assessment & Plan: This has been recurrent for patient over the years, more recently, but says less caffeine in diet. Will start on Toprol XL 25 mg, should help with BP as well. Monitor, reach out if worse.  Orders: -     Metoprolol Succinate ER; Take 1 tablet (25 mg total) by mouth daily.  Dispense: 30 tablet; Refill: 2  Fibromyalgia Assessment & Plan: Stable with current medication regimen, refilled today. Still trying to get PT  scheduled.   Orders: -     Cyclobenzaprine HCl; Take 1 tablet (5 mg total) by mouth 3 (three) times daily. 1 tablet at bedtime as needed Orally Once a day  Dispense: 90 tablet; Refill: 1 -     DULoxetine HCl; Take 1 capsule (60 mg total) by mouth daily.  Dispense: 90 capsule; Refill: 3 -     Gabapentin; Take 1 capsule (300 mg total) by mouth 3 (three) times daily.  Dispense: 270 capsule; Refill: 0 -     Diclofenac Sodium; Take 1 tablet (75 mg total) by mouth 2 (two) times daily as needed.  Dispense: 60 tablet; Refill: 2        Return in about 3 months (around 07/02/2023) for well woman.    Temprance Wyre M Keithon Mccoin, PA-C

## 2023-04-01 NOTE — Assessment & Plan Note (Signed)
Stable on Trazodone 100 mg qhs, refilled

## 2023-04-01 NOTE — Assessment & Plan Note (Signed)
Lab Results  Component Value Date   WBC 5.6 03/12/2023   HGB 12.7 03/12/2023   HCT 39.1 03/12/2023   MCV 83 03/12/2023   PLT 334 03/12/2023   Stable, refilled

## 2023-04-01 NOTE — Assessment & Plan Note (Signed)
Stable with current medication regimen, refilled today. Still trying to get PT scheduled.

## 2023-04-01 NOTE — Assessment & Plan Note (Signed)
This has been recurrent for patient over the years, more recently, but says less caffeine in diet. Will start on Toprol XL 25 mg, should help with BP as well. Monitor, reach out if worse.

## 2023-04-03 ENCOUNTER — Ambulatory Visit: Payer: 59 | Admitting: Orthopaedic Surgery

## 2023-04-09 ENCOUNTER — Ambulatory Visit (INDEPENDENT_AMBULATORY_CARE_PROVIDER_SITE_OTHER): Payer: 59 | Admitting: Family Medicine

## 2023-04-09 ENCOUNTER — Encounter (INDEPENDENT_AMBULATORY_CARE_PROVIDER_SITE_OTHER): Payer: Self-pay | Admitting: Family Medicine

## 2023-04-09 ENCOUNTER — Telehealth (INDEPENDENT_AMBULATORY_CARE_PROVIDER_SITE_OTHER): Payer: 59 | Admitting: Psychology

## 2023-04-09 VITALS — BP 148/80 | HR 75 | Temp 98.3°F | Ht 66.0 in | Wt 341.0 lb

## 2023-04-09 DIAGNOSIS — R7303 Prediabetes: Secondary | ICD-10-CM

## 2023-04-09 DIAGNOSIS — E538 Deficiency of other specified B group vitamins: Secondary | ICD-10-CM | POA: Diagnosis not present

## 2023-04-09 DIAGNOSIS — F5089 Other specified eating disorder: Secondary | ICD-10-CM | POA: Diagnosis not present

## 2023-04-09 DIAGNOSIS — E559 Vitamin D deficiency, unspecified: Secondary | ICD-10-CM | POA: Diagnosis not present

## 2023-04-09 DIAGNOSIS — I10 Essential (primary) hypertension: Secondary | ICD-10-CM

## 2023-04-09 DIAGNOSIS — Z6841 Body Mass Index (BMI) 40.0 and over, adult: Secondary | ICD-10-CM

## 2023-04-09 MED ORDER — VITAMIN D (ERGOCALCIFEROL) 1.25 MG (50000 UNIT) PO CAPS: ORAL_CAPSULE | ORAL | 0 refills | Status: DC
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
## 2023-04-09 NOTE — Progress Notes (Signed)
[name], D.O.  ABFM, ABOM Specializing in Clinical Bariatric Medicine  Office located at: [address] [place], [place]  [address]     Assessment and Plan:   Medications Discontinued During This Encounter  Medication Reason   Vitamin D, Ergocalciferol, (DRISDOL) 1.25 MG (50000 UNIT) CAPS capsule Reorder     Meds ordered this encounter  Medications   Vitamin D, Ergocalciferol, (DRISDOL) 1.25 MG (50000 UNIT) CAPS capsule    Sig: 1 po q Wed and 1 po q Sun    Dispense:  8 capsule    Refill:  0     Primary hypertension Assessment: Condition is not optimized and slightly improved since last visit with [place].   Last 3 blood pressure readings in our office are as follows: BP Readings from Last 3 Encounters:  04/09/23 (!) 148/80  04/01/23 138/84  03/26/23 (!) 150/96  - Patient endorses that her PCP recently increased her Norvasc to 7.5 mg daily and added Toprol-XL 25 mg back to her hypertension management.  - Today she is asymptomatic, no concerns.   Plan: - Continue with all antihypertensive medications as prescribed by PCP. - Avoid buying foods that are: processed, frozen, or prepackaged to avoid excess salt. - Ambulatory blood pressure monitoring encouraged.  Reminded patient that if they ever feel poorly in any way, to check their blood pressure and pulse as well. - We will continue to monitor closely alongside PCP/ specialists.  Pt reminded to also f/up with those individuals as instructed by them.  - We will continue to monitor symptoms as they relate to the her weight loss journey.    Prediabetes Assessment: Condition is not optimized. Lab Results  Component Value Date   HGBA1C 5.9 (H) 03/12/2023   HGBA1C 5.6 09/17/2022   HGBA1C 5.5 01/13/2014   INSULIN 12.4 03/12/2023  - Pt is not on any prediabetic medication. This is diet controlled.  - Endorses having hunger after dinner and after waking up, which is likely due to not having enough lean proteins.    Plan: - Continue to decrease simple carbs/ sugars; increase fiber and proteins -> follow her meal plan.   Fantazia Clure will continue to work on weight loss, exercise, via their meal plan we devised to help decrease the risk of progressing to diabetes.    Vitamin D deficiency Assessment: Condition is not optimized. Lab Results  Component Value Date   VD25OH 19.2 (L) 03/12/2023   VD25OH 12.61 (L) 12/19/2022   VD25OH 17 (L) 01/13/2014  - No issues with Ergocalciferol 50K IU twice weekly. Denies any side effects.   Plan: - Continue with med. Will reorder this today.   - Will continue to monitor levels regularly (every 3-4 mo on average) to keep levels within normal limits and prevent over supplementation.   Vitamin B12 deficiency Assessment: Condition is not optimized. Lab Results  Component Value Date   VITAMINB12 298 03/12/2023  - Patient is complaint and tolerant with OTC Vitamin B12 500 mcg daily. Denies any side effects.  Plan: -  Continue with med.  - Continue her prudent nutritional plan that is low in simple carbohydrates, saturated fats and trans fats to goal of 5-10% weight loss to achieve significant health benefits.  Pt encouraged to continually advance exercise and cardiovascular fitness as tolerated throughout weight loss journey.    TREATMENT PLAN FOR OBESITY: BMI 50.0-59.9, adult (HCC) Morbid obesity (HCC) Assessment:  Esmarie Padrick is here to discuss her progress with her obesity treatment  plan along with follow-up of her obesity related diagnoses. See Medical Weight Management Flowsheet for complete bioelectrical impedance results.  Condition is not optimized. Biometric data collected today, was reviewed with patient.   Since last office visit on 03/26/23 patient's Fat mass has increased by 5.4 lb. Muscle mass has decreased by 8.6 lb. Total body water has increased by 0.6 lb.  Counseling done on how various foods will affect these numbers and how to maximize  success  Total lbs lost to date: 12  Total weight loss percentage to date: 3.40   Plan:  - Arianda will work on healthier eating habits and  continue with keeping a food journal and adhering to recommended goals of 1600-1700 calories and 160+ protein and Category 3 meal plan with 100 snack calories.   - I again reviewed the Category 3 meal plan with the pt to show her how to obtain her daily protein goals.  - I emphasized the importance of making oneself a priority in order to lose weight.   Behavioral Intervention Additional resources provided today: patient declined Evidence-based interventions for health behavior change were utilized today including the discussion of self monitoring techniques, problem-solving barriers and SMART goal setting techniques.   Regarding patient's less desirable eating habits and patterns, we employed the technique of small changes.  Pt will specifically work on: continue journaling her intake of calories and protein for next visit.    Recommended Physical Activity Goals  Laurieann has been advised to slowly work up to 150 minutes of moderate intensity aerobic activity a week and strengthening exercises 2-3 times per week for cardiovascular health, weight loss maintenance and preservation of muscle mass.   She has agreed to Continue current level of physical activity   FOLLOW UP: Return in about 2 weeks (around 04/23/2023). She was informed of the importance of frequent follow up visits to maximize her success with intensive lifestyle modifications for her multiple health conditions.   Subjective:   Chief complaint: Obesity Marisah is here to discuss her progress with her obesity treatment plan. She is on the keeping a food journal and adhering to recommended goals of 1600-1700 calories and 160+ protein and the Category 3 meal plan with 100 snack calories and states she is following her eating plan approximately 95% of the time. She states she is walking 20  minutes 4 days per week.  Interval History:  Arieli Minnig is here for a follow up office visit.     Since last office visit:   - Reports eating all the food on plan, however endorses feeling hungry after dinner and after waking up.  - For dinner yesterday, she had a [place] Steak with cauliflower rice and a cup of greens.  - For her snack calories she eats yogurt.  - From journaling, she learned that she was under her protein goals on several occasions.   Review of Systems:  Pertinent positives were addressed with patient today.  Weight Summary and Biometrics   Weight Lost Since Last Visit: 4lb  No data recorded   Vitals Temp: 98.3 F (36.8 C) BP: (!) 148/80 Pulse Rate: 75 SpO2: 98 %   Anthropometric Measurements Height: 5\' 6"  (1.676 m) Weight: (!) 341 lb (154.7 kg) BMI (Calculated): 55.07 Weight at Last Visit: 345lb Weight Lost Since Last Visit: 4lb Starting Weight: 353lb Total Weight Loss (lbs): 12 lb (5.443 kg)   Body Composition  Body Fat %: 54.8 % Fat Mass (lbs): 187.4 lbs Muscle Mass (lbs): 146.6  lbs Total Body Water (lbs): 114.6 lbs Visceral Fat Rating : 21   Other Clinical Data Fasting: no Labs: no Today's Visit #: 3 Starting Date: 03/12/23   Objective:   PHYSICAL EXAM: Blood pressure (!) 148/80, pulse 75, temperature 98.3 F (36.8 C), height 5\' 6"  (1.676 m), weight (!) 341 lb (154.7 kg), last menstrual period 03/22/2023, SpO2 98 %. Body mass index is 55.04 kg/m.  General: Well Developed, well nourished, and in no acute distress.  HEENT: Normocephalic, atraumatic Skin: Warm and dry, cap RF less 2 sec, good turgor Chest:  Normal excursion, shape, no gross abn Respiratory: speaking in full sentences, no conversational dyspnea NeuroM-Sk: Ambulates w/o assistance, moves * 4 Psych: A and O *3, insight good, mood-full  DIAGNOSTIC DATA REVIEWED:  BMET    Component Value Date/Time   NA 136 03/12/2023 1033   K 4.8 03/12/2023 1033    CL 98 03/12/2023 1033   CO2 23 03/12/2023 1033   GLUCOSE 83 03/12/2023 1033   GLUCOSE 95 12/19/2022 0936   BUN 14 03/12/2023 1033   CREATININE 0.75 03/12/2023 1033   CREATININE 0.72 11/30/2016 0926   CALCIUM 8.8 03/12/2023 1033   GFRNONAA >60 06/19/2022 0535   GFRNONAA >89 11/30/2016 0926   GFRAA 96 12/08/2018 1624   GFRAA >89 11/30/2016 0926   Lab Results  Component Value Date   HGBA1C 5.9 (H) 03/12/2023   HGBA1C 5.5 01/13/2014   Lab Results  Component Value Date   INSULIN 12.4 03/12/2023   Lab Results  Component Value Date   TSH 1.660 03/12/2023   CBC    Component Value Date/Time   WBC 5.6 03/12/2023 1033   WBC 5.3 12/19/2022 0936   RBC 4.72 03/12/2023 1033   RBC 4.43 12/19/2022 0936   HGB 12.7 03/12/2023 1033   HCT 39.1 03/12/2023 1033   PLT 334 03/12/2023 1033   MCV 83 03/12/2023 1033   MCH 26.9 03/12/2023 1033   MCH 26.3 06/19/2022 0535   MCHC 32.5 03/12/2023 1033   MCHC 32.8 12/19/2022 0936   RDW 14.5 03/12/2023 1033   Iron Studies    Component Value Date/Time   IRON 44 12/19/2022 0936   TIBC 372 12/19/2022 0936   FERRITIN 5 (L) 12/19/2022 0936   IRONPCTSAT 12 (L) 12/19/2022 0936   Lipid Panel     Component Value Date/Time   CHOL 123 03/12/2023 1033   TRIG 46 03/12/2023 1033   HDL 50 03/12/2023 1033   CHOLHDL 2 12/19/2022 0936   VLDL 7.8 12/19/2022 0936   LDLCALC 62 03/12/2023 1033   Hepatic Function Panel     Component Value Date/Time   PROT 7.8 03/12/2023 1033   ALBUMIN 3.7 (L) 03/12/2023 1033   AST 18 03/12/2023 1033   ALT 12 03/12/2023 1033   ALKPHOS 96 03/12/2023 1033   BILITOT 0.3 03/12/2023 1033      Component Value Date/Time   TSH 1.660 03/12/2023 1033   Nutritional Lab Results  Component Value Date   VD25OH 19.2 (L) 03/12/2023   VD25OH 12.61 (L) 12/19/2022   VD25OH 17 (L) 01/13/2014    Attestations:   Reviewed by clinician on day of visit: allergies, medications, problem list, medical history, surgical history, family  history, social history, and previous encounter notes.    I,Special Puri,acting as a scribe for [employer], DO.,have documented all relevant documentation on the behalf of Thomasene Lot, DO,as directed by  Thomasene Lot, DO while in the presence of Thomasene Lot, DO.   I, Gavin Pound  Lonya Johannesen, DO, have reviewed all documentation for this visit. The documentation on 04/09/23 for the exam, diagnosis, procedures, and orders are all accurate and complete.

## 2023-04-11 ENCOUNTER — Encounter: Payer: Self-pay | Admitting: Physician Assistant

## 2023-04-12 NOTE — Telephone Encounter (Signed)
Please see pt message and advise 

## 2023-04-22 ENCOUNTER — Encounter (INDEPENDENT_AMBULATORY_CARE_PROVIDER_SITE_OTHER): Payer: Self-pay | Admitting: Family Medicine

## 2023-04-22 ENCOUNTER — Ambulatory Visit (INDEPENDENT_AMBULATORY_CARE_PROVIDER_SITE_OTHER): Payer: 59 | Admitting: Family Medicine

## 2023-04-22 VITALS — BP 122/80 | HR 71 | Temp 97.8°F | Ht 66.0 in | Wt 339.0 lb

## 2023-04-22 DIAGNOSIS — Z6841 Body Mass Index (BMI) 40.0 and over, adult: Secondary | ICD-10-CM

## 2023-04-22 DIAGNOSIS — R7303 Prediabetes: Secondary | ICD-10-CM

## 2023-04-22 DIAGNOSIS — E559 Vitamin D deficiency, unspecified: Secondary | ICD-10-CM | POA: Diagnosis not present

## 2023-04-22 DIAGNOSIS — K5900 Constipation, unspecified: Secondary | ICD-10-CM | POA: Diagnosis not present

## 2023-04-22 MED ORDER — METFORMIN HCL 500 MG PO TABS: ORAL_TABLET | ORAL | 0 refills | Status: DC

## 2023-04-22 NOTE — Progress Notes (Signed)
Cynthia [name], D.O.  ABFM, ABOM Specializing in Clinical Bariatric Medicine  Office located at: [address] [place], [place]  [address]     Assessment and Plan:   Meds ordered this encounter  Medications   metFORMIN (GLUCOPHAGE) 500 MG tablet    Sig: 1 po with lunch and dinner daily    Dispense:  60 tablet    Refill:  0    30 d supply;  ** OV for RF **   Do not send RF request     Prediabetes Assessment: Condition is not optimized. Lab Results  Component Value Date   HGBA1C 5.9 (H) 03/12/2023   HGBA1C 5.6 09/17/2022   HGBA1C 5.5 01/13/2014   INSULIN 12.4 03/12/2023  -  Reports that is she is still struggling with cravings during the day, but mostly in the evenings. She states that her snacking was "out of control" in the evenings on a few occasions. - She has been having these cravings despite eating all the protein/food on the meal plan.  - Endorses that she has been on Metformin in the past. Denies having any adverse effects when taking medication.   Plan: - Restart Metformin 1 po with lunch and dinner daily. Start with 1/2 tab at lunch and 1/2 tab at dinner with food and then increase in 3-4 days, if tolerated. Risks/benefits of Metformin were dicussed with pt. All questions were answered appropriately.  - Continue her prudent nutritional plan that is low in simple carbohydrates, saturated fats and trans fats to goal of 5-10% weight loss to achieve significant health benefits.  Pt encouraged to continually advance exercise and cardiovascular fitness as tolerated throughout weight loss journey.  - We will recheck A1c and fasting insulin level in approximately 3 months from last check, or as deemed appropriate.     Vitamin D deficiency Assessment: Condition is not optimized. Lab Results  Component Value Date   VD25OH 19.2 (L) 03/12/2023   VD25OH 12.61 (L) 12/19/2022   VD25OH 17 (L) 01/13/2014  - She has been compliant with Ergocalciferol 50K IU twice weekly.  Denies any adverse effects.  Plan: - Continue with OTC supplement. Denies need for refill.  - weight loss will likely improve availability of vitamin D, thus encouraged Cynthia [name] to continue with meal plan and their weight loss efforts to further improve this condition.  Thus, we will need to monitor levels regularly (every 3-4 mo on average) to keep levels within normal limits and prevent over supplementation.   Constipation, unspecified constipation type Assessment: Condition is not optimized. - Pt states that she has been having symptoms of constipation since last OV.  - She has been taking Miralax prn. PCP prescribed Linzess 72 mcg , but pt could not afford medication. - Drinking 80-120 ounces of water daily.   Plan:  Good Bowel Health: Your goal is to have one soft bowel movement each day. Patient advised to take MiraLAX twice daily. Drink at least half of your weight in ounces of water per day unless otherwise noted by one of your doctors that you must restrict water intake.   Eat plenty of fiber (goal is over 25 grams each day).  It is best to get most of your fiber from dietary sources which includes leafy green vegetables, fresh fruit, and whole grains.  You may need to add fiber with the help of OTC fiber supplements.  These include the likes Psyllium husks . If all of these changes do not work, contact  your PCP  TREATMENT PLAN FOR OBESITY: BMI 50.0-59.9, adult (HCC) Morbid obesity (HCC) Assessment:  Cynthia [name] is here to discuss her progress with her obesity treatment plan along with follow-up of her obesity related diagnoses. See Medical Weight Management Flowsheet for complete bioelectrical impedance results.  Condition is improving. Biometric data collected today, was reviewed with patient.   Since last office visit on 04/09/23 patient's  Muscle mass has increased by 4 lb. Fat mass has decreased by 6.2 lb. Total body water has decreased by 1.8 lb.  Counseling done on how  various foods will affect these numbers and how to maximize success  Total lbs lost to date: 14  Total weight loss percentage to date: 3.97   Plan:  - Continue with keeping a food journal and adhering to recommended goals of 1600-1700 calories and 160+ protein and Category 3 meal plan with 100 snack calories.    Behavioral Intervention Additional resources provided today:  Food Journaling Log Evidence-based interventions for health behavior change were utilized today including the discussion of self monitoring techniques, problem-solving barriers and SMART goal setting techniques.   Regarding patient's less desirable eating habits and patterns, we employed the technique of small changes.  Pt will specifically work on: continue with journaling her intake  for next visit.    Recommended Physical Activity Goals  Cynthia [name] has been advised to slowly work up to 150 minutes of moderate intensity aerobic activity a week and strengthening exercises 2-3 times per week for cardiovascular health, weight loss maintenance and preservation of muscle mass.   She has agreed to Continue current level of physical activity   FOLLOW UP: Return in about 2 weeks (around 05/06/2023). She was informed of the importance of frequent follow up visits to maximize her success with intensive lifestyle modifications for her multiple health conditions.   Subjective:   Chief complaint: Obesity Cynthia [name] is here to discuss her progress with her obesity treatment plan. She is on the the Category 3 Plan with 100 snack calories and keeping a food journal and adhering to recommended goals of 1600-1700 calories and 160+ protein and states she is following her eating plan approximately 60% of the time. She states she is walking 20 minutes 3 days per week.  Interval History:  Cynthia [name] is here for a follow up office visit.     Since last office visit:   - Endorses that it has been emotionally difficult because there was a  traumatic event in her family.  - Reports that she is still struggling with cravings during the day, but mostly in the evenings. She states that her snacking was "out of control" in the evenings on a few occasions. - She has 1-2 premier protein shakes a day. - On most days, she has been eating all the food/protein on the meal plan.   Review of Systems:  Pertinent positives were addressed with patient today.  Weight Summary and Biometrics   Weight Lost Since Last Visit: 2lb  Weight Gained Since Last Visit: 0lb    Vitals Temp: 97.8 F (36.6 C) BP: 122/80 Pulse Rate: 71 SpO2: 98 %   Anthropometric Measurements Height: 5\' 6"  (1.676 m) Weight: (!) 339 lb (153.8 kg) BMI (Calculated): 54.74 Weight at Last Visit: 341lb Weight Lost Since Last Visit: 2lb Weight Gained Since Last Visit: 0lb Starting Weight: 353lb Total Weight Loss (lbs): 14 lb (6.35 kg)   Body Composition  Body Fat %: 53.3 % Fat Mass (lbs): 181.2 lbs Muscle Mass (lbs): 150.6  lbs Total Body Water (lbs): 112.8 lbs Visceral Fat Rating : 21   Other Clinical Data Fasting: no Labs: no Today's Visit #: 4 Starting Date: 03/12/23   Objective:   PHYSICAL EXAM: Blood pressure 122/80, pulse 71, temperature 97.8 F (36.6 C), height 5\' 6"  (1.676 m), weight (!) 339 lb (153.8 kg), last menstrual period 03/22/2023, SpO2 98 %. Body mass index is 54.72 kg/m.  General: Well Developed, well nourished, and in no acute distress.  HEENT: Normocephalic, atraumatic Skin: Warm and dry, cap RF less 2 sec, good turgor Chest:  Normal excursion, shape, no gross abn Respiratory: speaking in full sentences, no conversational dyspnea NeuroM-Sk: Ambulates w/o assistance, moves * 4 Psych: A and O *3, insight good, mood-full  DIAGNOSTIC DATA REVIEWED:  BMET    Component Value Date/Time   NA 136 03/12/2023 1033   K 4.8 03/12/2023 1033   CL 98 03/12/2023 1033   CO2 23 03/12/2023 1033   GLUCOSE 83 03/12/2023 1033   GLUCOSE 95  12/19/2022 0936   BUN 14 03/12/2023 1033   CREATININE 0.75 03/12/2023 1033   CREATININE 0.72 11/30/2016 0926   CALCIUM 8.8 03/12/2023 1033   GFRNONAA >60 06/19/2022 0535   GFRNONAA >89 11/30/2016 0926   GFRAA 96 12/08/2018 1624   GFRAA >89 11/30/2016 0926   Lab Results  Component Value Date   HGBA1C 5.9 (H) 03/12/2023   HGBA1C 5.5 01/13/2014   Lab Results  Component Value Date   INSULIN 12.4 03/12/2023   Lab Results  Component Value Date   TSH 1.660 03/12/2023   CBC    Component Value Date/Time   WBC 5.6 03/12/2023 1033   WBC 5.3 12/19/2022 0936   RBC 4.72 03/12/2023 1033   RBC 4.43 12/19/2022 0936   HGB 12.7 03/12/2023 1033   HCT 39.1 03/12/2023 1033   PLT 334 03/12/2023 1033   MCV 83 03/12/2023 1033   MCH 26.9 03/12/2023 1033   MCH 26.3 06/19/2022 0535   MCHC 32.5 03/12/2023 1033   MCHC 32.8 12/19/2022 0936   RDW 14.5 03/12/2023 1033   Iron Studies    Component Value Date/Time   IRON 44 12/19/2022 0936   TIBC 372 12/19/2022 0936   FERRITIN 5 (L) 12/19/2022 0936   IRONPCTSAT 12 (L) 12/19/2022 0936   Lipid Panel     Component Value Date/Time   CHOL 123 03/12/2023 1033   TRIG 46 03/12/2023 1033   HDL 50 03/12/2023 1033   CHOLHDL 2 12/19/2022 0936   VLDL 7.8 12/19/2022 0936   LDLCALC 62 03/12/2023 1033   Hepatic Function Panel     Component Value Date/Time   PROT 7.8 03/12/2023 1033   ALBUMIN 3.7 (L) 03/12/2023 1033   AST 18 03/12/2023 1033   ALT 12 03/12/2023 1033   ALKPHOS 96 03/12/2023 1033   BILITOT 0.3 03/12/2023 1033      Component Value Date/Time   TSH 1.660 03/12/2023 1033   Nutritional Lab Results  Component Value Date   VD25OH 19.2 (L) 03/12/2023   VD25OH 12.61 (L) 12/19/2022   VD25OH 17 (L) 01/13/2014    Attestations:   Reviewed by clinician on day of visit: allergies, medications, problem list, medical history, surgical history, family history, social history, and previous encounter notes.   I,Special Puri,acting as a  scribe for [employer], DO.,have documented all relevant documentation on the behalf of Thomasene Lot, DO,as directed by  Thomasene Lot, DO while in the presence of Thomasene Lot, DO.   I, Thomasene Lot, DO,  have reviewed all documentation for this visit. The documentation on 04/22/23 for the exam, diagnosis, procedures, and orders are all accurate and complete.

## 2023-04-30 ENCOUNTER — Telehealth (INDEPENDENT_AMBULATORY_CARE_PROVIDER_SITE_OTHER): Payer: 59 | Admitting: Psychology

## 2023-04-30 DIAGNOSIS — F5089 Other specified eating disorder: Secondary | ICD-10-CM | POA: Diagnosis not present

## 2023-04-30 NOTE — Progress Notes (Signed)
  Office: [phone]  /  Fax: [phone]    Date: April 30, 2023    Appointment Start Time: 8:34am Duration: 26 minutes Provider: Lawerance Cruel, Psy.D. Type of Session: Individual Therapy  Location of Patient: Home in [place] (private location) Location of Provider: Provider's Home (private office) Type of Contact: Telepsychological Visit via MyChart Video Visit  Session Content: This provider called Cynthia [name] at 8:32am as she did not present for today's appointment. She stated she would join. As such, today's appointment was initiated 4 minutes late. Cynthia [name] is a 42 y.o. female presenting for a follow-up appointment to address the previously established treatment goal of increasing coping skills.Today's appointment was a telepsychological visit. Cynthia [name] provided verbal consent for today's telepsychological appointment and she is aware she is responsible for securing confidentiality on her end of the session. Prior to proceeding with today's appointment, Cynthia [name]'s physical location at the time of this appointment was obtained as well a phone number she could be reached at in the event of technical difficulties. Cynthia [name] and this provider participated in today's telepsychological service.   Of note, today's appointment was switched to a regular telephone call at 8:55am with Cynthia [name]'s verbal consent due to technical issues.   This provider conducted a brief check-in. Cynthia [name] stated she had a "rough couple of weeks" due to a "traumatic event in the family." Further explored and processed. She acknowledged the aforementioned has impacted her eating habits. Psychoeducation regarding the importance of self-care utilizing the oxygen mask metaphor was provided. Psychoeducation regarding pleasurable activities, including its impact on emotional eating and overall well-being was provided. Cynthia [name] was provided with a handout with various options of pleasurable activities, and was encouraged to engage in one  activity a day and additional activities as needed when triggered to emotionally eat. Cynthia [name] agreed. Cynthia [name] provided verbal consent during today's appointment for this provider to send a handout with activities via e-mail. Overall, Cynthia [name] was receptive to today's appointment as evidenced by openness to sharing, responsiveness to feedback, and willingness to engage in pleasurable activities to assist with coping.  Mental Status Examination:  Appearance: neat Behavior: appropriate to circumstances Mood: sad Affect: mood congruent Speech: WNL Eye Contact: appropriate Psychomotor Activity: WNL Gait: unable to assess Thought Process: linear, logical, and goal directed and no evidence or endorsement of suicidal, homicidal, and self-harm ideation, plan and intent  Thought Content/Perception: no hallucinations, delusions, bizarre thinking or behavior endorsed or observed Orientation: AAOx4 Memory/Concentration: intact Insight: fair Judgment: fair  Interventions:  Conducted a brief chart review Provided empathic reflections and validation Employed supportive psychotherapy interventions to facilitate reduced distress and to improve coping skills with identified stressors Psychoeducation provided regarding pleasurable activities Psychoeducation provided regarding self-care  DSM-5 Diagnosis(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 Progress is limited, as Cynthia [name] has just begun treatment with this provider; however, she is receptive to the interaction and interventions and rapport is being established.   Plan: The next appointment is scheduled for 05/13/2023 at 4:30pm, which will be via MyChart Video Visit. The next session will focus on working towards the established treatment goal.

## 2023-05-01 ENCOUNTER — Ambulatory Visit: Payer: 59 | Admitting: Orthopaedic Surgery

## 2023-05-06 ENCOUNTER — Ambulatory Visit (INDEPENDENT_AMBULATORY_CARE_PROVIDER_SITE_OTHER): Payer: 59 | Admitting: Family Medicine

## 2023-05-06 ENCOUNTER — Encounter (INDEPENDENT_AMBULATORY_CARE_PROVIDER_SITE_OTHER): Payer: Self-pay | Admitting: Family Medicine

## 2023-05-06 VITALS — BP 140/85 | HR 82 | Temp 97.8°F | Ht 66.0 in | Wt 336.0 lb

## 2023-05-06 DIAGNOSIS — E559 Vitamin D deficiency, unspecified: Secondary | ICD-10-CM | POA: Diagnosis not present

## 2023-05-06 DIAGNOSIS — I10 Essential (primary) hypertension: Secondary | ICD-10-CM

## 2023-05-06 DIAGNOSIS — Z6841 Body Mass Index (BMI) 40.0 and over, adult: Secondary | ICD-10-CM

## 2023-05-06 DIAGNOSIS — R7303 Prediabetes: Secondary | ICD-10-CM | POA: Diagnosis not present

## 2023-05-06 MED ORDER — METFORMIN HCL 500 MG PO TABS: ORAL_TABLET | ORAL | 0 refills | Status: DC

## 2023-05-06 MED ORDER — VITAMIN D (ERGOCALCIFEROL) 1.25 MG (50000 UNIT) PO CAPS: ORAL_CAPSULE | ORAL | 0 refills | Status: DC

## 2023-05-06 NOTE — Progress Notes (Signed)
Cynthia [name], D.O.  ABFM, ABOM Specializing in Clinical Bariatric Medicine  Office located at: [address] [place], [place]  [address]     Assessment and Plan:   Medications Discontinued During This Encounter  Medication Reason   Vitamin D, Ergocalciferol, (DRISDOL) 1.25 MG (50000 UNIT) CAPS capsule Reorder   metFORMIN (GLUCOPHAGE) 500 MG tablet Reorder     Meds ordered this encounter  Medications   Vitamin D, Ergocalciferol, (DRISDOL) 1.25 MG (50000 UNIT) CAPS capsule    Sig: 1 po q Wed and 1 po q Sun    Dispense:  8 capsule    Refill:  0   metFORMIN (GLUCOPHAGE) 500 MG tablet    Sig: 1 po with lunch and dinner daily    Dispense:  60 tablet    Refill:  0    30 d supply;  ** OV for RF **   Do not send RF request     Primary hypertension Assessment: Blood pressure is elevated today. She is asymptomatic, no concerns. Pt endorses eating a higher calorie steak yesterday and forgetting to take her Norvasc on Saturday. These factors can be contributing to her elevated blood pressure today. Overall, no issues with Toprol-XL 25 mg daily and Norvasc 7.5 mg daily. Denies any side effects.  Last 3 blood pressure readings in our office are as follows: BP Readings from Last 3 Encounters:  05/06/23 (!) 140/85  04/22/23 122/80  04/09/23 (!) 148/80   Plan: Continue with all antihypertensive medications as recommended by PCP. Reviewed with patient that her goal blood pressure should be <130/80.  Continue with Prudent nutritional plan and low sodium diet, advance exercise as tolerated.  We will continue to monitor closely alongside PCP/ specialists.  Pt reminded to also f/up with those individuals as instructed by them. We will continue to monitor symptoms as they relate to the her weight loss journey.    Vitamin D deficiency Assessment: Condition is not optimized. Endorses taking Ergocalciferol 50K international units twice weekly as prescribed and is tolerating well. Denies  any side effects.  Lab Results  Component Value Date   VD25OH 19.2 (L) 03/12/2023   VD25OH 12.61 (L) 12/19/2022   VD25OH 17 (L) 01/13/2014   Plan: Continue with supplement. Will reorder this today.    Will continue to monitor levels regularly (every 3-4 mo on average) to keep levels within normal limits and prevent over supplementation.   Prediabetes Assessment: Condition is not optimized. Cynthia [name] endorses obtaining her Metformin 500 mg last week. Since starting the medication, she has been tolerating it well. Denies any N/V/D. She has yet to notice any difference in her hunger/cravings.  Lab Results  Component Value Date   HGBA1C 5.9 (H) 03/12/2023   HGBA1C 5.6 09/17/2022   HGBA1C 5.5 01/13/2014   INSULIN 12.4 03/12/2023    Plan: Continue with Metformin. Will refill this today.     Dimples will continue to work on weight loss, exercise, via their meal plan we devised to help decrease the risk of progressing to diabetes.  We will recheck A1c and fasting insulin level in approximately 3 months from last check, or as deemed appropriate.     TREATMENT PLAN FOR OBESITY: BMI 50.0-59.9, adult (HCC) Morbid obesity (HCC) Assessment: Cynthia [name] is here to discuss her progress with her obesity treatment plan along with follow-up of her obesity related diagnoses. See Medical Weight Management Flowsheet for complete bioelectrical impedance results.  Condition is improving, but not optimized. Biometric data collected  today, was reviewed with patient.   Since last office visit on 04/22/23 patient's  Muscle mass has decreased by 7.4 lb. Fat mass has increased by 4.6 lb. Total body water has increased by 0.4 lb.  Counseling done on how various foods will affect these numbers and how to maximize success  Total lbs lost to date: 17  Total weight loss percentage to date: 4.82  Plan: Continue with keeping a food journal and adhering to recommended goals of 1600-1700 calories and 160+ protein  and Category 3 meal plan with 100 snack calories.   Behavioral Intervention Additional resources provided today: patient declined Evidence-based interventions for health behavior change were utilized today including the discussion of self monitoring techniques, problem-solving barriers and SMART goal setting techniques.   Regarding patient's less desirable eating habits and patterns, we employed the technique of small changes.  Pt will specifically work on: continue adherence to prescribed meal plan for next visit.    Recommended Physical Activity Goals  Grasyn has been advised to slowly work up to 150 minutes of moderate intensity aerobic activity a week and strengthening exercises 2-3 times per week for cardiovascular health, weight loss maintenance and preservation of muscle mass.   She has agreed to Continue current level of physical activity   FOLLOW UP: Return in about 3 weeks (around 05/27/2023). She was informed of the importance of frequent follow up visits to maximize her success with intensive lifestyle modifications for her multiple health conditions.   Subjective:   Chief complaint: Obesity Cynthia [name] is here to discuss her progress with her obesity treatment plan. She is on the the Category 3 Plan with 100 snack calories and keeping a food journal and adhering to recommended goals of 1600-1700 calories and 160+ protein and states she is following her eating plan approximately 85% of the time. She states she is walking 35 minutes 5 days per week.  Interval History:  Cynthia [name] is here for a follow up office visit.     Since last office visit, Cynthia [name] has been doing well. She endorses eating off plan foods a few times due to her busy schedule and during Father's Day. When following the meal plan, she is averaging 1700 calories and 150-170 grams of protein. She started her Metformin 500 mg BID and endorses still having carb cravings. She is tolerating the medication well. Denies  any GI upset.   Pharmacotherapy for weight loss: She is currently taking  Metformin  for medical weight loss.  Denies side effects.    Review of Systems:  Pertinent positives were addressed with patient today.  Reviewed by clinician on day of visit: allergies, medications, problem list, medical history, surgical history, family history, social history, and previous encounter notes.  Weight Summary and Biometrics   Weight Lost Since Last Visit: 3lb  Weight Gained Since Last Visit: 0lb    Vitals Temp: 97.8 F (36.6 C) BP: (!) 140/85 Pulse Rate: 82 SpO2: 98 %   Anthropometric Measurements Height: 5\' 6"  (1.676 m) Weight: (!) 336 lb (152.4 kg) BMI (Calculated): 54.26 Weight at Last Visit: 339lb Weight Lost Since Last Visit: 3lb Weight Gained Since Last Visit: 0lb Starting Weight: 353lb Total Weight Loss (lbs): 17 lb (7.711 kg)   Body Composition  Body Fat %: 55.2 % Fat Mass (lbs): 185.8 lbs Muscle Mass (lbs): 143.2 lbs Total Body Water (lbs): 113.2 lbs Visceral Fat Rating : 21   Other Clinical Data Fasting: yes Labs: no Today's Visit #: 5 Starting Date: 03/12/23  Objective:   PHYSICAL EXAM: Blood pressure (!) 140/85, pulse 82, temperature 97.8 F (36.6 C), height 5\' 6"  (1.676 m), weight (!) 336 lb (152.4 kg), last menstrual period 05/04/2023, SpO2 98 %. Body mass index is 54.23 kg/m.  General: Well Developed, well nourished, and in no acute distress.  HEENT: Normocephalic, atraumatic Skin: Warm and dry, cap RF less 2 sec, good turgor Chest:  Normal excursion, shape, no gross abn Respiratory: speaking in full sentences, no conversational dyspnea NeuroM-Sk: Ambulates w/o assistance, moves * 4 Psych: A and O *3, insight good, mood-full  DIAGNOSTIC DATA REVIEWED:  BMET    Component Value Date/Time   NA 136 03/12/2023 1033   K 4.8 03/12/2023 1033   CL 98 03/12/2023 1033   CO2 23 03/12/2023 1033   GLUCOSE 83 03/12/2023 1033   GLUCOSE 95 12/19/2022  0936   BUN 14 03/12/2023 1033   CREATININE 0.75 03/12/2023 1033   CREATININE 0.72 11/30/2016 0926   CALCIUM 8.8 03/12/2023 1033   GFRNONAA >60 06/19/2022 0535   GFRNONAA >89 11/30/2016 0926   GFRAA 96 12/08/2018 1624   GFRAA >89 11/30/2016 0926   Lab Results  Component Value Date   HGBA1C 5.9 (H) 03/12/2023   HGBA1C 5.5 01/13/2014   Lab Results  Component Value Date   INSULIN 12.4 03/12/2023   Lab Results  Component Value Date   TSH 1.660 03/12/2023   CBC    Component Value Date/Time   WBC 5.6 03/12/2023 1033   WBC 5.3 12/19/2022 0936   RBC 4.72 03/12/2023 1033   RBC 4.43 12/19/2022 0936   HGB 12.7 03/12/2023 1033   HCT 39.1 03/12/2023 1033   PLT 334 03/12/2023 1033   MCV 83 03/12/2023 1033   MCH 26.9 03/12/2023 1033   MCH 26.3 06/19/2022 0535   MCHC 32.5 03/12/2023 1033   MCHC 32.8 12/19/2022 0936   RDW 14.5 03/12/2023 1033   Iron Studies    Component Value Date/Time   IRON 44 12/19/2022 0936   TIBC 372 12/19/2022 0936   FERRITIN 5 (L) 12/19/2022 0936   IRONPCTSAT 12 (L) 12/19/2022 0936   Lipid Panel     Component Value Date/Time   CHOL 123 03/12/2023 1033   TRIG 46 03/12/2023 1033   HDL 50 03/12/2023 1033   CHOLHDL 2 12/19/2022 0936   VLDL 7.8 12/19/2022 0936   LDLCALC 62 03/12/2023 1033   Hepatic Function Panel     Component Value Date/Time   PROT 7.8 03/12/2023 1033   ALBUMIN 3.7 (L) 03/12/2023 1033   AST 18 03/12/2023 1033   ALT 12 03/12/2023 1033   ALKPHOS 96 03/12/2023 1033   BILITOT 0.3 03/12/2023 1033      Component Value Date/Time   TSH 1.660 03/12/2023 1033   Nutritional Lab Results  Component Value Date   VD25OH 19.2 (L) 03/12/2023   VD25OH 12.61 (L) 12/19/2022   VD25OH 17 (L) 01/13/2014    Attestations:   I, Special Puri, acting as a Stage manager for Thomasene Lot, DO., have compiled all relevant documentation for today's office visit on behalf of Thomasene Lot, DO, while in the presence of [employer], DO.  I  have reviewed the above documentation for accuracy and completeness, and I agree with the above. Cynthia [name], D.O.  The 21st Century Cures Act was signed into law in 2016 which includes the topic of electronic health records.  This provides immediate access to information in MyChart.  This includes consultation notes, operative notes, office notes,  lab results and pathology reports.  If you have any questions about what you read please let us know at your next visit so we can discuss your concerns and take corrective action if need be.  We are right here with you.

## 2023-05-13 ENCOUNTER — Telehealth (INDEPENDENT_AMBULATORY_CARE_PROVIDER_SITE_OTHER): Payer: Self-pay | Admitting: Psychology

## 2023-05-13 ENCOUNTER — Telehealth (INDEPENDENT_AMBULATORY_CARE_PROVIDER_SITE_OTHER): Payer: 59 | Admitting: Psychology

## 2023-05-13 NOTE — Telephone Encounter (Signed)
  Office: [phone]  /  Fax: [phone]  Date of Call: May 13, 2023  Time of Call: 4:34pm Provider: Lawerance Cruel, PsyD  CONTENT: This provider called Larene to check-in as she did not present for today's MyChart Video Visit appointment. A HIPAA compliant voicemail was left requesting a call back. Of note, this provider stayed on the MyChart Video Visit appointment for 5 minutes prior to signing off per the clinic's grace period policy.    PLAN: This provider will wait for Adlee to call back. No further follow-up planned by this provider.

## 2023-05-13 NOTE — Progress Notes (Unsigned)
  Office: [phone]  /  Fax: [phone]    Date: May 13, 2023  Appointment Start Time: *** Duration: *** minutes Provider: Lawerance Cruel, Psy.D. Type of Session: Individual Therapy  Location of Patient: {gbptloc:23249} (private location) Location of Provider: Provider's Home (private office) Type of Contact: Telepsychological Visit via MyChart Video Visit  Session Content: This provider called Cynthia [name] at 4:34pm as she did not present for today's appointment. A HIPAA compliant voicemail was left requesting a call back.  As such, today's appointment was initiated *** minutes late. [name] is a 42 y.o. female presenting for a follow-up appointment to address the previously established treatment goal of increasing coping skills.Today's appointment was a telepsychological visit. Estefanny provided verbal consent for today's telepsychological appointment and she is aware she is responsible for securing confidentiality on her end of the session. Prior to proceeding with today's appointment, Deshante's physical location at the time of this appointment was obtained as well a phone number she could be reached at in the event of technical difficulties. Bernardette and this provider participated in today's telepsychological service.   This provider conducted a brief check-in. *** [name] was receptive to today's appointment as evidenced by openness to sharing, responsiveness to feedback, and {gbreceptiveness:23401}.  Mental Status Examination:  Appearance: {Appearance:22431} Behavior: {Behavior:22445} Mood: {gbmood:21757} Affect: {Affect:22436} Speech: {Speech:22432} Eye Contact: {Eye Contact:22433} Psychomotor Activity: {Motor Activity:22434} Gait: {gbgait:23404} Thought Process: {thought process:22448}  Thought Content/Perception: {disturbances:22451} Orientation: {Orientation:22437} Memory/Concentration: {gbcognition:22449} Insight: {Insight:22446} Judgment: {Insight:22446}  Interventions:   {Interventions for Progress Notes:23405}  DSM-5 Diagnosis(es): {Diagnoses:22752}  Treatment Goal & Progress: During the initial appointment with this provider, the following treatment goal was established: increase coping skills. [name] has demonstrated progress in her goal as evidenced by {gbtxprogress:22839}. [name] also {gbtxprogress2:22951}.  Plan: The next appointment is scheduled for *** at ***, which will be via MyChart Video Visit. The next session will focus on {Plan for Next Appointment:23400}.

## 2023-05-27 ENCOUNTER — Ambulatory Visit (INDEPENDENT_AMBULATORY_CARE_PROVIDER_SITE_OTHER): Payer: 59 | Admitting: Family Medicine

## 2023-05-27 ENCOUNTER — Encounter (INDEPENDENT_AMBULATORY_CARE_PROVIDER_SITE_OTHER): Payer: Self-pay | Admitting: Family Medicine

## 2023-05-27 VITALS — BP 143/88 | HR 93 | Temp 98.0°F | Ht 66.0 in | Wt 337.0 lb

## 2023-05-27 DIAGNOSIS — R7303 Prediabetes: Secondary | ICD-10-CM | POA: Diagnosis not present

## 2023-05-27 DIAGNOSIS — I10 Essential (primary) hypertension: Secondary | ICD-10-CM

## 2023-05-27 DIAGNOSIS — E559 Vitamin D deficiency, unspecified: Secondary | ICD-10-CM | POA: Diagnosis not present

## 2023-05-27 DIAGNOSIS — Z6841 Body Mass Index (BMI) 40.0 and over, adult: Secondary | ICD-10-CM

## 2023-05-27 MED ORDER — METFORMIN HCL 500 MG PO TABS: ORAL_TABLET | ORAL | 0 refills | Status: DC

## 2023-05-27 MED ORDER — VITAMIN D (ERGOCALCIFEROL) 1.25 MG (50000 UNIT) PO CAPS: ORAL_CAPSULE | ORAL | 0 refills | Status: DC

## 2023-05-27 NOTE — Progress Notes (Signed)
[name], D.O.  ABFM, ABOM Specializing in Clinical Bariatric Medicine  Office located at: [address] [place], [place]  [address]     Assessment and Plan:   Medications Discontinued During This Encounter  Medication Reason   Vitamin D, Ergocalciferol, (DRISDOL) 1.25 MG (50000 UNIT) CAPS capsule Reorder   metFORMIN (GLUCOPHAGE) 500 MG tablet Reorder    Meds ordered this encounter  Medications   metFORMIN (GLUCOPHAGE) 500 MG tablet    Sig: 1 po with lunch and dinner daily    Dispense:  60 tablet    Refill:  0    30 d supply;  ** OV for RF **   Do not send RF request   Vitamin D, Ergocalciferol, (DRISDOL) 1.25 MG (50000 UNIT) CAPS capsule    Sig: 1 po q Wed and 1 po q Sun    Dispense:  8 capsule    Refill:  0     Prediabetes Assessment: Condition is Not at goal. Her A1c is elevated at 5.9 as of 03/12/2023. She takes Metformin 500mg  1 po lunch and dinner and she denies any side effects. Her cravings and hunger are well controlled.  Lab Results  Component Value Date   HGBA1C 5.9 (H) 03/12/2023   HGBA1C 5.6 09/17/2022   HGBA1C 5.5 01/13/2014   INSULIN 12.4 03/12/2023    Plan: Continue with the Metformin at the current dose. Will refill this today.    Gennetta will continue to work on weight loss, exercise, via their meal plan we devised to help decrease the risk of progressing to diabetes. We will recheck A1c and fasting insulin level in approximately 3 months from last check, or as deemed appropriate.    Vitamin D deficiency Assessment: Condition is Not at goal.. Her vitamin D levels have improved but are not optimized from 12/19/2022 at 12.61 to 19.2 on 03/12/2023.She continues to take Ergocalciferol 50K IU twice weekly. She started vitamin D in January 2024 and I increased her dose to twice weekly on 03/26/2023.   Lab Results  Component Value Date   VD25OH 19.2 (L) 03/12/2023   VD25OH 12.61 (L) 12/19/2022   VD25OH 17 (L) 01/13/2014   Plan: I will  recheck her vitamin D levels around 06/26/2023 since I increased her dose on 03/26/2023. She will continue with Ergocalciferol 50K IU twice weekly. Will refill this today.    Primary hypertension Assessment: Condition is not stable her BP level is elevated at 143/88. She has no issues with Toprol-XL 25 mg daily and Norvasc 7.5 mg daily . She informed me that she missed 1-2 doses of her BP medications in the last two weeks and hasn't taken any before this appointment today. She ranges 130's/80's daily when checking her BP at home.   Last 3 blood pressure readings in our office are as follows: BP Readings from Last 3 Encounters:  05/27/23 (!) 143/88  05/06/23 (!) 140/85  04/22/23 122/80    Plan: She continues Toprol and Norvasc as recommended by PCP. I advised her to continue to check her BP at home and continue to take her medications as directed. Continue with Prudent nutritional plan and low sodium diet, advance exercise as tolerated.  We will continue to monitor closely.  We will continue to monitor symptoms as they relate to the her weight loss journey.    TREATMENT PLAN FOR OBESITY: BMI 50.0-59.9, adult (HCC)- Current BMI 54.42 Morbid obesity (HCC) Assessment:  Chaunta Herrel is here to discuss her progress with  her obesity treatment plan along with follow-up of her obesity related diagnoses. See Medical Weight Management Flowsheet for complete bioelectrical impedance results.  Condition is docourse: improving but not optimized. Biometric data collected today, was reviewed with patient.   Since last office visit on 05/06/2023 patient's  Muscle mass has increased by 13lb. Fat mass has decreased by 12.8lb. Total body water has increased by 5.6lb.  Counseling done on how various foods will affect these numbers and how to maximize success  Total lbs lost to date: 16 Total weight loss percentage to date: -4.53%  Plan: Continue with keeping a food journal and adhering to recommended goals  of 1600-1700 calories and 160+ protein and Category 3 meal plan with 100 snack calories.    Behavioral Intervention Additional resources provided today: Patient declined.  Evidence-based interventions for health behavior change were utilized today including the discussion of self monitoring techniques, problem-solving barriers and SMART goal setting techniques.   Regarding patient's less desirable eating habits and patterns, we employed the technique of small changes.  Pt will specifically work on: continuing to follow the category 3 meal plan, meal prep, and making herself a priority/self care for next visit.    Recommended Physical Activity Goals  Cynda has been advised to slowly work up to 150 minutes of moderate intensity aerobic activity a week and strengthening exercises 2-3 times per week for cardiovascular health, weight loss maintenance and preservation of muscle mass.   She has agreed to Continue current level of physical activity    FOLLOW UP: No follow-ups on file. She was informed of the importance of frequent follow up visits to maximize her success with intensive lifestyle modifications for her multiple health conditions.  Subjective:   Chief complaint: Obesity Kelleigh is here to discuss her progress with her obesity treatment plan. She is on the the Category 3 Plan with 100 snack calories and keeping a food journal of 1600-1700 calories and 160+ protein and states she is following her eating plan approximately 40% of the time. She states she is walking 45 minutes 4 days per week.  Interval History:  Vickii Sader is here for a follow up office visit.     Since last OV she has noted that she has been stressed and hasn't been following her meal plan and journaling as much. She has been busy with work projects and family events, she also mentioned that she hasn't had access to her kitchen for the past week. She informed me that she misses meals when she has a busy day. She  endorses that her water intake has decreased lately.     Pharmacotherapy for weight loss: She is currently taking Metformin for medical weight loss.  Denies side effects.    Review of Systems:  Pertinent positives were addressed with patient today.  Reviewed by clinician on day of visit: allergies, medications, problem list, medical history, surgical history, family history, social history, and previous encounter notes.  Weight Summary and Biometrics   Weight Lost Since Last Visit: 0lb  Weight Gained Since Last Visit: 1lb    Vitals Temp: 98 F (36.7 C) BP: (!) 143/88 Pulse Rate: 93 SpO2: 99 %   Anthropometric Measurements Height: 5\' 6"  (1.676 m) Weight: (!) 337 lb (152.9 kg) BMI (Calculated): 54.42 Weight at Last Visit: 336lb Weight Lost Since Last Visit: 0lb Weight Gained Since Last Visit: 1lb Starting Weight: 353lb Total Weight Loss (lbs): 16 lb (7.258 kg)   Body Composition  Body Fat %: 51.3 % Fat  Mass (lbs): 173 lbs Muscle Mass (lbs): 156.2 lbs Total Body Water (lbs): 118.8 lbs Visceral Fat Rating : 20   Other Clinical Data Fasting: no Labs: no Today's Visit #: 6 Starting Date: 03/12/23     Objective:   PHYSICAL EXAM: Blood pressure (!) 143/88, pulse 93, temperature 98 F (36.7 C), height 5\' 6"  (1.676 m), weight (!) 337 lb (152.9 kg), last menstrual period 05/04/2023, SpO2 99 %. Body mass index is 54.39 kg/m.  General: Well Developed, well nourished, and in no acute distress.  HEENT: Normocephalic, atraumatic Skin: Warm and dry, cap RF less 2 sec, good turgor Chest:  Normal excursion, shape, no gross abn Respiratory: speaking in full sentences, no conversational dyspnea NeuroM-Sk: Ambulates w/o assistance, moves * 4 Psych: A and O *3, insight good, mood-full  DIAGNOSTIC DATA REVIEWED:  BMET    Component Value Date/Time   NA 136 03/12/2023 1033   K 4.8 03/12/2023 1033   CL 98 03/12/2023 1033   CO2 23 03/12/2023 1033   GLUCOSE 83  03/12/2023 1033   GLUCOSE 95 12/19/2022 0936   BUN 14 03/12/2023 1033   CREATININE 0.75 03/12/2023 1033   CREATININE 0.72 11/30/2016 0926   CALCIUM 8.8 03/12/2023 1033   GFRNONAA >60 06/19/2022 0535   GFRNONAA >89 11/30/2016 0926   GFRAA 96 12/08/2018 1624   GFRAA >89 11/30/2016 0926   Lab Results  Component Value Date   HGBA1C 5.9 (H) 03/12/2023   HGBA1C 5.5 01/13/2014   Lab Results  Component Value Date   INSULIN 12.4 03/12/2023   Lab Results  Component Value Date   TSH 1.660 03/12/2023   CBC    Component Value Date/Time   WBC 5.6 03/12/2023 1033   WBC 5.3 12/19/2022 0936   RBC 4.72 03/12/2023 1033   RBC 4.43 12/19/2022 0936   HGB 12.7 03/12/2023 1033   HCT 39.1 03/12/2023 1033   PLT 334 03/12/2023 1033   MCV 83 03/12/2023 1033   MCH 26.9 03/12/2023 1033   MCH 26.3 06/19/2022 0535   MCHC 32.5 03/12/2023 1033   MCHC 32.8 12/19/2022 0936   RDW 14.5 03/12/2023 1033   Iron Studies    Component Value Date/Time   IRON 44 12/19/2022 0936   TIBC 372 12/19/2022 0936   FERRITIN 5 (L) 12/19/2022 0936   IRONPCTSAT 12 (L) 12/19/2022 0936   Lipid Panel     Component Value Date/Time   CHOL 123 03/12/2023 1033   TRIG 46 03/12/2023 1033   HDL 50 03/12/2023 1033   CHOLHDL 2 12/19/2022 0936   VLDL 7.8 12/19/2022 0936   LDLCALC 62 03/12/2023 1033   Hepatic Function Panel     Component Value Date/Time   PROT 7.8 03/12/2023 1033   ALBUMIN 3.7 (L) 03/12/2023 1033   AST 18 03/12/2023 1033   ALT 12 03/12/2023 1033   ALKPHOS 96 03/12/2023 1033   BILITOT 0.3 03/12/2023 1033      Component Value Date/Time   TSH 1.660 03/12/2023 1033   Nutritional Lab Results  Component Value Date   VD25OH 19.2 (L) 03/12/2023   VD25OH 12.61 (L) 12/19/2022   VD25OH 17 (L) 01/13/2014    Attestations:   I, Special Puri, acting as a Stage manager for Thomasene Lot, DO., have compiled all relevant documentation for today's office visit on behalf of Thomasene Lot, DO, while in  the presence of [employer], DO.  I have reviewed the above documentation for accuracy and completeness, and I agree with the above. Ezzard Standing  Sarabelle Genson, D.O.  The 21st Century Cures Act was signed into law in 2016 which includes the topic of electronic health records.  This provides immediate access to information in MyChart.  This includes consultation notes, operative notes, office notes, lab results and pathology reports.  If you have any questions about what you read please let us know at your next visit so we can discuss your concerns and take corrective action if need be.  We are right here with you.

## 2023-06-13 ENCOUNTER — Ambulatory Visit (INDEPENDENT_AMBULATORY_CARE_PROVIDER_SITE_OTHER): Payer: 59 | Admitting: Family Medicine

## 2023-06-13 ENCOUNTER — Encounter (INDEPENDENT_AMBULATORY_CARE_PROVIDER_SITE_OTHER): Payer: Self-pay | Admitting: Family Medicine

## 2023-06-13 VITALS — HR 97 | Temp 98.0°F | Ht 66.0 in | Wt 340.0 lb

## 2023-06-13 DIAGNOSIS — I10 Essential (primary) hypertension: Secondary | ICD-10-CM | POA: Diagnosis not present

## 2023-06-13 DIAGNOSIS — K5900 Constipation, unspecified: Secondary | ICD-10-CM

## 2023-06-13 DIAGNOSIS — R7303 Prediabetes: Secondary | ICD-10-CM

## 2023-06-13 DIAGNOSIS — Z6841 Body Mass Index (BMI) 40.0 and over, adult: Secondary | ICD-10-CM

## 2023-06-13 DIAGNOSIS — E559 Vitamin D deficiency, unspecified: Secondary | ICD-10-CM

## 2023-06-13 MED ORDER — SENNA-DOCUSATE SODIUM 8.6-50 MG PO TABS
1.0000 | ORAL_TABLET | Freq: Every day | ORAL | 0 refills | Status: DC
Start: 2023-06-13 — End: 2023-09-16

## 2023-06-13 MED ORDER — VITAMIN D (ERGOCALCIFEROL) 1.25 MG (50000 UNIT) PO CAPS: ORAL_CAPSULE | ORAL | 0 refills | Status: DC

## 2023-06-13 MED ORDER — IRBESARTAN-HYDROCHLOROTHIAZIDE 150-12.5 MG PO TABS
ORAL_TABLET | ORAL | 0 refills | Status: DC
Start: 2023-06-13 — End: 2023-07-01

## 2023-06-13 MED ORDER — POLYETHYLENE GLYCOL 3350 17 GM/SCOOP PO POWD
ORAL | Status: AC
Start: 2023-06-13 — End: ?

## 2023-06-13 MED ORDER — METFORMIN HCL 500 MG PO TABS: ORAL_TABLET | ORAL | 0 refills | Status: DC

## 2023-06-13 NOTE — Progress Notes (Signed)
Cynthia [name], D.O.  ABFM, ABOM Specializing in Clinical Bariatric Medicine  Office located at: [address] [place], [place]  [address]     Assessment and Plan:   Medications Discontinued During This Encounter  Medication Reason   polyethylene glycol powder (GLYCOLAX/MIRALAX) 17 GM/SCOOP powder Reorder   metFORMIN (GLUCOPHAGE) 500 MG tablet Reorder   Vitamin D, Ergocalciferol, (DRISDOL) 1.25 MG (50000 UNIT) CAPS capsule Reorder     Meds ordered this encounter  Medications   Vitamin D, Ergocalciferol, (DRISDOL) 1.25 MG (50000 UNIT) CAPS capsule    Sig: 1 po q Wed and 1 po q Sun    Dispense:  8 capsule    Refill:  0   metFORMIN (GLUCOPHAGE) 500 MG tablet    Sig: 1 po with lunch and dinner daily    Dispense:  60 tablet    Refill:  0    30 d supply;  ** OV for RF **   Do not send RF request   irbesartan-hydrochlorothiazide (AVALIDE) 150-12.5 MG tablet    Sig: 1/2 TAB PO Q AM    Dispense:  30 tablet    Refill:  0   sennosides-docusate sodium (SENOKOT-S) 8.6-50 MG tablet    Sig: Take 1 tablet by mouth daily.    Dispense:  30 tablet    Refill:  0   polyethylene glycol powder (GLYCOLAX/MIRALAX) 17 GM/SCOOP powder    Sig: 17GR TWICE DAILY until stooling regularly, then qd     Check labs next OV   Prediabetes Assessment & Plan:  Her prediabetes is being treated with Metformin 500 mg bid. [name] tolerating medication well - denies any N/V/D. No complaints of hunger and cravings today.   Lab Results  Component Value Date   HGBA1C 5.9 (H) 03/12/2023   HGBA1C 5.6 09/17/2022   HGBA1C 5.5 01/13/2014   INSULIN 12.4 03/12/2023    Will recheck A1c and fasting insulin next OV. Will refill Metformin today; pt advised to maintain at current dose. Continue her prudent nutritional plan that is low in simple carbohydrates, saturated fats and trans fats to goal of 5-10% weight loss to achieve significant health benefits.  Pt encouraged to continually advance exercise and  cardiovascular fitness as tolerated throughout weight loss journey.      Primary hypertension Assessment & Plan: Her blood pressure is elevated today;Cynthia [name] [name] currently having back pain from a long flight and thinks this may be contributing to her elevated BP today. She is completely asymptomatic. Denies any chest pain, shortness of breath, and palpitations. Although her blood pressure has lowered since starting our program ~ 3 months, it is still not at goal. Pt [name] that at home her blood pressure is also high- on July 23rd it was 160/90 and on July 24th it was 148/82. She currently takes Toprol-XL 25 mg daily and Norvasc 7.5 mg daily per PCP.   Last 3 blood pressure readings in our office are as follows: BP Readings from Last 3 Encounters:  05/27/23 (!) 143/88  05/06/23 (!) 140/85  04/22/23 122/80   Continue both antihypertensive medications as prescribed by PCP. Additionally, begin Avalide 150-12.5 mg daily. Pt has been instructed to start with 1/2 tab PO q am for 4-6 days and if her blood pressure does not lower to increase to 1 full tablet. Discussed anticipated benefits, possible risks of starting Avalide. The patient was given the opportunity to ask questions.  All questions answered to their satisfaction.  The patient is in agreement to  this plan.   Continue checking blood pressure at home several times a week. Continue with Prudent nutritional plan and low sodium diet, advance exercise as tolerated.  We will continue to monitor closely alongside PCP/ specialists.  Pt reminded to also f/up with those individuals as instructed by them.    Vitamin D deficiency Assessment & Plan: Condition is treated with ERGO 50K lU twice weekly and she is tolerating prescribed supplement well. No concerns today.  Lab Results  Component Value Date   VD25OH 19.2 (L) 03/12/2023   VD25OH 12.61 (L) 12/19/2022   VD25OH 17 (L) 01/13/2014   Continue with their weight loss efforts, and ERGO at  current dose to further improve this condition. Will refill ERGO today.  Will continue to monitor expectantly - recheck Vitamin D next OV.    Constipation, unspecified constipation type Assessment & Plan: Cynthia [name] having less bowel movements since last OV. She is taking Miralax twice daily, drinking 108-140 ounces of water daily, and has increased her fiber intake & is also taking benefiber. Reports being prescribed Linzess in the past, but not taking medication due to cost.  Continue with current at home treatment as well as with walking regiment. Additionally, start Senokot-S 8.6-50 mg. Pt has been instructed to take this medication twice daily until stooling regularly. If all of these treatments do not work, contact your PCP.   TREATMENT PLAN FOR OBESITY: BMI 50.0-59.9, adult (HCC)- Current BMI 54.9 Morbid obesity (HCC) Assessment & Plan:  Cynthia [name] is here to discuss her progress with her obesity treatment plan along with follow-up of her obesity related diagnoses. See Medical Weight Management Flowsheet for complete bioelectrical impedance results.  Condition is not optimized. Biometric data collected today, was reviewed with patient.   Since last office visit on 05/27/23 patient's  Muscle mass has decreased by 7.6 lb. Fat mass has increased by 10.8 lb. Total body water has decreased by 0.4 lb.  Counseling done on how various foods will affect these numbers and how to maximize success  Total lbs lost to date: 13 lbs Total weight loss percentage to date: 3.68%   Continue with keeping a food journal and adhering to recommended goals of 1600-1700 calories and 140+ protein with Category 3 meal plan with 100 snack calories as a guide.   Behavioral Intervention Additional resources provided today:  Food Journaling Log Evidence-based interventions for health behavior change were utilized today including the discussion of self monitoring techniques, problem-solving barriers and  SMART goal setting techniques.   Regarding patient's less desirable eating habits and patterns, we employed the technique of small changes.  Pt will specifically work on: journaling food/bringing in food log for next visit.    Recommended Physical Activity Goals  Cynthia [name] has been advised to slowly work up to 150 minutes of moderate intensity aerobic activity a week and strengthening exercises 2-3 times per week for cardiovascular health, weight loss maintenance and preservation of muscle mass. She has agreed to Continue current level of physical activity   FOLLOW UP: Return in about 18 days (around 07/01/2023). She was informed of the importance of frequent follow up visits to maximize her success with intensive lifestyle modifications for her multiple health conditions.  Subjective:   Chief complaint: Obesity Cynthia [name] is here to discuss her progress with her obesity treatment plan. She is keeping a food journal and adhering to recommended goals of 1600-1700 calories and 160+ protein with the Category 3 meal plan with 100 snack calories as a  guide  and states she is following her eating plan approximately 10% of the time. She states she walked on vacation 40 minutes 7 days per week.  Interval History:  Cynthia [name] is here for a follow up office visit. Since last OV, Cynthia [name] has been doing well. She recently returned from a pleasant resort vacation. She enjoyed the foods there and ate items like octopus, chicken, and steak. Reports also walking a lot on vacation. In general, she walks roughly 1.4 miles every morning with her mother.   Pharmacotherapy for weight loss: She is currently taking  Metformin 500 mg bid & Topamax 100 mg  (1 and 1/2 tablet two times daily) for medical weight loss.  Denies side effects.    Review of Systems:  Pertinent positives were addressed with patient today.  Reviewed by clinician on day of visit: allergies, medications, problem list, medical history, surgical  history, family history, social history, and previous encounter notes.  Weight Summary and Biometrics   Weight Lost Since Last Visit: 0lb  Weight Gained Since Last Visit: 3lb    Vitals Temp: 98 F (36.7 C) Pulse Rate: 97 SpO2: 100 %   Anthropometric Measurements Height: 5\' 6"  (1.676 m) Weight: (!) 340 lb (154.2 kg) BMI (Calculated): 54.9 Weight at Last Visit: 337lb Weight Lost Since Last Visit: 0lb Weight Gained Since Last Visit: 3lb Starting Weight: 353lb Total Weight Loss (lbs): 13 lb (5.897 kg) Peak Weight: 362lb   Body Composition  Body Fat %: 54 % Fat Mass (lbs): 183.8 lbs Muscle Mass (lbs): 148.6 lbs Total Body Water (lbs): 118.4 lbs Visceral Fat Rating : 21   Other Clinical Data Fasting: no Labs: no Today's Visit #: 7 Starting Date: 03/12/23   Objective:   PHYSICAL EXAM: Pulse 97, temperature 98 F (36.7 C), height 5\' 6"  (1.676 m), weight (!) 340 lb (154.2 kg), SpO2 100%. Body mass index is 54.88 kg/m.  General: Well Developed, well nourished, and in no acute distress.  HEENT: Normocephalic, atraumatic Skin: Warm and dry, cap RF less 2 sec, good turgor Chest:  Normal excursion, shape, no gross abn Respiratory: speaking in full sentences, no conversational dyspnea NeuroM-Sk: Ambulates w/o assistance, moves * 4 Psych: A and O *3, insight good, mood-full  DIAGNOSTIC DATA REVIEWED:  BMET    Component Value Date/Time   NA 136 03/12/2023 1033   K 4.8 03/12/2023 1033   CL 98 03/12/2023 1033   CO2 23 03/12/2023 1033   GLUCOSE 83 03/12/2023 1033   GLUCOSE 95 12/19/2022 0936   BUN 14 03/12/2023 1033   CREATININE 0.75 03/12/2023 1033   CREATININE 0.72 11/30/2016 0926   CALCIUM 8.8 03/12/2023 1033   GFRNONAA >60 06/19/2022 0535   GFRNONAA >89 11/30/2016 0926   GFRAA 96 12/08/2018 1624   GFRAA >89 11/30/2016 0926   Lab Results  Component Value Date   HGBA1C 5.9 (H) 03/12/2023   HGBA1C 5.5 01/13/2014   Lab Results  Component Value Date    INSULIN 12.4 03/12/2023   Lab Results  Component Value Date   TSH 1.660 03/12/2023   CBC    Component Value Date/Time   WBC 5.6 03/12/2023 1033   WBC 5.3 12/19/2022 0936   RBC 4.72 03/12/2023 1033   RBC 4.43 12/19/2022 0936   HGB 12.7 03/12/2023 1033   HCT 39.1 03/12/2023 1033   PLT 334 03/12/2023 1033   MCV 83 03/12/2023 1033   MCH 26.9 03/12/2023 1033   MCH 26.3 06/19/2022 0535   MCHC 32.5 03/12/2023 1033  MCHC 32.8 12/19/2022 0936   RDW 14.5 03/12/2023 1033   Iron Studies    Component Value Date/Time   IRON 44 12/19/2022 0936   TIBC 372 12/19/2022 0936   FERRITIN 5 (L) 12/19/2022 0936   IRONPCTSAT 12 (L) 12/19/2022 0936   Lipid Panel     Component Value Date/Time   CHOL 123 03/12/2023 1033   TRIG 46 03/12/2023 1033   HDL 50 03/12/2023 1033   CHOLHDL 2 12/19/2022 0936   VLDL 7.8 12/19/2022 0936   LDLCALC 62 03/12/2023 1033   Hepatic Function Panel     Component Value Date/Time   PROT 7.8 03/12/2023 1033   ALBUMIN 3.7 (L) 03/12/2023 1033   AST 18 03/12/2023 1033   ALT 12 03/12/2023 1033   ALKPHOS 96 03/12/2023 1033   BILITOT 0.3 03/12/2023 1033      Component Value Date/Time   TSH 1.660 03/12/2023 1033   Nutritional Lab Results  Component Value Date   VD25OH 19.2 (L) 03/12/2023   VD25OH 12.61 (L) 12/19/2022   VD25OH 17 (L) 01/13/2014    Attestations:   I, Special Puri, acting as a Stage manager for Thomasene Lot, DO., have compiled all relevant documentation for today's office visit on behalf of Thomasene Lot, DO, while in the presence of Cynthia & McLennan, DO.  I have reviewed the above documentation for accuracy and completeness, and I agree with the above. Cynthia [name], D.O.  The 21st Century Cures Act was signed into law in 2016 which includes the topic of electronic health records.  This provides immediate access to information in MyChart.  This includes consultation notes, operative notes, office notes, lab results and pathology  reports.  If you have any questions about what you read please let us know at your next visit so we can discuss your concerns and take corrective action if need be.  We are right here with you.

## 2023-06-18 ENCOUNTER — Ambulatory Visit (INDEPENDENT_AMBULATORY_CARE_PROVIDER_SITE_OTHER): Payer: 59 | Admitting: Family Medicine

## 2023-06-26 ENCOUNTER — Encounter: Payer: Self-pay | Admitting: Nurse Practitioner

## 2023-06-26 ENCOUNTER — Encounter: Payer: Self-pay | Admitting: Physician Assistant

## 2023-06-26 ENCOUNTER — Ambulatory Visit (INDEPENDENT_AMBULATORY_CARE_PROVIDER_SITE_OTHER): Payer: 59 | Admitting: Physician Assistant

## 2023-06-26 VITALS — BP 136/82 | HR 80 | Temp 97.6°F | Ht 66.0 in | Wt 342.0 lb

## 2023-06-26 DIAGNOSIS — K5900 Constipation, unspecified: Secondary | ICD-10-CM | POA: Diagnosis not present

## 2023-06-26 DIAGNOSIS — R1084 Generalized abdominal pain: Secondary | ICD-10-CM

## 2023-06-26 DIAGNOSIS — N939 Abnormal uterine and vaginal bleeding, unspecified: Secondary | ICD-10-CM | POA: Diagnosis not present

## 2023-06-26 DIAGNOSIS — G43809 Other migraine, not intractable, without status migrainosus: Secondary | ICD-10-CM | POA: Diagnosis not present

## 2023-06-26 DIAGNOSIS — M797 Fibromyalgia: Secondary | ICD-10-CM

## 2023-06-26 DIAGNOSIS — I10 Essential (primary) hypertension: Secondary | ICD-10-CM

## 2023-06-26 MED ORDER — SUMATRIPTAN SUCCINATE 50 MG PO TABS
ORAL_TABLET | ORAL | 4 refills | Status: DC
Start: 2023-06-26 — End: 2023-12-23

## 2023-06-26 NOTE — Assessment & Plan Note (Signed)
Stable, refilled.

## 2023-06-26 NOTE — Progress Notes (Signed)
Subjective:    Patient ID: Cynthia [name], female    DOB: 06-16-1981, 42 y.o.   MRN: [id number]  Chief Complaint  Patient presents with   Abdominal Pain    Starts in stomach goes to back "feel like it is going through"   Follow-up    Blood pressure    Neck Pain    Back of neck and shoulder pain     Abdominal Pain  Neck Pain    Patient is in today for a few concerns.  Started having abdominal pain about 6 months. Thought tweaked her back. Then started again a few months ago. Now more persistent. Feels constantly full. Started her menstrual cycle this week & this full feeling improved. Cycle has become very irregular, hard to track. This also started about 6 months ago. Sometimes heavier and painful, other times spotting. Also having some hot flashes. Moods are 'all over the place." Weight has stalled.   Started new BP medication with her [facility] doctor. Readings are starting to improve.  Needs new PT - current one is always booked out.   Past Medical History:  Diagnosis Date   Allergy 11/20   Anxiety    Arthritis    Asthma    childhood   Asthma    Bilateral swelling of feet    Constipation    Depression 10/22/21   Fatigue    Fibromyalgia 2007   High blood pressure    Hypertension    on and off since 2007   Joint pain    Multiple food allergies    Osteoarthritis    Osteoarthritis    Palpitations    Prediabetes    Seasonal allergies    SOB (shortness of breath)    Vitamin D deficiency     No past surgical history on file.  Family History  Problem Relation Age of Onset   Arthritis Mother    Hypertension Mother    Obesity Mother    Heart disease Father    Hyperlipidemia Father    Alcohol abuse Father    Arthritis Father    Asthma Father    COPD Father    Hypertension Father    Heart failure Father    Liver disease Father    Alcoholism Father    Early death Maternal Grandmother    Hypertension Maternal Grandmother    Pneumonia Maternal Grandmother     Hearing loss Maternal Grandfather    Hypertension Maternal Grandfather    Stroke Maternal Grandfather    Vision loss Maternal Grandfather    Hypertension Paternal Grandmother    Heart disease Paternal Grandmother    Hypertension Paternal Grandfather    Heart failure Paternal Grandfather    Vision loss Paternal Grandfather     Social History   Tobacco Use   Smoking status: Never   Smokeless tobacco: Never  Vaping Use   Vaping status: Never Used  Substance Use Topics   Alcohol use: No   Drug use: No     Allergies  Allergen Reactions   Compazine [Prochlorperazine] Itching and Anxiety   Justicia Adhatoda (Malabar Nut Tree) [Justicia Adhatoda]     Tree nuts   Shellfish Allergy Rash    Review of Systems  Gastrointestinal:  Positive for abdominal pain.  Musculoskeletal:  Positive for neck pain.   NEGATIVE UNLESS OTHERWISE INDICATED IN HPI      Objective:     BP 136/82   Pulse 80   Temp 97.6 F (36.4 C) (Temporal)  Ht 5\' 6"  (1.676 m)   Wt (!) 342 lb (155.1 kg)   LMP 06/24/2023   SpO2 98%   BMI 55.20 kg/m   Wt Readings from Last 3 Encounters:  06/26/23 (!) 342 lb (155.1 kg)  06/13/23 (!) 340 lb (154.2 kg)  05/27/23 (!) 337 lb (152.9 kg)    BP Readings from Last 3 Encounters:  06/26/23 136/82  05/27/23 (!) 143/88  05/06/23 (!) 140/85     Physical Exam Constitutional:      Appearance: Normal appearance. She is morbidly obese.  HENT:     Right Ear: External ear normal.     Left Ear: External ear normal.  Cardiovascular:     Rate and Rhythm: Normal rate and regular rhythm.     Pulses: Normal pulses.     Heart sounds: Normal heart sounds.  Pulmonary:     Effort: Pulmonary effort is normal.     Breath sounds: Normal breath sounds.  Abdominal:     General: Bowel sounds are normal.     Tenderness: There is generalized abdominal tenderness.  Neurological:     Mental Status: She is alert.  Psychiatric:        Mood and Affect: Mood normal.         Behavior: Behavior normal.        Assessment & Plan:  Generalized abdominal pain -     CT ABDOMEN PELVIS W CONTRAST; Future -     Ambulatory referral to Gastroenterology  Other migraine without status migrainosus, not intractable Assessment & Plan: Stable, refilled  Orders: -     SUMAtriptan Succinate; Take 1 tab (50mg ) orally at the onset of her migraine, may repeat in 2 hours if migraine persists.  Total daily maximum dose is 200 mg  Dispense: 10 tablet; Refill: 4  Abnormal uterine bleeding unrelated to menstrual cycle -     CT ABDOMEN PELVIS W CONTRAST; Future  Constipation, unspecified constipation type Assessment & Plan: Intermittently worsening.  Linzess not covered by her insurance.  Currently taking MiraLAX twice a day.  Also taking Senokot once daily.  She continues to work with healthy weight and wellness.  Plan to get her set up with GI as she is also due for a colonoscopy starting next year.  Orders: -     Ambulatory referral to Gastroenterology  Fibromyalgia Assessment & Plan: Stable with current medication regimen. She is going to look for a new physical therapist in [place].  If she cannot find one with any openings, she is welcome to see PT at our office.  She will let me know on this.   Primary hypertension Assessment & Plan: Normotensive today. Amlodipine 7.5 mg daily Avalide 150-12.5 mg daily  Toprol XL 25 mg every day Monitor at home Keep working on lifestyle        Return CT results.     M , PA-C

## 2023-06-26 NOTE — Assessment & Plan Note (Signed)
Normotensive today. Amlodipine 7.5 mg daily Avalide 150-12.5 mg daily  Toprol XL 25 mg every day Monitor at home Keep working on lifestyle

## 2023-06-26 NOTE — Assessment & Plan Note (Signed)
Intermittently worsening.  Linzess not covered by her insurance.  Currently taking MiraLAX twice a day.  Also taking Senokot once daily.  She continues to work with healthy weight and wellness.  Plan to get her set up with GI as she is also due for a colonoscopy starting next year.

## 2023-06-26 NOTE — Assessment & Plan Note (Signed)
Stable with current medication regimen. She is going to look for a new physical therapist in [place].  If she cannot find one with any openings, she is welcome to see PT at our office.  She will let me know on this.

## 2023-07-01 ENCOUNTER — Ambulatory Visit (INDEPENDENT_AMBULATORY_CARE_PROVIDER_SITE_OTHER): Payer: 59 | Admitting: Family Medicine

## 2023-07-01 ENCOUNTER — Encounter (INDEPENDENT_AMBULATORY_CARE_PROVIDER_SITE_OTHER): Payer: Self-pay | Admitting: Family Medicine

## 2023-07-01 ENCOUNTER — Ambulatory Visit: Payer: 59 | Admitting: Physician Assistant

## 2023-07-01 VITALS — BP 125/84 | HR 70 | Temp 97.4°F | Ht 66.0 in | Wt 339.0 lb

## 2023-07-01 DIAGNOSIS — E559 Vitamin D deficiency, unspecified: Secondary | ICD-10-CM

## 2023-07-01 DIAGNOSIS — I10 Essential (primary) hypertension: Secondary | ICD-10-CM

## 2023-07-01 DIAGNOSIS — R7303 Prediabetes: Secondary | ICD-10-CM | POA: Diagnosis not present

## 2023-07-01 DIAGNOSIS — Z6841 Body Mass Index (BMI) 40.0 and over, adult: Secondary | ICD-10-CM

## 2023-07-01 DIAGNOSIS — E538 Deficiency of other specified B group vitamins: Secondary | ICD-10-CM | POA: Diagnosis not present

## 2023-07-01 MED ORDER — METFORMIN HCL 500 MG PO TABS
ORAL_TABLET | ORAL | 0 refills | Status: DC
Start: 2023-07-01 — End: 2023-09-16

## 2023-07-01 MED ORDER — VITAMIN D (ERGOCALCIFEROL) 1.25 MG (50000 UNIT) PO CAPS
ORAL_CAPSULE | ORAL | 0 refills | Status: DC
Start: 2023-07-01 — End: 2023-09-16

## 2023-07-01 MED ORDER — IRBESARTAN-HYDROCHLOROTHIAZIDE 150-12.5 MG PO TABS
ORAL_TABLET | ORAL | 0 refills | Status: DC
Start: 2023-07-01 — End: 2023-09-16

## 2023-07-01 NOTE — Progress Notes (Signed)
[name], D.O.  ABFM, ABOM Specializing in Clinical Bariatric Medicine  Office located at: [address] [place], [place]  [address]     Assessment and Plan:  Review labs with pt next OV.  Orders Placed This Encounter  Procedures   Vitamin B12   VITAMIN D 25 Hydroxy (Vit-D Deficiency, Fractures)   Insulin, random   Hemoglobin A1c   Medications Discontinued During This Encounter  Medication Reason   Vitamin D, Ergocalciferol, (DRISDOL) 1.25 MG (50000 UNIT) CAPS capsule Reorder   metFORMIN (GLUCOPHAGE) 500 MG tablet Reorder   irbesartan-hydrochlorothiazide (AVALIDE) 150-12.5 MG tablet Reorder     Meds ordered this encounter  Medications   Vitamin D, Ergocalciferol, (DRISDOL) 1.25 MG (50000 UNIT) CAPS capsule    Sig: 1 po q Wed and 1 po q Sun    Dispense:  24 capsule    Refill:  0   irbesartan-hydrochlorothiazide (AVALIDE) 150-12.5 MG tablet    Sig: 1 TAB PO Q AM    Dispense:  90 tablet    Refill:  0   metFORMIN (GLUCOPHAGE) 500 MG tablet    Sig: 1 po with lunch and dinner daily    Dispense:  180 tablet    Refill:  0    90 d supply;  ** OV for RF **   Do not send RF request    Prediabetes Assessment: Condition is Not at goal.. She endorses having excess cravings but states that she mostly eats on plan. She believes this to be because of perimenopause. She tends to follow the nutritional meal plan about 5-6 days a week. She continues Topamax and Metformin and denies any GI upset, N/V/D, or adverse side effects.  Lab Results  Component Value Date   HGBA1C 5.9 (H) 03/12/2023   HGBA1C 5.6 09/17/2022   HGBA1C 5.5 01/13/2014   INSULIN 12.4 03/12/2023    Plan: - Continue with Metformin 500mg  twice daily and Topamax 100mg  1.5 tablets daily.  I will refill today.   - In addition, we discussed the risks and benefits of various medication options which can help [place] in the management of this disease process as well as with weight loss.  Will consider starting one  of these meds in future as we will focus on prudent nutritional plan at this time. I advised her to get into contact with her insurance agency and see if they cover weight management medications.   - Continue to decrease simple carbs/ sugars; increase fiber and proteins -> follow her meal plan. I also explained role of simple carbs and insulin levels on hunger and cravings  - Anticipatory guidance given.    - We will recheck A1c and fasting insulin level in approximately 3 months from last check, or as deemed appropriate.    Vitamin D deficiency Assessment: Condition is Not at goal.. Pt vitamin D levels have improved from 12.61 to 19.2 as of 03/12/2023 but are still very low compared to the recommended vitamin D level. She continues ERGO twice weekly on Wednesday and Sunday. She denies any adverse side effects.  Lab Results  Component Value Date   VD25OH 19.2 (L) 03/12/2023   VD25OH 12.61 (L) 12/19/2022   VD25OH 17 (L) 01/13/2014   Plan: - Continue with Ergocalciferol 50K IU twice weekly. I will refill today.   - weight loss will likely improve availability of vitamin D, thus encouraged Han to continue with meal plan and their weight loss efforts to further improve this condition.  Thus,  we will need to monitor levels regularly (every 3-4 mo on average) to keep levels within normal limits and prevent over supplementation.   Primary hypertension Assessment: Condition is Controlled.Marland Kitchen He BP is well controlled with Avalide and pt tolerates this well with no adverse side effects. She continues to check her blood pressure at home and it averages between 120's-130's/80's. Last 3 blood pressure readings in our office are as follows: BP Readings from Last 3 Encounters:  07/01/23 125/84  06/26/23 136/82  05/27/23 (!) 143/88   Plan: -Continue Avalide 150-12.5mg  1 full tablet daily.  I will refill today.   - BP is 125/84 at goal today.   Lifestyle changes such as following our low salt,  heart healthy meal plan and engaging in a regular exercise program discussed   - Avoid buying foods that are: processed, frozen, or prepackaged to avoid excess salt.  - Ambulatory blood pressure monitoring encouraged.  Reminded patient that if they ever feel poorly in any way, to check their blood pressure and pulse as well.  - We will continue to monitor closely alongside PCP/ specialists.  Pt reminded to also f/up with those individuals as instructed by them.    TREATMENT PLAN FOR OBESITY: BMI 50.0-59.9, adult (HCC)- Current BMI 54.74 Morbid obesity (HCC) Assessment:  Ilaria Lesieur is here to discuss her progress with her obesity treatment plan along with follow-up of her obesity related diagnoses. See Medical Weight Management Flowsheet for complete bioelectrical impedance results.  Condition is not optimized. Biometric data collected today, was reviewed with patient.   Since last office visit on 06/13/2023 patient's  Muscle mass has decreased by 6.8lb. Fat mass has increased by 6.2lb. Total body water has decreased by 6lb.  Counseling done on how various foods will affect these numbers and how to maximize success  Total lbs lost to date: 14lbs Total weight loss percentage to date: 3.97%   Plan: - Continue to adhere to the prescribed Category 3 Plan, keeping a food journal log and adhering to recommended goals of 1600-1700 calories and 140+ protein only 100 snack calories.    Behavioral Intervention Additional resources provided today:  food journaling log Evidence-based interventions for health behavior change were utilized today including the discussion of self monitoring techniques, problem-solving barriers and SMART goal setting techniques.   Regarding patient's less desirable eating habits and patterns, we employed the technique of small changes.  Pt will specifically work on: Journal her intake and bring log into next OV for next visit.    Recommended Physical Activity  Goals  Trinette has been advised to slowly work up to 150 minutes of moderate intensity aerobic activity a week and strengthening exercises 2-3 times per week for cardiovascular health, weight loss maintenance and preservation of muscle mass.   She has agreed to Continue current level of physical activity    FOLLOW UP: Return in about 2 months (around 08/31/2023).  She was informed of the importance of frequent follow up visits to maximize her success with intensive lifestyle modifications for her multiple health conditions. Kumari Yassin is aware that we will review all of her lab results at our next visit together in person.  She is aware that if anything is critical/ life threatening with the results, we will be contacting her via MyChart or by my CMA will be calling them prior to the office visit to discuss acute management.    Subjective:   Chief complaint: Obesity Debera is here to discuss her progress with her obesity  treatment plan. She is on the the Category 3 Plan and keeping a food journal and adhering to recommended goals of 1600-1700 calories and 140+ protein only 100 snack calories and states she is following her eating plan approximately 85% of the time. She states she is walking 30 minutes 4 days per week.  Interval History:  Selyna Pradia is here for a follow up office visit.     Since last OV she has met with her PCP due to health issues and they feel as she is perimenopausal. This has caused her pain, stomach issues, fatigue, and cravings. She notes having irregular, long, and painful periods. She does journal and has met her calorie goals daily for the last couple of days but struggles with hitting her protein goals for the last couple of weeks. She does note that she goes out to eat every Saturday and this may be putting her over the calorie limits.    Pharmacotherapy for weight loss: She is currently taking Topamax and Metformin  for medical weight loss.  Denies side  effects.    Review of Systems:  Pertinent positives were addressed with patient today.   Reviewed by clinician on day of visit: allergies, medications, problem list, medical history, surgical history, family history, social history, and previous encounter notes.  Weight Summary and Biometrics   Weight Lost Since Last Visit: 1lb  Weight Gained Since Last Visit: 0lb   Vitals Temp: (!) 97.4 F (36.3 C) BP: 125/84 Pulse Rate: 70 SpO2: 97 %   Anthropometric Measurements Height: 5\' 6"  (1.676 m) Weight: (!) 339 lb (153.8 kg) BMI (Calculated): 54.74 Weight at Last Visit: 340lb Weight Lost Since Last Visit: 1lb Weight Gained Since Last Visit: 0lb Starting Weight: 353lb Total Weight Loss (lbs): 14 lb (6.35 kg) Peak Weight: 362lb   Body Composition  Body Fat %: 56 % Fat Mass (lbs): 190 lbs Muscle Mass (lbs): 141.8 lbs Total Body Water (lbs): 112.4 lbs Visceral Fat Rating : 22   Other Clinical Data Fasting: yes Labs: yes Today's Visit #: 8 Starting Date: 03/12/23     Objective:   PHYSICAL EXAM: Blood pressure 125/84, pulse 70, temperature (!) 97.4 F (36.3 C), height 5\' 6"  (1.676 m), weight (!) 339 lb (153.8 kg), last menstrual period 06/24/2023, SpO2 97%. Body mass index is 54.72 kg/m.  General: Well Developed, well nourished, and in no acute distress.  HEENT: Normocephalic, atraumatic Skin: Warm and dry, cap RF less 2 sec, good turgor Chest:  Normal excursion, shape, no gross abn Respiratory: speaking in full sentences, no conversational dyspnea NeuroM-Sk: Ambulates w/o assistance, moves * 4 Psych: A and O *3, insight good, mood-full  DIAGNOSTIC DATA REVIEWED:  BMET    Component Value Date/Time   NA 136 03/12/2023 1033   K 4.8 03/12/2023 1033   CL 98 03/12/2023 1033   CO2 23 03/12/2023 1033   GLUCOSE 83 03/12/2023 1033   GLUCOSE 95 12/19/2022 0936   BUN 14 03/12/2023 1033   CREATININE 0.75 03/12/2023 1033   CREATININE 0.72 11/30/2016 0926    CALCIUM 8.8 03/12/2023 1033   GFRNONAA >60 06/19/2022 0535   GFRNONAA >89 11/30/2016 0926   GFRAA 96 12/08/2018 1624   GFRAA >89 11/30/2016 0926   Lab Results  Component Value Date   HGBA1C 5.9 (H) 03/12/2023   HGBA1C 5.5 01/13/2014   Lab Results  Component Value Date   INSULIN 12.4 03/12/2023   Lab Results  Component Value Date   TSH 1.660 03/12/2023  CBC    Component Value Date/Time   WBC 5.6 03/12/2023 1033   WBC 5.3 12/19/2022 0936   RBC 4.72 03/12/2023 1033   RBC 4.43 12/19/2022 0936   HGB 12.7 03/12/2023 1033   HCT 39.1 03/12/2023 1033   PLT 334 03/12/2023 1033   MCV 83 03/12/2023 1033   MCH 26.9 03/12/2023 1033   MCH 26.3 06/19/2022 0535   MCHC 32.5 03/12/2023 1033   MCHC 32.8 12/19/2022 0936   RDW 14.5 03/12/2023 1033   Iron Studies    Component Value Date/Time   IRON 44 12/19/2022 0936   TIBC 372 12/19/2022 0936   FERRITIN 5 (L) 12/19/2022 0936   IRONPCTSAT 12 (L) 12/19/2022 0936   Lipid Panel     Component Value Date/Time   CHOL 123 03/12/2023 1033   TRIG 46 03/12/2023 1033   HDL 50 03/12/2023 1033   CHOLHDL 2 12/19/2022 0936   VLDL 7.8 12/19/2022 0936   LDLCALC 62 03/12/2023 1033   Hepatic Function Panel     Component Value Date/Time   PROT 7.8 03/12/2023 1033   ALBUMIN 3.7 (L) 03/12/2023 1033   AST 18 03/12/2023 1033   ALT 12 03/12/2023 1033   ALKPHOS 96 03/12/2023 1033   BILITOT 0.3 03/12/2023 1033      Component Value Date/Time   TSH 1.660 03/12/2023 1033   Nutritional Lab Results  Component Value Date   VD25OH 19.2 (L) 03/12/2023   VD25OH 12.61 (L) 12/19/2022   VD25OH 17 (L) 01/13/2014    Attestations:   This encounter took 42 total minutes of time including any pre-visit and post-visit time spent on this date of service, including taking a thorough history, reviewing any labs and/or imaging, reviewing prior notes, as well as documenting in the electronic health record on the date of service. Over 50% of that time was in  direct face-to-face counseling and coordinating care for the patient today  I, Clinical biochemist, acting as a Stage manager for [employer], DO., have compiled all relevant documentation for today's office visit on behalf of Thomasene Lot, DO, while in the presence of [employer], DO.  I have reviewed the above documentation for accuracy and completeness, and I agree with the above. [name], D.O.  The 21st Century Cures Act was signed into law in 2016 which includes the topic of electronic health records.  This provides immediate access to information in MyChart.  This includes consultation notes, operative notes, office notes, lab results and pathology reports.  If you have any questions about what you read please let us know at your next visit so we can discuss your concerns and take corrective action if need be.  We are right here with you.

## 2023-07-15 ENCOUNTER — Ambulatory Visit (INDEPENDENT_AMBULATORY_CARE_PROVIDER_SITE_OTHER): Payer: 59 | Admitting: Family Medicine

## 2023-07-15 ENCOUNTER — Other Ambulatory Visit: Payer: 59

## 2023-07-15 ENCOUNTER — Other Ambulatory Visit: Payer: Self-pay | Admitting: Physician Assistant

## 2023-07-15 DIAGNOSIS — R002 Palpitations: Secondary | ICD-10-CM

## 2023-07-15 DIAGNOSIS — I10 Essential (primary) hypertension: Secondary | ICD-10-CM

## 2023-07-30 ENCOUNTER — Other Ambulatory Visit: Payer: Self-pay | Admitting: Physician Assistant

## 2023-07-30 DIAGNOSIS — M797 Fibromyalgia: Secondary | ICD-10-CM

## 2023-08-05 ENCOUNTER — Other Ambulatory Visit: Payer: 59

## 2023-08-25 ENCOUNTER — Other Ambulatory Visit: Payer: Self-pay | Admitting: Physician Assistant

## 2023-08-25 DIAGNOSIS — M797 Fibromyalgia: Secondary | ICD-10-CM

## 2023-09-06 ENCOUNTER — Other Ambulatory Visit: Payer: Self-pay | Admitting: Physician Assistant

## 2023-09-06 DIAGNOSIS — M797 Fibromyalgia: Secondary | ICD-10-CM

## 2023-09-06 NOTE — Telephone Encounter (Signed)
Pt requesting refill for gabapentin 300 mg TID, Last OV 06/26/2023.

## 2023-09-09 NOTE — Telephone Encounter (Signed)
Please see note from pharmacy

## 2023-09-11 ENCOUNTER — Ambulatory Visit: Payer: 59 | Admitting: Nurse Practitioner

## 2023-09-16 ENCOUNTER — Encounter (INDEPENDENT_AMBULATORY_CARE_PROVIDER_SITE_OTHER): Payer: Self-pay | Admitting: Family Medicine

## 2023-09-16 ENCOUNTER — Ambulatory Visit (INDEPENDENT_AMBULATORY_CARE_PROVIDER_SITE_OTHER): Payer: 59 | Admitting: Family Medicine

## 2023-09-16 VITALS — BP 136/86 | HR 87 | Temp 98.1°F | Ht 66.0 in | Wt 327.0 lb

## 2023-09-16 DIAGNOSIS — Z6841 Body Mass Index (BMI) 40.0 and over, adult: Secondary | ICD-10-CM

## 2023-09-16 DIAGNOSIS — I10 Essential (primary) hypertension: Secondary | ICD-10-CM | POA: Diagnosis not present

## 2023-09-16 DIAGNOSIS — E559 Vitamin D deficiency, unspecified: Secondary | ICD-10-CM

## 2023-09-16 DIAGNOSIS — F5089 Other specified eating disorder: Secondary | ICD-10-CM | POA: Diagnosis not present

## 2023-09-16 DIAGNOSIS — K5900 Constipation, unspecified: Secondary | ICD-10-CM

## 2023-09-16 DIAGNOSIS — R7303 Prediabetes: Secondary | ICD-10-CM

## 2023-09-16 MED ORDER — IRBESARTAN-HYDROCHLOROTHIAZIDE 150-12.5 MG PO TABS
ORAL_TABLET | ORAL | 0 refills | Status: DC
Start: 2023-09-16 — End: 2023-10-23

## 2023-09-16 MED ORDER — SENNA-DOCUSATE SODIUM 8.6-50 MG PO TABS: 1.0000 | ORAL_TABLET | Freq: Every day | ORAL | 0 refills | Status: DC

## 2023-09-16 MED ORDER — METFORMIN HCL 500 MG PO TABS
ORAL_TABLET | ORAL | 0 refills | Status: DC
Start: 2023-09-16 — End: 2023-10-23

## 2023-09-16 MED ORDER — VITAMIN D (ERGOCALCIFEROL) 1.25 MG (50000 UNIT) PO CAPS: ORAL_CAPSULE | ORAL | 0 refills | Status: DC

## 2023-09-16 NOTE — Progress Notes (Signed)
Cynthia [name], D.O.  ABFM, ABOM Specializing in Clinical Bariatric Medicine  Office located at: [address] [place], [place]  [address]     Assessment and Plan:  Draw labs next OV. CMP, A1c, B-12, and magnesium (+/- vitamin D) Medications Discontinued During This Encounter  Medication Reason   linaclotide (LINZESS) 72 MCG capsule    sennosides-docusate sodium (SENOKOT-S) 8.6-50 MG tablet Reorder   Vitamin D, Ergocalciferol, (DRISDOL) 1.25 MG (50000 UNIT) CAPS capsule Reorder   irbesartan-hydrochlorothiazide (AVALIDE) 150-12.5 MG tablet Reorder   metFORMIN (GLUCOPHAGE) 500 MG tablet Reorder    Meds ordered this encounter  Medications   irbesartan-hydrochlorothiazide (AVALIDE) 150-12.5 MG tablet    Sig: 1 TAB PO Q AM    Dispense:  30 tablet    Refill:  0   metFORMIN (GLUCOPHAGE) 500 MG tablet    Sig: 2 po with lunch and 1 dinner daily    Dispense:  90 tablet    Refill:  0    ** OV for RF **   Do not send RF request   Vitamin D, Ergocalciferol, (DRISDOL) 1.25 MG (50000 UNIT) CAPS capsule    Sig: 1 po q Wed and 1 po q Sun    Dispense:  24 capsule    Refill:  0   sennosides-docusate sodium (SENOKOT-S) 8.6-50 MG tablet    Sig: Take 1 tablet by mouth daily.    Dispense:  30 tablet    Refill:  0  Other Specified Feeding or Eating Disorder, Emotional Eating Behaviors Assessment: Condition is Not optimized.. She informed me that she did some emotional eating as she has had some ups and downs. She has been stressed with family stress with the injury of her son, planning her wedding, and pre-menopause. In the past few weeks she has had 3 migraines. She had to take Imitrex twice which she usually does not take.   Plan: - Continue with Topiramate 100mg  BID as directed.  - Continue with Imitrex as directed.    Primary hypertension Assessment: Condition is Controlled.. Pt has not taken her BP medication this morning but will when she returns home. Since increasing her BP  medication she endorses seeing better control in her BP and averages 120-124/86. She continues to take Norvasc, Avalide, Toprol-XL.  Last 3 blood pressure readings in our office are as follows: BP Readings from Last 3 Encounters:  09/16/23 136/86  07/01/23 125/84  06/26/23 136/82   Plan: - Continue with Norvasc 5mg , Avalide 140-12.5mg , Toprol-XL 25mg  as directed. I will refill today.   Cynthia [name] BP is 136/86 at goal today.   - Ambulatory blood pressure monitoring encouraged.   - We will continue to monitor closely alongside PCP/ specialists as they relate to the her weight loss journey.   Vitamin D deficiency Assessment: Condition is Controlled.. Patient reports good compliance and tolerance of Ergocalciferol 50K IU. Her vitamin D level has improved from 19.2 to 57.1 on this supplement.  Lab Results  Component Value Date   VD25OH 57.1 07/01/2023   VD25OH 19.2 (L) 03/12/2023   VD25OH 12.61 (L) 12/19/2022   Plan: - Continue with ERGO 50K lU and she was encouraged to continue to take the medicine until told otherwise.   I will refill today.   - weight loss will likely improve availability of vitamin D, thus encouraged Cynthia [name] to continue with meal plan and their weight loss efforts to further improve this condition.  Thus, we will need to monitor levels regularly (every  3-4 mo on average) to keep levels within normal limits and prevent over supplementation.  Labs were reviewed with patient today and education provided on them. We discussed how the foods patient eats may influence these laboratory findings.  All of the patient's questions about them were answered    Prediabetes Assessment: Condition is Not at goal.. Pt has been compliant and tolerant with taking Metformin. She endorses having both hunger and cravings that are increased mid day and after dinner. Her A1c level has increased from 5.9 to 6.0 and her insulin levels also increased from 12.4 to 13.7. Her last creatinine  was 0.75.  Lab Results  Component Value Date   HGBA1C 6.0 (H) 07/01/2023   HGBA1C 5.9 (H) 03/12/2023   HGBA1C 5.6 09/17/2022   INSULIN 13.7 07/01/2023   INSULIN 12.4 03/12/2023   Plan: - Increase taking Metformin 500mg  to BID.  I will refill today.   - Anticipatory guidance given.    Cynthia [name] will continue to work on weight loss, exercise, via their meal plan we devised to help decrease the risk of progressing to diabetes.   - We will recheck A1c and fasting insulin level in approximately 3 months from last check, or as deemed appropriate.   Labs were reviewed with patient today and education provided on them. We discussed how the foods patient eats may influence these laboratory findings.  All of the patient's questions about them were answered    Constipation, unspecified constipation type Assessment: Condition is Controlled.. Pt endorses that her constipation is under control as she takes Miralax and Senekot daily. She continues to stay hydrated throughout the day.   Plan: - Continue Miralax and Senekot as directed. I will refill.   - Increase fiber intake and continue with increased water intake about half her oz in weight.    TREATMENT PLAN FOR OBESITY: BMI 50.0-59.9, adult (HCC)- Current BMI 54.74 Morbid obesity (HCC) Assessment:  Cynthia [name] is here to discuss her progress with her obesity treatment plan along with follow-up of her obesity related diagnoses. See Medical Weight Management Flowsheet for complete bioelectrical impedance results.  Condition is improving but not optimized. Biometric data collected today, was reviewed with patient.   Since last office visit on 07/01/2023 patient's  Muscle mass has decreased by 1.8lb. Fat mass has decreased by 10.4lb. Total body water has decreased by 3.6lb.  Counseling done on how various foods will affect these numbers and how to maximize success  Total lbs lost to date: 14 Total weight loss percentage to date: 3.97%    Plan: - Cynthia [name] will work on following the Category 3 Plan, keeping a food journal log and adhering to recommended goals of 1600-1700 calories and 140+ protein only 100 snack calories.   Behavioral Intervention Additional resources provided today:  food log Evidence-based interventions for health behavior change were utilized today including the discussion of self monitoring techniques, problem-solving barriers and SMART goal setting techniques.   Regarding patient's less desirable eating habits and patterns, we employed the technique of small changes.  Pt will specifically work on: Adding true protein and journal for next visit.     She has agreed to Continue current level of physical activity    FOLLOW UP: Return in about 3 weeks (around 10/07/2023).  She was informed of the importance of frequent follow up visits to maximize her success with intensive lifestyle modifications for her multiple health conditions.  Subjective:   Chief complaint: Obesity Cynthia [name] is here to discuss her progress  with her obesity treatment plan. She is on the Category 3 Plan, keeping a food journal log and adhering to recommended goals of 1600-1700 calories and 140+ protein only 100 snack calories  and states she is following her eating plan approximately 80-85% of the time. She states she is walking 60 minutes 3 days per week.  Interval History:  Cynthia [name] is here for a follow up office visit.     Since last office visit:  She did inform me that she did some emotional eating. She has increased her physical activity and does about 2+ miles of walking.  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 She states that she prefers to workout/walk outside instead of in a gym as she does not like to be around a [name] of people. She informed me that she had a bad respiratory infection and Covid at the beginning of October. She has been journaling not 100% but consistently. She averages between 1500-16000  calories and majority meets her protein goals about 80%. She does not have a problem with consuming water and she drinks about 100oz of water daily.  We reviewed her meal plan and all questions were answered.   Pharmacotherapy for weight loss: She is currently taking Topamax and Metformin  for medical weight loss.  Denies side effects.    Review of Systems:  Pertinent positives were addressed with patient today.  Reviewed by clinician on day of visit: allergies, medications, problem list, medical history, surgical history, family history, social history, and previous encounter notes.  Weight Summary and Biometrics   Weight Lost Since Last Visit: 12lb  Weight Gained Since Last Visit: 0lb   Vitals Temp: 98.1 F (36.7 C) BP: 136/86 Pulse Rate: 87 SpO2: 97 %   Anthropometric Measurements Height: 5\' 6"  (1.676 m) Weight: (!) 327 lb (148.3 kg) BMI (Calculated): 52.8 Weight at Last Visit: 339lb Weight Lost Since Last Visit: 12lb Weight Gained Since Last Visit: 0lb Starting Weight: 353lb Total Weight Loss (lbs): 26 lb (11.8 kg) Peak Weight: 362lb   Body Composition  Body Fat %: 54.9 % Fat Mass (lbs): 179.6 lbs Muscle Mass (lbs): 140 lbs Total Body Water (lbs): 108.8 lbs Visceral Fat Rating : 20   Other Clinical Data Fasting: no Labs: no Today's Visit #: 9 Starting Date: 03/12/23     Objective:   PHYSICAL EXAM: Blood pressure 136/86, pulse 87, temperature 98.1 F (36.7 C), height 5\' 6"  (1.676 m), weight (!) 327 lb (148.3 kg), SpO2 97%. Body mass index is 52.78 kg/m.  General: Well Developed, well nourished, and in no acute distress.  HEENT: Normocephalic, atraumatic Skin: Warm and dry, cap RF less 2 sec, good turgor Chest:  Normal excursion, shape, no gross abn Respiratory: speaking in full sentences, no conversational dyspnea NeuroM-Sk: Ambulates w/o assistance, moves * 4 Psych: A and O *3, insight good, mood-full  DIAGNOSTIC DATA REVIEWED:  BMET     Component Value Date/Time   NA 136 03/12/2023 1033   K 4.8 03/12/2023 1033   CL 98 03/12/2023 1033   CO2 23 03/12/2023 1033   GLUCOSE 83 03/12/2023 1033   GLUCOSE 95 12/19/2022 0936   BUN 14 03/12/2023 1033   CREATININE 0.75 03/12/2023 1033   CREATININE 0.72 11/30/2016 0926   CALCIUM 8.8 03/12/2023 1033   GFRNONAA >60 06/19/2022 0535   GFRNONAA >89 11/30/2016 0926   GFRAA 96 12/08/2018 1624   GFRAA >89 11/30/2016 0926   Lab Results  Component Value Date  HGBA1C 6.0 (H) 07/01/2023   HGBA1C 5.5 01/13/2014   Lab Results  Component Value Date   INSULIN 13.7 07/01/2023   INSULIN 12.4 03/12/2023   Lab Results  Component Value Date   TSH 1.660 03/12/2023   CBC    Component Value Date/Time   WBC 5.6 03/12/2023 1033   WBC 5.3 12/19/2022 0936   RBC 4.72 03/12/2023 1033   RBC 4.43 12/19/2022 0936   HGB 12.7 03/12/2023 1033   HCT 39.1 03/12/2023 1033   PLT 334 03/12/2023 1033   MCV 83 03/12/2023 1033   MCH 26.9 03/12/2023 1033   MCH 26.3 06/19/2022 0535   MCHC 32.5 03/12/2023 1033   MCHC 32.8 12/19/2022 0936   RDW 14.5 03/12/2023 1033   Iron Studies    Component Value Date/Time   IRON 44 12/19/2022 0936   TIBC 372 12/19/2022 0936   FERRITIN 5 (L) 12/19/2022 0936   IRONPCTSAT 12 (L) 12/19/2022 0936   Lipid Panel     Component Value Date/Time   CHOL 123 03/12/2023 1033   TRIG 46 03/12/2023 1033   HDL 50 03/12/2023 1033   CHOLHDL 2 12/19/2022 0936   VLDL 7.8 12/19/2022 0936   LDLCALC 62 03/12/2023 1033   Hepatic Function Panel     Component Value Date/Time   PROT 7.8 03/12/2023 1033   ALBUMIN 3.7 (L) 03/12/2023 1033   AST 18 03/12/2023 1033   ALT 12 03/12/2023 1033   ALKPHOS 96 03/12/2023 1033   BILITOT 0.3 03/12/2023 1033      Component Value Date/Time   TSH 1.660 03/12/2023 1033   Nutritional Lab Results  Component Value Date   VD25OH 57.1 07/01/2023   VD25OH 19.2 (L) 03/12/2023   VD25OH 12.61 (L) 12/19/2022    Attestations:   I, Medical illustrator, acting as a Stage manager for [employer], DO., have compiled all relevant documentation for today's office visit on behalf of Cynthia Lot, DO, while in the presence of [employer], DO.  I have reviewed the above documentation for accuracy and completeness, and I agree with the above. Cynthia [name], D.O.  The 21st Century Cures Act was signed into law in 2016 which includes the topic of electronic health records.  This provides immediate access to information in MyChart.  This includes consultation notes, operative notes, office notes, lab results and pathology reports.  If you have any questions about what you read please let us know at your next visit so we can discuss your concerns and take corrective action if need be.  We are right here with you.

## 2023-10-01 ENCOUNTER — Other Ambulatory Visit: Payer: Self-pay | Admitting: Physician Assistant

## 2023-10-01 DIAGNOSIS — M797 Fibromyalgia: Secondary | ICD-10-CM

## 2023-10-07 ENCOUNTER — Ambulatory Visit (INDEPENDENT_AMBULATORY_CARE_PROVIDER_SITE_OTHER): Payer: 59 | Admitting: Family Medicine

## 2023-10-08 ENCOUNTER — Ambulatory Visit (INDEPENDENT_AMBULATORY_CARE_PROVIDER_SITE_OTHER): Payer: 59 | Admitting: Family Medicine

## 2023-10-10 ENCOUNTER — Ambulatory Visit (INDEPENDENT_AMBULATORY_CARE_PROVIDER_SITE_OTHER): Payer: 59 | Admitting: Family Medicine

## 2023-10-16 ENCOUNTER — Ambulatory Visit (INDEPENDENT_AMBULATORY_CARE_PROVIDER_SITE_OTHER): Payer: 59 | Admitting: Adult Health

## 2023-10-23 ENCOUNTER — Ambulatory Visit (INDEPENDENT_AMBULATORY_CARE_PROVIDER_SITE_OTHER): Payer: 59 | Admitting: Adult Health

## 2023-10-23 ENCOUNTER — Encounter (INDEPENDENT_AMBULATORY_CARE_PROVIDER_SITE_OTHER): Payer: Self-pay | Admitting: Adult Health

## 2023-10-23 VITALS — BP 154/81 | HR 87 | Temp 97.5°F | Ht 66.0 in | Wt 333.0 lb

## 2023-10-23 DIAGNOSIS — E538 Deficiency of other specified B group vitamins: Secondary | ICD-10-CM

## 2023-10-23 DIAGNOSIS — I10 Essential (primary) hypertension: Secondary | ICD-10-CM

## 2023-10-23 DIAGNOSIS — Z6841 Body Mass Index (BMI) 40.0 and over, adult: Secondary | ICD-10-CM

## 2023-10-23 DIAGNOSIS — E669 Obesity, unspecified: Secondary | ICD-10-CM

## 2023-10-23 DIAGNOSIS — E559 Vitamin D deficiency, unspecified: Secondary | ICD-10-CM

## 2023-10-23 DIAGNOSIS — R7303 Prediabetes: Secondary | ICD-10-CM | POA: Diagnosis not present

## 2023-10-23 DIAGNOSIS — K5900 Constipation, unspecified: Secondary | ICD-10-CM

## 2023-10-23 MED ORDER — METFORMIN HCL 500 MG PO TABS: ORAL_TABLET | ORAL | 0 refills | Status: DC

## 2023-10-23 MED ORDER — IRBESARTAN-HYDROCHLOROTHIAZIDE 150-12.5 MG PO TABS: ORAL_TABLET | ORAL | 0 refills | Status: DC

## 2023-10-23 MED ORDER — SENNA-DOCUSATE SODIUM 8.6-50 MG PO TABS: 1.0000 | ORAL_TABLET | Freq: Every day | ORAL | 0 refills | Status: DC

## 2023-10-23 MED ORDER — VITAMIN D (ERGOCALCIFEROL) 1.25 MG (50000 UNIT) PO CAPS
ORAL_CAPSULE | ORAL | 0 refills | Status: DC
Start: 2023-10-23 — End: 2023-12-16

## 2023-10-23 NOTE — Progress Notes (Signed)
WEIGHT SUMMARY AND BIOMETRICS  Vitals Temp: (!) 97.5 F (36.4 C) BP: (!) 154/81 Pulse Rate: 87 SpO2: 96 %   Anthropometric Measurements Height: 5\' 6"  (1.676 m) Weight: (!) 333 lb (151 kg) BMI (Calculated): 53.77 Weight at Last Visit: 327lb Weight Lost Since Last Visit: 0 Weight Gained Since Last Visit: 6lb Starting Weight: 353lb Total Weight Loss (lbs): 20 lb (9.072 kg) Peak Weight: 362lb   Body Composition  Body Fat %: 55.3 % Fat Mass (lbs): 184.2 lbs Muscle Mass (lbs): 141.6 lbs Total Body Water (lbs): 112 lbs Visceral Fat Rating : 21   Other Clinical Data Fasting: no Labs: no Today's Visit #: 10 Starting Date: 03/12/23    Chief Complaint:   OBESITY Cynthia [name] is here to discuss her progress with her obesity treatment plan. She is on the the Category 3 Plan/Journal and states she is following her eating plan approximately 85 % of the time.  She states she is exercising Walking and Water Aerobics 2 miles/45 minutes 3-4 times per week.   Interim History:  She is has pronounced fibromyalgia  She reports R shoulder and R quadriceps pain are the most pronounced areas of discomfort.  She recently lost a cousin that she was quite close with, "Cynthia [name]". She passed away 11/07/2023- age 44 from aggressive brain cancer. "Cynthia [name]" has left behind 3 children and 1 grandchild  Since her passing, [name] endorses increased emotional eating, ie: Caramel Candies  Subjective:   1. Primary hypertension BP above goal at morning OV She has NOT taken any of antihypertensives today She denies acute cardiac sx's at present  2. Vitamin D deficiency She is on bi-weekly Ergocalciferol- denies N/V/Muscle Weakness  3. Prediabetes Lab Results  Component Value Date   HGBA1C 6.0 (H) 07/01/2023   HGBA1C 5.9 (H) 03/12/2023   HGBA1C 5.6 09/17/2022    She reports family hx: both sets of grandparents were/are diabetic She is on child and neither of parents currently  diabetic  4. Vitamin B12 deficiency PCP provided daily Ferrous Sulfate Rx  5. Constipation, unspecified constipation type  Cat 3 Meal plan+ Miralax+ Colace will result in daily BMs If she reduces water intake or eats "off plan", she will experience constipation.  She currently denies abdominal pain or hematochezia  Assessment/Plan:   1. Primary hypertension Check Labs and Refill - Comprehensive metabolic panel - irbesartan-hydrochlorothiazide (AVALIDE) 150-12.5 MG tablet; 1 TAB PO Q AM  Dispense: 30 tablet; Refill: 0  2. Vitamin D deficiency Check Labs and Refill - VITAMIN D 25 Hydroxy (Vit-D Deficiency, Fractures) - Vitamin D, Ergocalciferol, (DRISDOL) 1.25 MG (50000 UNIT) CAPS capsule; 1 po q Wed and 1 po q Sun  Dispense: 24 capsule; Refill: 0  3. Prediabetes Check Labs and Refill - Hemoglobin A1c - Insulin, random - Magnesium - metFORMIN (GLUCOPHAGE) 500 MG tablet; 2 po with lunch and 1 dinner daily  Dispense: 90 tablet; Refill: 0  4. Vitamin B12 deficiency Check Labs - Vitamin B12  6. Constipation, unspecified constipation type Refill - sennosides-docusate sodium (SENOKOT-S) 8.6-50 MG tablet; Take 1 tablet by mouth daily.  Dispense: 30 tablet; Refill: 0  57. BMI 50.0-59.9, adult (HCC)- Current BMI 53.77  Cynthia [name] is currently in the action stage of change. As such, her goal is to continue with weight loss efforts. She has agreed to the Category 3 Plan./Journal  Exercise goals: For substantial health benefits, adults should do at least 150 minutes (2 hours and 30 minutes) a week of moderate-intensity, or 75  minutes (1 hour and 15 minutes) a week of vigorous-intensity aerobic physical activity, or an equivalent combination of moderate- and vigorous-intensity aerobic activity. Aerobic activity should be performed in episodes of at least 10 minutes, and preferably, it should be spread throughout the week.  Behavioral modification strategies: increasing lean protein intake,  decreasing simple carbohydrates, increasing vegetables, increasing water intake, no skipping meals, meal planning and cooking strategies, keeping healthy foods in the home, ways to avoid boredom eating, better snacking choices, emotional eating strategies, and planning for success.  [name] has agreed to follow-up with our clinic in 2 weeks. She was informed of the importance of frequent follow-up visits to maximize her success with intensive lifestyle modifications for her multiple health conditions.   Cynthia [name] was informed we would discuss her lab results at her next visit unless there is a critical issue that needs to be addressed sooner. Cynthia [name] agreed to keep her next visit at the agreed upon time to discuss these results.  Objective:   Blood pressure (!) 154/81, pulse 87, temperature (!) 97.5 F (36.4 C), height 5\' 6"  (1.676 m), weight (!) 333 lb (151 kg), last menstrual period 10/12/2023, SpO2 96%. Body mass index is 53.75 kg/m.  General: Cooperative, alert, well developed, in no acute distress. HEENT: Conjunctivae and lids unremarkable. Cardiovascular: Regular rhythm.  Lungs: Normal work of breathing. Neurologic: No focal deficits.   Lab Results  Component Value Date   CREATININE 0.75 03/12/2023   BUN 14 03/12/2023   NA 136 03/12/2023   K 4.8 03/12/2023   CL 98 03/12/2023   CO2 23 03/12/2023   Lab Results  Component Value Date   ALT 12 03/12/2023   AST 18 03/12/2023   ALKPHOS 96 03/12/2023   BILITOT 0.3 03/12/2023   Lab Results  Component Value Date   HGBA1C 6.0 (H) 07/01/2023   HGBA1C 5.9 (H) 03/12/2023   HGBA1C 5.6 09/17/2022   HGBA1C 5.5 01/13/2014   Lab Results  Component Value Date   INSULIN 13.7 07/01/2023   INSULIN 12.4 03/12/2023   Lab Results  Component Value Date   TSH 1.660 03/12/2023   Lab Results  Component Value Date   CHOL 123 03/12/2023   HDL 50 03/12/2023   LDLCALC 62 03/12/2023   TRIG 46 03/12/2023   CHOLHDL 2 12/19/2022   Lab  Results  Component Value Date   VD25OH 57.1 07/01/2023   VD25OH 19.2 (L) 03/12/2023   VD25OH 12.61 (L) 12/19/2022   Lab Results  Component Value Date   WBC 5.6 03/12/2023   HGB 12.7 03/12/2023   HCT 39.1 03/12/2023   MCV 83 03/12/2023   PLT 334 03/12/2023   Lab Results  Component Value Date   IRON 44 12/19/2022   TIBC 372 12/19/2022   FERRITIN 5 (L) 12/19/2022    Attestation Statements:   Reviewed by clinician on day of visit: allergies, medications, problem list, medical history, surgical history, family history, social history, and previous encounter notes.  I have reviewed the above documentation for accuracy and completeness, and I agree with the above. -  Embry Manrique d. Amarisa Wilinski, NP-C

## 2023-10-25 LAB — HEMOGLOBIN A1C
Est. average glucose Bld gHb Est-mCnc: 117 mg/dL
Hgb A1c MFr Bld: 5.7 % — ABNORMAL HIGH (ref 4.8–5.6)

## 2023-10-25 LAB — COMPREHENSIVE METABOLIC PANEL
ALT: 22 [IU]/L (ref 0–32)
AST: 18 [IU]/L (ref 0–40)
Albumin: 3.7 g/dL — ABNORMAL LOW (ref 3.9–4.9)
Alkaline Phosphatase: 90 [IU]/L (ref 44–121)
BUN/Creatinine Ratio: 13 (ref 9–23)
BUN: 12 mg/dL (ref 6–24)
Bilirubin Total: 0.4 mg/dL (ref 0.0–1.2)
CO2: 23 mmol/L (ref 20–29)
Calcium: 8.5 mg/dL — ABNORMAL LOW (ref 8.7–10.2)
Chloride: 103 mmol/L (ref 96–106)
Creatinine, Ser: 0.91 mg/dL (ref 0.57–1.00)
Globulin, Total: 3.5 g/dL (ref 1.5–4.5)
Glucose: 83 mg/dL (ref 70–99)
Potassium: 4.6 mmol/L (ref 3.5–5.2)
Sodium: 139 mmol/L (ref 134–144)
Total Protein: 7.2 g/dL (ref 6.0–8.5)
eGFR: 80 mL/min/{1.73_m2} (ref >59)

## 2023-10-25 LAB — INSULIN, RANDOM: INSULIN: 14 u[IU]/mL (ref 2.6–24.9)

## 2023-10-25 LAB — VITAMIN D 25 HYDROXY (VIT D DEFICIENCY, FRACTURES): Vit D, 25-Hydroxy: 52.5 ng/mL (ref 30.0–100.0)

## 2023-10-25 LAB — VITAMIN B12: Vitamin B-12: 351 pg/mL (ref 232–1245)

## 2023-10-25 LAB — MAGNESIUM: Magnesium: 2.1 mg/dL (ref 1.6–2.3)

## 2023-11-04 ENCOUNTER — Ambulatory Visit (INDEPENDENT_AMBULATORY_CARE_PROVIDER_SITE_OTHER): Payer: 59 | Admitting: Family Medicine

## 2023-11-19 ENCOUNTER — Other Ambulatory Visit: Payer: Self-pay | Admitting: Physician Assistant

## 2023-11-19 DIAGNOSIS — I10 Essential (primary) hypertension: Secondary | ICD-10-CM

## 2023-11-25 ENCOUNTER — Ambulatory Visit (INDEPENDENT_AMBULATORY_CARE_PROVIDER_SITE_OTHER): Payer: 59 | Admitting: Family Medicine

## 2023-12-16 ENCOUNTER — Ambulatory Visit: Payer: 59 | Admitting: Physician Assistant

## 2023-12-16 ENCOUNTER — Encounter (INDEPENDENT_AMBULATORY_CARE_PROVIDER_SITE_OTHER): Payer: Self-pay | Admitting: Family Medicine

## 2023-12-16 ENCOUNTER — Ambulatory Visit (INDEPENDENT_AMBULATORY_CARE_PROVIDER_SITE_OTHER): Payer: 59 | Admitting: Family Medicine

## 2023-12-16 VITALS — BP 145/86 | HR 82 | Temp 99.0°F | Ht 66.0 in | Wt 331.0 lb

## 2023-12-16 DIAGNOSIS — G43809 Other migraine, not intractable, without status migrainosus: Secondary | ICD-10-CM | POA: Diagnosis not present

## 2023-12-16 DIAGNOSIS — E559 Vitamin D deficiency, unspecified: Secondary | ICD-10-CM

## 2023-12-16 DIAGNOSIS — E538 Deficiency of other specified B group vitamins: Secondary | ICD-10-CM

## 2023-12-16 DIAGNOSIS — R7303 Prediabetes: Secondary | ICD-10-CM

## 2023-12-16 DIAGNOSIS — I10 Essential (primary) hypertension: Secondary | ICD-10-CM | POA: Diagnosis not present

## 2023-12-16 DIAGNOSIS — Z6841 Body Mass Index (BMI) 40.0 and over, adult: Secondary | ICD-10-CM

## 2023-12-16 MED ORDER — IRBESARTAN-HYDROCHLOROTHIAZIDE 150-12.5 MG PO TABS: ORAL_TABLET | ORAL | 0 refills | Status: DC

## 2023-12-16 MED ORDER — METFORMIN HCL 500 MG PO TABS: ORAL_TABLET | ORAL | 0 refills | Status: DC

## 2023-12-16 MED ORDER — CYANOCOBALAMIN 500 MCG PO TABS: 1000.0000 ug | ORAL_TABLET | Freq: Every day | ORAL | Status: AC

## 2023-12-16 MED ORDER — VITAMIN D (ERGOCALCIFEROL) 1.25 MG (50000 UNIT) PO CAPS
ORAL_CAPSULE | ORAL | 0 refills | Status: DC
Start: 2023-12-16 — End: 2024-02-17

## 2023-12-16 NOTE — Progress Notes (Signed)
[name], D.O.  ABFM, ABOM Specializing in Clinical Bariatric Medicine  Office located at: [address] [place], [place]  [address]   Assessment and Plan:   FOR THE DISEASE OF OBESITY: BMI 50.0-59.9, adult (HCC)- Current BMI 53.45 Morbid obesity (HCC), 56.98 Assessment & Plan: Since last office visit on 10/23/23 patient's muscle mass has {DID:29233} by ***lb. Fat mass has {DID:29233} by ***lb. Total body water has {DID:29233} by ***lb.  Counseling done on how various foods will affect these numbers and how to maximize success  Total lbs lost to date: *** Total weight loss percentage to date: ***    Recommended Dietary Goals Cynthia [name] is currently in the action stage of change. As such, her goal is to continue weight management plan.  She has agreed to: continue current plan   Behavioral Intervention We discussed the following today: {dowtlossstrategies:31654}  Additional resources provided today: Handout and personalized instruction on tracking and journaling using Apps or handwriting and using logs provided  Evidence-based interventions for health behavior change were utilized today including the discussion of self monitoring techniques, problem-solving barriers and SMART goal setting techniques.   Regarding patient's less desirable eating habits and patterns, we employed the technique of small changes.   Pt will specifically work on: Journal at least 5 days a week and write on paper log for next visit.    Recommended Physical Activity Goals Cynthia [name] has been advised to work up to 150 minutes of moderate intensity aerobic activity a week and strengthening exercises 2-3 times per week for cardiovascular health, weight loss maintenance and preservation of muscle mass.   She has agreed to :  {EMEXERCISE:28847::"Think about enjoyable ways to increase daily physical activity and overcoming barriers to exercise","Increase physical activity in their day and reduce  sedentary time (increase NEAT)."}   Pharmacotherapy We discussed various medication options to help Cynthia [name] with her weight loss efforts and we both agreed to : {EMagreedrx:29170}   FOR ASSOCIATED CONDITIONS ADDRESSED TODAY: Prediabetes Assessment & Plan: Lab Results  Component Value Date   HGBA1C 5.7 (H) 10/23/2023   HGBA1C 6.0 (H) 07/01/2023   HGBA1C 5.9 (H) 03/12/2023   INSULIN 14.0 10/23/2023   INSULIN 13.7 07/01/2023   INSULIN 12.4 03/12/2023    *** A1c has improved  Still taking metformin 1000 mg at lunch and 500 mg at dinner.  Still struggles with sugar cravings, esp when not meeting her protein goals  Mostly at nighttime  Taking metformin  -increase metformin to 2 tabs at dinner -albumin low - not getting enough protein     Primary hypertension Assessment & Plan: BP Readings from Last 3 Encounters:  12/16/23 (!) 145/86  10/23/23 (!) 154/81  09/16/23 136/86    Taking amlodipine, avalide, and metoprolol (per pt only taking 1 half tab of metoprolol) Has been taking medicaitons to help with her cold ("the crud") -- did not take her BP meds this morning.  Runs 128-130/85 Recently (last 4 days ) 150/90 - believes bc last week she was vomiting - not able to keep things down  Also had a fever  Also dehydrated - not getting her water in  - would want to ween down metoprolol as she loses weight -c/w checking in with PCP  -do not rec to increase metoprolol   Was taking metoprolol for erratic heart beat  Not an issue since taking metoprolol      Vitamin D deficiency Assessment & Plan: Last vitamin D Lab Results  Component Value Date  VD25OH 52.5 10/23/2023   *** At goal  Taking Wed/Sun     Other migraine without status migrainosus, not intractable Assessment & Plan:  Still taking Topiramate -  Also helps with hunger/cravings     ***   Vitamin B12 deficiency Assessment & Plan: Lab Results  Component Value Date   VITAMINB12 351 10/23/2023    *** Pt is taking for B12 500 daily  -increase to 1000 per day        Follow up:   Return in about 3 weeks (around 01/06/2024). She was informed of the importance of frequent follow up visits to maximize her success with intensive lifestyle modifications for her multiple health conditions.  Subjective:   Chief complaint: Obesity Cynthia [name] is here to discuss her progress with her obesity treatment plan. She is on the Category 3 Plan and keeping a food journal and adhering to recommended goals of 1700 calories and 160+ g of  protein and states she is following her eating plan approximately 50-60% of the time. She states she is exercising (treadmill/weights/cardio) 30-60 minutes 3 days per week.  Interval History:  Cynthia [name] is here for a follow up office visit. Since last OV, she is down 2 lbs. When she is not on plan, her body lets her know with fatigue and "sluggish".   Had to eat out for convenience lately, but has gotten comfortable with what foods to eat that are on plan.   Hitting journaling goals  Mostly meeting 1600-1700 and protein usually 145-160 g of protein  When not meeting, she usually is meeting her calories but not her protein -   Yogurt, cheeses, [employer] skim milk  Eating green leafy vegetables a lot.     Barriers identified: {EMOBESITYBARRIERS:28841}.   Pharmacotherapy for weight loss: She is currently taking {EMPharmaco:28845}.   Review of Systems:  Pertinent positives were addressed with patient today.  Reviewed by clinician on day of visit: allergies, medications, problem list, medical history, surgical history, family history, social history, and previous encounter notes.  Weight Summary and Biometrics   Weight Lost Since Last Visit: 2 lb  Weight Gained Since Last Visit: 0 lb  ***  Vitals Temp: 99 F (37.2 C) BP: (!) 145/86 Pulse Rate: 82 SpO2: 95 %   Anthropometric Measurements Height: 5\' 6"  (1.676 m) Weight: (!) 331 lb (150.1  kg) BMI (Calculated): 53.45 Weight at Last Visit: 333 lb Weight Lost Since Last Visit: 2 lb Weight Gained Since Last Visit: 0 lb Starting Weight: 353 lb Total Weight Loss (lbs): 22 lb (9.979 kg) Peak Weight: 362 lb   Body Composition  Body Fat %: 53.8 % Fat Mass (lbs): 53.8 lbs Muscle Mass (lbs): 145.4 lbs Total Body Water (lbs): 108 lbs Visceral Fat Rating : 20   Other Clinical Data Fasting: No Labs: No Today's Visit #: 11 Starting Date: 03/12/23    Objective:   PHYSICAL EXAM: Blood pressure (!) 145/86, pulse 82, temperature 99 F (37.2 C), height 5\' 6"  (1.676 m), weight (!) 331 lb (150.1 kg), last menstrual period 12/01/2023, SpO2 95%. Body mass index is 53.42 kg/m.  General: she is overweight, cooperative and in no acute distress. PSYCH: Has normal mood, affect and thought process.   HEENT: EOMI, sclerae are anicteric. Lungs: Normal breathing effort, no conversational dyspnea. Extremities: Moves * 4 Neurologic: A and O * 3, good insight  DIAGNOSTIC DATA REVIEWED: BMET    Component Value Date/Time   NA 139 10/23/2023 0806   K 4.6 10/23/2023 0806  CL 103 10/23/2023 0806   CO2 23 10/23/2023 0806   GLUCOSE 83 10/23/2023 0806   GLUCOSE 95 12/19/2022 0936   BUN 12 10/23/2023 0806   CREATININE 0.91 10/23/2023 0806   CREATININE 0.72 11/30/2016 0926   CALCIUM 8.5 (L) 10/23/2023 0806   GFRNONAA >60 06/19/2022 0535   GFRNONAA >89 11/30/2016 0926   GFRAA 96 12/08/2018 1624   GFRAA >89 11/30/2016 0926   Lab Results  Component Value Date   HGBA1C 5.7 (H) 10/23/2023   HGBA1C 5.5 01/13/2014   Lab Results  Component Value Date   INSULIN 14.0 10/23/2023   INSULIN 12.4 03/12/2023   Lab Results  Component Value Date   TSH 1.660 03/12/2023   CBC    Component Value Date/Time   WBC 5.6 03/12/2023 1033   WBC 5.3 12/19/2022 0936   RBC 4.72 03/12/2023 1033   RBC 4.43 12/19/2022 0936   HGB 12.7 03/12/2023 1033   HCT 39.1 03/12/2023 1033   PLT 334 03/12/2023  1033   MCV 83 03/12/2023 1033   MCH 26.9 03/12/2023 1033   MCH 26.3 06/19/2022 0535   MCHC 32.5 03/12/2023 1033   MCHC 32.8 12/19/2022 0936   RDW 14.5 03/12/2023 1033   Iron Studies    Component Value Date/Time   IRON 44 12/19/2022 0936   TIBC 372 12/19/2022 0936   FERRITIN 5 (L) 12/19/2022 0936   IRONPCTSAT 12 (L) 12/19/2022 0936   Lipid Panel     Component Value Date/Time   CHOL 123 03/12/2023 1033   TRIG 46 03/12/2023 1033   HDL 50 03/12/2023 1033   CHOLHDL 2 12/19/2022 0936   VLDL 7.8 12/19/2022 0936   LDLCALC 62 03/12/2023 1033   Hepatic Function Panel     Component Value Date/Time   PROT 7.2 10/23/2023 0806   ALBUMIN 3.7 (L) 10/23/2023 0806   AST 18 10/23/2023 0806   ALT 22 10/23/2023 0806   ALKPHOS 90 10/23/2023 0806   BILITOT 0.4 10/23/2023 0806      Component Value Date/Time   TSH 1.660 03/12/2023 1033   Nutritional Lab Results  Component Value Date   VD25OH 52.5 10/23/2023   VD25OH 57.1 07/01/2023   VD25OH 19.2 (L) 03/12/2023    Attestations:   Burnett Sheng, acting as a medical scribe for Thomasene Lot, DO., have compiled all relevant documentation for today's office visit on behalf of Thomasene Lot, DO, while in the presence of [employer], DO.  Reviewed by clinician on day of visit: allergies, medications, problem list, medical history, surgical history, family history, social history, and previous encounter notes pertinent to patient's obesity diagnosis.    I have reviewed the above documentation for accuracy and completeness, and I agree with the above. [name], D.O.  The 21st Century Cures Act was signed into law in 2016 which includes the topic of electronic health records.  This provides immediate access to information in MyChart.  This includes consultation notes, operative notes, office notes, lab results and pathology reports.  If you have any questions about what you read please let us know at your next visit so we  can discuss your concerns and take corrective action if need be.  We are right here with you.

## 2023-12-18 ENCOUNTER — Ambulatory Visit: Payer: 59 | Admitting: Nurse Practitioner

## 2023-12-23 ENCOUNTER — Encounter: Payer: Self-pay | Admitting: Physician Assistant

## 2023-12-23 ENCOUNTER — Ambulatory Visit (INDEPENDENT_AMBULATORY_CARE_PROVIDER_SITE_OTHER): Payer: 59 | Admitting: Physician Assistant

## 2023-12-23 VITALS — BP 130/82 | HR 97 | Temp 97.3°F | Ht 66.0 in | Wt 335.6 lb

## 2023-12-23 DIAGNOSIS — D508 Other iron deficiency anemias: Secondary | ICD-10-CM | POA: Diagnosis not present

## 2023-12-23 DIAGNOSIS — M797 Fibromyalgia: Secondary | ICD-10-CM | POA: Diagnosis not present

## 2023-12-23 DIAGNOSIS — R002 Palpitations: Secondary | ICD-10-CM

## 2023-12-23 DIAGNOSIS — M170 Bilateral primary osteoarthritis of knee: Secondary | ICD-10-CM | POA: Diagnosis not present

## 2023-12-23 DIAGNOSIS — I10 Essential (primary) hypertension: Secondary | ICD-10-CM | POA: Diagnosis not present

## 2023-12-23 DIAGNOSIS — Z1211 Encounter for screening for malignant neoplasm of colon: Secondary | ICD-10-CM

## 2023-12-23 DIAGNOSIS — G4709 Other insomnia: Secondary | ICD-10-CM

## 2023-12-23 DIAGNOSIS — G43809 Other migraine, not intractable, without status migrainosus: Secondary | ICD-10-CM

## 2023-12-23 MED ORDER — DULOXETINE HCL 60 MG PO CPEP: 60.0000 mg | ORAL_CAPSULE | Freq: Every day | ORAL | 3 refills | Status: DC

## 2023-12-23 MED ORDER — FERROUS SULFATE 325 (65 FE) MG PO TABS: 325.0000 mg | ORAL_TABLET | ORAL | 3 refills | Status: DC

## 2023-12-23 MED ORDER — AMLODIPINE BESYLATE 5 MG PO TABS
5.0000 mg | ORAL_TABLET | Freq: Every day | ORAL | 3 refills | Status: DC
Start: 2023-12-23 — End: 2024-06-24

## 2023-12-23 MED ORDER — CYCLOBENZAPRINE HCL 5 MG PO TABS
5.0000 mg | ORAL_TABLET | Freq: Three times a day (TID) | ORAL | 0 refills | Status: DC
Start: 2023-12-23 — End: 2024-05-11

## 2023-12-23 MED ORDER — GABAPENTIN 300 MG PO CAPS: 300.0000 mg | ORAL_CAPSULE | Freq: Three times a day (TID) | ORAL | 0 refills | Status: DC

## 2023-12-23 MED ORDER — TRAZODONE HCL 100 MG PO TABS: 100.0000 mg | ORAL_TABLET | Freq: Every evening | ORAL | 3 refills | Status: DC | PRN

## 2023-12-23 MED ORDER — SUMATRIPTAN SUCCINATE 50 MG PO TABS: ORAL_TABLET | ORAL | 4 refills | Status: DC

## 2023-12-23 MED ORDER — TOPIRAMATE 100 MG PO TABS: ORAL_TABLET | ORAL | 0 refills | Status: DC

## 2023-12-23 MED ORDER — DICLOFENAC SODIUM 75 MG PO TBEC: 75.0000 mg | DELAYED_RELEASE_TABLET | Freq: Two times a day (BID) | ORAL | 0 refills | Status: DC | PRN

## 2023-12-23 NOTE — Patient Instructions (Signed)
  YOUR PLAN:  -WEIGHT MANAGEMENT: You have achieved significant weight loss through lifestyle changes and participation in a weight management program. Continue with your current regimen and follow up with Dr. Val Eagle and Jae Dire at [facility].  -FIBROMYALGIA: Fibromyalgia is a condition characterized by widespread musculoskeletal pain. Your symptoms are stable with your current medications. Continue taking Flexeril 5mg  up to 3 times daily as needed, Diclofenac 75mg  twice daily as needed, Cymbalta 60mg  daily, and Gabapentin 300mg  three times daily.  -MIGRAINES: Migraines are severe headaches often accompanied by nausea and sensitivity to light. Your migraines are well controlled with your current medication. Continue taking Topamax 100mg , 1.5 tablets twice daily.  -SLEEP ISSUES: Your sleep issues are being managed with Trazodone. Continue taking Trazodone 100mg  at bedtime.  -IRON DEFICIENCY ANEMIA: Iron deficiency anemia is a condition where your blood lacks enough healthy red blood cells due to insufficient iron. Continue taking Ferrous Sulfate 325mg  every other day.  -OSTEOARTHRITIS OF THE KNEES: Osteoarthritis is a type of arthritis that occurs when flexible tissue at the ends of bones wears down. You are experiencing discomfort and are dissatisfied with your current orthopedic care. A referral to [facility] has been made for further evaluation and management.  -HYPERTENSION: Hypertension is high blood pressure. Your condition is controlled with your current medication. Continue taking Amlodipine 5mg  daily.  -GENERAL HEALTH MAINTENANCE: For your general health, you should schedule a Pap smear and a colon cancer screening. Plan to follow up in 6 months or sooner if needed.  INSTRUCTIONS:  Please schedule a Pap smear and a colon cancer screening. Follow up with Dr. Val Eagle and Jae Dire at [facility]. A referral to [facility] has been made for your knee osteoarthritis. Plan to follow  up in 6 months or sooner if needed.

## 2023-12-23 NOTE — Progress Notes (Signed)
Patient ID: Cynthia [name], female    DOB: May 29, 1981, 43 y.o.   MRN: [id number]   Assessment & Plan:  Bilateral primary osteoarthritis of knee -     Ambulatory referral to Orthopedic Surgery  Primary hypertension -     amLODIPine Besylate; Take 1 tablet (5 mg total) by mouth daily.  Dispense: 90 tablet; Refill: 3  Fibromyalgia -     Cyclobenzaprine HCl; Take 1 tablet (5 mg total) by mouth 3 (three) times daily. Take for muscle spasms.  Dispense: 90 tablet; Refill: 0 -     Diclofenac Sodium; Take 1 tablet (75 mg total) by mouth 2 (two) times daily as needed.  Dispense: 60 tablet; Refill: 0 -     DULoxetine HCl; Take 1 capsule (60 mg total) by mouth daily.  Dispense: 90 capsule; Refill: 3 -     Gabapentin; Take 1 capsule (300 mg total) by mouth 3 (three) times daily.  Dispense: 270 capsule; Refill: 0  Iron deficiency anemia due to dietary causes -     Ferrous Sulfate; Take 1 tablet (325 mg total) by mouth every other day.  Dispense: 30 tablet; Refill: 3  Other migraine without status migrainosus, not intractable -     SUMAtriptan Succinate; Take 1 tab (50mg ) orally at the onset of her migraine, may repeat in 2 hours if migraine persists.  Total daily maximum dose is 200 mg  Dispense: 10 tablet; Refill: 4 -     Topiramate; TAKE 1 AND 1/2 TABLETS BY  MOUTH 2 TIMES DAILY. FOR  MIGRAINE PREVENTION  Dispense: 270 tablet; Refill: 0  Other insomnia -     traZODone HCl; Take 1 tablet (100 mg total) by mouth at bedtime as needed. for sleep  Dispense: 90 tablet; Refill: 3  Screening for colon cancer -     Ambulatory referral to Gastroenterology  Palpitations    Assessment and Plan    Weight Management Significant weight loss achieved through lifestyle modifications and participation in a weight management program. -Continue current regimen and follow-up with weight management program.  Fibromyalgia Stable with current regimen. -Continue Flexeril 5mg  up to 3 times daily as needed,  Diclofenac 75mg  twice daily as needed, Cymbalta 60mg  daily, and Gabapentin 300mg  three times daily.  Migraines Well controlled on current regimen. -Continue Topamax 150mg  twice daily.  Sleep Disorder Managed with Trazodone. -Continue Trazodone 100mg  at bedtime.  Iron Deficiency Anemia On iron supplementation. -Continue Ferrous Sulfate 325mg  every other day.  Osteoarthritis of the Knees Patient reports dissatisfaction with current orthopedic care. -Referral to [facility] for further management.  Colon Cancer Screening Due for colonoscopy. -Sent referral for colonoscopy.  Cervical Cancer Screening Due for Pap smear. -Schedule Pap smear at our office.  Hypertension Controlled on current regimen. -Continue Amlodipine 5mg  daily.  Premature Ventricular Contractions Patient reports no recent episodes. -Discontinue Metoprolol and monitor for recurrence of symptoms.          No follow-ups on file.    Subjective:    Chief Complaint  Patient presents with   Medical Management of Chronic Issues    Pt in office for BP follow up; discuss orthopedic referral; also needing meds refilled; pt needs to discuss Metoprolol; [facility] asked that PCP takes over other meds;     HPI   History of Present Illness   The patient, a 43 year old with a history of fibromyalgia and prediabetes, presents for a routine follow-up. She reports significant progress in her health, including a 40-pound weight loss achieved  through a combination of dietary changes and regular swimming. Despite these improvements, she continues to experience arthritis-related knee pain, which she feels is hindering her physical activity and overall quality of life. She expresses dissatisfaction with her current orthopedic care and requests a referral to a different provider.  The patient also discusses her prediabetic status, which is being managed with metformin. She undergoes regular blood work every three months to  monitor her condition. She also mentions a history of iron deficiency anemia, for which she takes ferrous sulfate every other day.  In terms of her fibromyalgia, the patient reports that her symptoms are well-controlled with her current medication regimen, which includes Flexeril, diclofenac, Cymbalta, and gabapentin. She also takes Topamax for migraine prevention and trazodone for sleep issues.  The patient also mentions some changes in her body, including hair growth in unusual places, which she suspects may be due to hormonal imbalances or premenopause. She is also dealing with night sweats.  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 She has made significant changes to her work-life balance, including learning to say no to certain bookings and outsourcing help for events.       Past Medical History:  Diagnosis Date   Allergy 11/20   Anxiety    Arthritis    Asthma    childhood   Asthma    Bilateral swelling of feet    Constipation    Depression 10/22/21   Fatigue    Fibromyalgia 2007   High blood pressure    Hypertension    on and off since 2007   Joint pain    Multiple food allergies    Osteoarthritis    Osteoarthritis    Palpitations    Prediabetes    Seasonal allergies    SOB (shortness of breath)    Vitamin D deficiency     History reviewed. No pertinent surgical history.  Family History  Problem Relation Age of Onset   Arthritis Mother    Hypertension Mother    Obesity Mother    Heart disease Father    Hyperlipidemia Father    Alcohol abuse Father    Arthritis Father    Asthma Father    COPD Father    Hypertension Father    Heart failure Father    Liver disease Father    Alcoholism Father    Early death Maternal Grandmother    Hypertension Maternal Grandmother    Pneumonia Maternal Grandmother    Hearing loss Maternal Grandfather    Hypertension  Maternal Grandfather    Stroke Maternal Grandfather    Vision loss Maternal Grandfather    Hypertension Paternal Grandmother    Heart disease Paternal Grandmother    Hypertension Paternal Grandfather    Heart failure Paternal Grandfather    Vision loss Paternal Grandfather     Social History   Tobacco Use   Smoking status: Never   Smokeless tobacco: Never  Vaping Use   Vaping status: Never Used  Substance Use Topics   Alcohol use: No   Drug use: No     Allergies  Allergen Reactions   Compazine [Prochlorperazine] Itching and Anxiety   Beef-Derived Drug Products Other (See Comments)   Justicia Adhatoda (Malabar Nut Tree) [Justicia Adhatoda]     Tree nuts   Pork-Derived Products Other (See Comments)   Shellfish Allergy Rash    Review of Systems NEGATIVE UNLESS OTHERWISE  INDICATED IN HPI      Objective:     BP 130/82 (BP Location: Left Arm, Patient Position: Sitting)   Pulse 97   Temp (!) 97.3 F (36.3 C) (Temporal)   Ht 5\' 6"  (1.676 m)   Wt (!) 335 lb 9.6 oz (152.2 kg)   LMP 12/01/2023 (Exact Date)   SpO2 98%   BMI 54.17 kg/m   Wt Readings from Last 3 Encounters:  12/23/23 (!) 335 lb 9.6 oz (152.2 kg)  12/16/23 (!) 331 lb (150.1 kg)  10/23/23 (!) 333 lb (151 kg)    BP Readings from Last 3 Encounters:  12/23/23 130/82  12/16/23 (!) 145/86  10/23/23 (!) 154/81     Physical Exam Vitals and nursing note reviewed.  Constitutional:      Appearance: Normal appearance. She is obese. She is not toxic-appearing.  HENT:     Head: Normocephalic and atraumatic.     Right Ear: Tympanic membrane, ear canal and external ear normal.     Left Ear: Tympanic membrane, ear canal and external ear normal.     Nose: Nose normal.     Mouth/Throat:     Mouth: Mucous membranes are moist.  Eyes:     Extraocular Movements: Extraocular movements intact.     Conjunctiva/sclera: Conjunctivae normal.     Pupils: Pupils are equal, round, and reactive to light.   Cardiovascular:     Rate and Rhythm: Normal rate and regular rhythm.     Pulses: Normal pulses.     Heart sounds: Normal heart sounds.  Pulmonary:     Effort: Pulmonary effort is normal.     Breath sounds: Normal breath sounds.  Abdominal:     General: Abdomen is flat. Bowel sounds are normal.     Palpations: Abdomen is soft.  Musculoskeletal:        General: Normal range of motion.     Cervical back: Normal range of motion and neck supple.  Skin:    General: Skin is warm and dry.  Neurological:     General: No focal deficit present.     Mental Status: She is alert and oriented to person, place, and time.  Psychiatric:        Mood and Affect: Mood normal.        Behavior: Behavior normal.        Thought Content: Thought content normal.        Judgment: Judgment normal.      Cynthia [name] M Aikam Vinje, PA-C

## 2024-01-06 ENCOUNTER — Ambulatory Visit (INDEPENDENT_AMBULATORY_CARE_PROVIDER_SITE_OTHER): Payer: 59 | Admitting: Adult Health

## 2024-01-20 ENCOUNTER — Ambulatory Visit (INDEPENDENT_AMBULATORY_CARE_PROVIDER_SITE_OTHER): Payer: 59 | Admitting: Family Medicine

## 2024-02-03 DIAGNOSIS — M25561 Pain in right knee: Secondary | ICD-10-CM | POA: Insufficient documentation

## 2024-02-03 DIAGNOSIS — M25562 Pain in left knee: Secondary | ICD-10-CM | POA: Insufficient documentation

## 2024-02-13 ENCOUNTER — Encounter: Payer: Self-pay | Admitting: Physician Assistant

## 2024-02-17 ENCOUNTER — Encounter (INDEPENDENT_AMBULATORY_CARE_PROVIDER_SITE_OTHER): Payer: Self-pay | Admitting: Adult Health

## 2024-02-17 ENCOUNTER — Other Ambulatory Visit: Payer: Self-pay | Admitting: Physician Assistant

## 2024-02-17 ENCOUNTER — Ambulatory Visit (INDEPENDENT_AMBULATORY_CARE_PROVIDER_SITE_OTHER): Payer: 59 | Admitting: Adult Health

## 2024-02-17 VITALS — BP 128/84 | HR 93 | Temp 98.3°F | Ht 66.0 in | Wt 332.0 lb

## 2024-02-17 DIAGNOSIS — I10 Essential (primary) hypertension: Secondary | ICD-10-CM

## 2024-02-17 DIAGNOSIS — M797 Fibromyalgia: Secondary | ICD-10-CM

## 2024-02-17 DIAGNOSIS — R7303 Prediabetes: Secondary | ICD-10-CM | POA: Diagnosis not present

## 2024-02-17 DIAGNOSIS — E538 Deficiency of other specified B group vitamins: Secondary | ICD-10-CM | POA: Diagnosis not present

## 2024-02-17 DIAGNOSIS — Z6841 Body Mass Index (BMI) 40.0 and over, adult: Secondary | ICD-10-CM

## 2024-02-17 DIAGNOSIS — E669 Obesity, unspecified: Secondary | ICD-10-CM

## 2024-02-17 DIAGNOSIS — E559 Vitamin D deficiency, unspecified: Secondary | ICD-10-CM

## 2024-02-17 MED ORDER — VITAMIN D (ERGOCALCIFEROL) 1.25 MG (50000 UNIT) PO CAPS
ORAL_CAPSULE | ORAL | 0 refills | Status: DC
Start: 2024-02-17 — End: 2024-04-06

## 2024-02-17 MED ORDER — IRBESARTAN-HYDROCHLOROTHIAZIDE 150-12.5 MG PO TABS: ORAL_TABLET | ORAL | 0 refills | Status: DC

## 2024-02-17 MED ORDER — METFORMIN HCL 500 MG PO TABS: ORAL_TABLET | ORAL | 0 refills | Status: DC

## 2024-02-17 NOTE — Progress Notes (Signed)
 WEIGHT SUMMARY AND BIOMETRICS  Vitals Temp: 98.3 F (36.8 C) BP: 128/84 Pulse Rate: 93 SpO2: 94 %   Anthropometric Measurements Height: 5\' 6"  (1.676 m) Weight: (!) 332 lb (150.6 kg) BMI (Calculated): 53.61 Weight at Last Visit: 335 lb Weight Lost Since Last Visit: 3 lb Weight Gained Since Last Visit: 0 Starting Weight: 353 lb Total Weight Loss (lbs): 25 lb (11.3 kg)   Body Composition  Body Fat %: 55.1 % Fat Mass (lbs): 183.2 lbs Muscle Mass (lbs): 141.8 lbs Total Body Water (lbs): 110 lbs Visceral Fat Rating : 21   Other Clinical Data Fasting: yes Labs: no Today's Visit #: 12 Starting Date: 03/12/23    Chief Complaint:   OBESITY Cynthia [name] is here to discuss her progress with her obesity treatment plan.  She is on the the Category 3 Plan and states she is following her eating plan approximately 90 % of the time.  She states she is exercising Walking 60-90 minutes 3 times per week.   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 She estimates to walk 10-15K steps and lifting 500 lbs - 1500 lbs of clothes per shift. She works Tuesday- Sat She also owns/operates event/catering business- very strenuous. She lives with her mother (age 45), husband, and 2 children (age 41 girl and age 69 boy).  She has been experiencing nasal congestion (clear drainage), productive cough (clear mucus), fatigue, since 3/282025. She experienced a fever (highest 101 F) last Friday, currently afebrile Home COVID-19 test: negative  Subjective:   1. Vitamin B12 deficiency At last OV at [facility] on 12/16/2023 B12 increased from to daily She endorses fatigue the last 4 days, r/t to sinusitis   2. Primary hypertension BP at goal at OV She is currently on metoprolol succinate (TOPROL-XL) 25 MG 24 hr tablet  amLODipine (NORVASC) 5 MG tablet  irbesartan-hydrochlorothiazide (AVALIDE) 150-12.5 MG  tablet   3. Vitamin D deficiency At last OV at [facility] on 12/16/2023 Ergocalciferol was increased from weekly to bi-weekly She endorses fatigue the last 4 days, r/t to sinusitis   4. Fibromyalgia Continue prescribed Cat 3 MP and remain active (as tolerated) She endorses fatigue the last 4 days, r/t to sinusitis  She reports daily diffuse pain, rated 6/10 When she experienced Fibromyalgia excerebration, diffuse pain will increase to 10/10  5. Prediabetes At last OV at [facility] on 12/16/2023 Metformin increased to 1000 mg BID (with lunch and dinner)  Shew denies GI upset with increase. She is frustrated with weight loss plateau, in the 330s since August 2024  Assessment/Plan:   1. Vitamin B12 deficiency Continue increased oral dose of B12 supplementation  2. Primary hypertension (Primary) Refill - irbesartan-hydrochlorothiazide (AVALIDE) 150-12.5 MG tablet; 1 TAB PO Q AM  Dispense: 30 tablet; Refill: 0 Continue prescribed Cat 3 MP and remain active (as tolerated)  3. Vitamin D deficiency Refill - Vitamin D, Ergocalciferol, (DRISDOL) 1.25 MG (50000 UNIT) CAPS capsule; 1 po q Wed and 1 po q Sun  Dispense: 24 capsule; Refill: 0  4. Fibromyalgia Continue prescribed Cat 3 MP and remain active (as tolerated)  5. Prediabetes Refill - metFORMIN (GLUCOPHAGE) 500 MG tablet; 2 po with lunch and 2 dinner daily  Dispense: 120 tablet; Refill: 0 Continue prescribed Cat 3 MP and remain active (as tolerated)  Evaluate hunger levels at next OV   6. BMI 50.0-59.9, adult (HCC)- Current BMI 53.7  Cynthia [name]  is currently in the action stage of change. As such, her goal is to continue with weight loss efforts. She has agreed to the Category 3 Plan.   Exercise goals: All adults should avoid inactivity. Some physical activity is better than none, and adults who participate in any amount of physical activity gain some health benefits. Adults should also include muscle-strengthening activities that involve all  major muscle groups on 2 or more days a week.  Behavioral modification strategies: increasing lean protein intake, decreasing simple carbohydrates, increasing vegetables, increasing water intake, no skipping meals, meal planning and cooking strategies, keeping healthy foods in the home, and planning for success.  Cynthia [name] has agreed to follow-up with our clinic in 4 weeks. She was informed of the importance of frequent follow-up visits to maximize her success with intensive lifestyle modifications for her multiple health conditions.   Objective:   Blood pressure 128/84, pulse 93, temperature 98.3 F (36.8 C), height 5\' 6"  (1.676 m), weight (!) 332 lb (150.6 kg), SpO2 94%. Body mass index is 53.59 kg/m.  General: Cooperative, alert, well developed, in no acute distress. HEENT: Conjunctivae and lids unremarkable. Cardiovascular: Regular rhythm.  Lungs: Normal work of breathing. Neurologic: No focal deficits.   Lab Results  Component Value Date   CREATININE 0.91 10/23/2023   BUN 12 10/23/2023   NA 139 10/23/2023   K 4.6 10/23/2023   CL 103 10/23/2023   CO2 23 10/23/2023   Lab Results  Component Value Date   ALT 22 10/23/2023   AST 18 10/23/2023   ALKPHOS 90 10/23/2023   BILITOT 0.4 10/23/2023   Lab Results  Component Value Date   HGBA1C 5.7 (H) 10/23/2023   HGBA1C 6.0 (H) 07/01/2023   HGBA1C 5.9 (H) 03/12/2023   HGBA1C 5.6 09/17/2022   HGBA1C 5.5 01/13/2014   Lab Results  Component Value Date   INSULIN 14.0 10/23/2023   INSULIN 13.7 07/01/2023   INSULIN 12.4 03/12/2023   Lab Results  Component Value Date   TSH 1.660 03/12/2023   Lab Results  Component Value Date   CHOL 123 03/12/2023   HDL 50 03/12/2023   LDLCALC 62 03/12/2023   TRIG 46 03/12/2023   CHOLHDL 2 12/19/2022   Lab Results  Component Value Date   VD25OH 52.5 10/23/2023   VD25OH 57.1 07/01/2023   VD25OH 19.2 (L) 03/12/2023   Lab Results  Component Value Date   WBC 5.6 03/12/2023   HGB 12.7  03/12/2023   HCT 39.1 03/12/2023   MCV 83 03/12/2023   PLT 334 03/12/2023   Lab Results  Component Value Date   IRON 44 12/19/2022   TIBC 372 12/19/2022   FERRITIN 5 (L) 12/19/2022   Attestation Statements:   Reviewed by clinician on day of visit: allergies, medications, problem list, medical history, surgical history, family history, social history, and previous encounter notes.  I have reviewed the above documentation for accuracy and completeness, and I agree with the above. -  Kadijah Shamoon d. Reyana Leisey, NP-C

## 2024-03-02 ENCOUNTER — Encounter: Payer: 59 | Admitting: Physician Assistant

## 2024-03-09 ENCOUNTER — Ambulatory Visit (INDEPENDENT_AMBULATORY_CARE_PROVIDER_SITE_OTHER): Payer: 59 | Admitting: Family Medicine

## 2024-03-18 ENCOUNTER — Other Ambulatory Visit: Payer: Self-pay | Admitting: Physician Assistant

## 2024-03-18 DIAGNOSIS — G43809 Other migraine, not intractable, without status migrainosus: Secondary | ICD-10-CM

## 2024-03-18 DIAGNOSIS — M797 Fibromyalgia: Secondary | ICD-10-CM

## 2024-03-26 ENCOUNTER — Other Ambulatory Visit: Payer: Self-pay | Admitting: Physician Assistant

## 2024-03-26 ENCOUNTER — Other Ambulatory Visit (INDEPENDENT_AMBULATORY_CARE_PROVIDER_SITE_OTHER): Payer: Self-pay | Admitting: Adult Health

## 2024-03-26 DIAGNOSIS — K5900 Constipation, unspecified: Secondary | ICD-10-CM

## 2024-03-26 DIAGNOSIS — M797 Fibromyalgia: Secondary | ICD-10-CM

## 2024-03-29 ENCOUNTER — Encounter: Payer: Self-pay | Admitting: Physician Assistant

## 2024-03-30 ENCOUNTER — Encounter: Admitting: Physician Assistant

## 2024-03-30 NOTE — Telephone Encounter (Signed)
 Please see pt msg and advise. Ok to refer to Ortho Walk in or Sports Medicine for evaluation?

## 2024-04-06 ENCOUNTER — Encounter (INDEPENDENT_AMBULATORY_CARE_PROVIDER_SITE_OTHER): Payer: Self-pay | Admitting: Adult Health

## 2024-04-06 ENCOUNTER — Ambulatory Visit (INDEPENDENT_AMBULATORY_CARE_PROVIDER_SITE_OTHER): Admitting: Adult Health

## 2024-04-06 VITALS — BP 118/79 | HR 84 | Temp 98.5°F | Ht 66.0 in | Wt 339.0 lb

## 2024-04-06 DIAGNOSIS — E559 Vitamin D deficiency, unspecified: Secondary | ICD-10-CM

## 2024-04-06 DIAGNOSIS — I10 Essential (primary) hypertension: Secondary | ICD-10-CM | POA: Diagnosis not present

## 2024-04-06 DIAGNOSIS — R7303 Prediabetes: Secondary | ICD-10-CM | POA: Diagnosis not present

## 2024-04-06 DIAGNOSIS — Z6841 Body Mass Index (BMI) 40.0 and over, adult: Secondary | ICD-10-CM

## 2024-04-06 DIAGNOSIS — E538 Deficiency of other specified B group vitamins: Secondary | ICD-10-CM

## 2024-04-06 MED ORDER — TIRZEPATIDE-WEIGHT MANAGEMENT 2.5 MG/0.5ML ~~LOC~~ SOLN: 2.5000 mg | SUBCUTANEOUS | 0 refills | Status: DC

## 2024-04-06 MED ORDER — METFORMIN HCL 500 MG PO TABS: ORAL_TABLET | ORAL | 0 refills | Status: DC

## 2024-04-06 MED ORDER — IRBESARTAN-HYDROCHLOROTHIAZIDE 150-12.5 MG PO TABS: ORAL_TABLET | ORAL | 0 refills | Status: AC

## 2024-04-06 MED ORDER — VITAMIN D (ERGOCALCIFEROL) 1.25 MG (50000 UNIT) PO CAPS: ORAL_CAPSULE | ORAL | 0 refills | Status: DC

## 2024-04-06 NOTE — Progress Notes (Signed)
 WEIGHT SUMMARY AND BIOMETRICS  Vitals Temp: 98.5 F (36.9 C) BP: 118/79 Pulse Rate: 84 SpO2: 99 %   Anthropometric Measurements Height: 5\' 6"  (1.676 m) Weight: (!) 339 lb (153.8 kg) BMI (Calculated): 54.74 Weight at Last Visit: 332 lb Weight Lost Since Last Visit: 0 Weight Gained Since Last Visit: 7 lb Starting Weight: 353 lb Total Weight Loss (lbs): 14 lb (6.35 kg) Peak Weight: 383 lb   Body Composition  Body Fat %: 56.7 % Fat Mass (lbs): 192.4 lbs Muscle Mass (lbs): 139.6 lbs Visceral Fat Rating : 22   Other Clinical Data Fasting: yes Labs: no Today's Visit #: 13 Starting Date: 03/12/23    Chief Complaint:   OBESITY Cynthia [name] is here to discuss her progress with her obesity treatment plan.  She is on the the Category 3 Plan and states she is following her eating plan approximately 80 % of the time.  She states she is exercising : NEAT Activities   Interim History:  She suffered at fall at work late April 2025 Her PCP advised her to be seen at local [facility] She was examined, no imaging completed. She has f/u with [facility] this Thursday -04/09/2024 She anticipates imaging and injection therapy at that OV  Of Note- She loves with her mother and her husband. Her step children are currently in [place]  with their mother- they will return later in summer.  She is interested in GLP-1, GIP/GLP-1 therapy- her insurance will not cover for obesity tx  Subjective:   1. Prediabetes She is on Metformin  500mg  2 tabs at lunch and 2 tabs at dinner She endorses breakthrough polyphagia late afternoon and late evening. She denies family hx of MENS 2 or MTC She denies personal hx of pancreatitis Birth Control- peri menopausal and she is abstinent   2. Primary hypertension BP recheck is at goal She denies CP with exertion She denies tobacco/vape use PCP manages daily Norvasc  5mg  HW[facility] manages daily Avalide 150/12.5mg   3. Vitamin D   deficiency  Latest Reference Range & Units 03/12/23 10:33 07/01/23 09:54 10/23/23 08:06  Vitamin D , 25-Hydroxy 30.0 - 100.0 ng/mL 19.2 (L) 57.1 52.5  (L): Data is abnormally low  She is on Ergocalciferol - 1 tab Wed and 1 tab Sun She denies N/V/Muscle Weakness  5. Vitamin B12 deficiency  Latest Reference Range & Units 03/12/23 10:33 07/01/23 09:54 10/23/23 08:06  Vitamin B12 232 - 1,245 pg/mL 298 477 351   She is taking oral B12 supplementation: 2 tabs 500mcg daily  Assessment/Plan:   1. Prediabetes Refill - metFORMIN  (GLUCOPHAGE ) 500 MG tablet; 2 po with lunch and 2 dinner daily  Dispense: 120 tablet; Refill: 0 Check Labs - Hemoglobin A1c - Insulin , random - Magnesium  2. Primary hypertension Refill - irbesartan -hydrochlorothiazide  (AVALIDE) 150-12.5 MG tablet; 1 TAB PO Q AM  Dispense: 30 tablet; Refill: 0 Check Labs - Comprehensive metabolic panel with GFR  3. Vitamin D  deficiency Refill - Vitamin D , Ergocalciferol , (DRISDOL ) 1.25 MG (50000 UNIT) CAPS capsule; 1 po q Wed and 1 po q Sun  Dispense: 24 capsule; Refill: 0 Check Labs - VITAMIN D  25 Hydroxy (Vit-D Deficiency, Fractures)  5. Vitamin B12 deficiency Check Labs - Vitamin B12  4. BMI 50.0-59.9, adult (HCC)- Current BMI 54.8 (Primary) Start  tirzepatide (ZEPBOUND) 2.5 MG/0.5ML injection vial Inject 2.5 mg into the skin once a week. Dispense: 2 mL, Refills: 0 ordered   Warden/ranger Information provided to pt  Cynthia [name] is currently in the  action stage of change. As such, her goal is to continue with weight loss efforts. She has agreed to the Category 3 Plan.   Exercise goals: All adults should avoid inactivity. Some physical activity is better than none, and adults who participate in any amount of physical activity gain some health benefits. Adults should also include muscle-strengthening activities that involve all major muscle groups on 2 or more days a week.  Behavioral modification strategies:  increasing lean protein intake, decreasing simple carbohydrates, increasing vegetables, increasing water intake, meal planning and cooking strategies, keeping healthy foods in the home, ways to avoid boredom eating, holiday eating strategies , and planning for success.  Cynthia [name] has agreed to follow-up with our clinic in 4 weeks. She was informed of the importance of frequent follow-up visits to maximize her success with intensive lifestyle modifications for her multiple health conditions.   Cynthia [name] was informed we would discuss her lab results at her next visit unless there is a critical issue that needs to be addressed sooner. Cynthia [name] agreed to keep her next visit at the agreed upon time to discuss these results.  Objective:   Blood pressure 118/79, pulse 84, temperature 98.5 F (36.9 C), height 5\' 6"  (1.676 m), weight (!) 339 lb (153.8 kg), SpO2 99%. Body mass index is 54.72 kg/m.  General: Cooperative, alert, well developed, in no acute distress. HEENT: Conjunctivae and lids unremarkable. Cardiovascular: Regular rhythm.  Lungs: Normal work of breathing. Neurologic: No focal deficits.   Lab Results  Component Value Date   CREATININE 0.91 10/23/2023   BUN 12 10/23/2023   NA 139 10/23/2023   K 4.6 10/23/2023   CL 103 10/23/2023   CO2 23 10/23/2023   Lab Results  Component Value Date   ALT 22 10/23/2023   AST 18 10/23/2023   ALKPHOS 90 10/23/2023   BILITOT 0.4 10/23/2023   Lab Results  Component Value Date   HGBA1C 5.7 (H) 10/23/2023   HGBA1C 6.0 (H) 07/01/2023   HGBA1C 5.9 (H) 03/12/2023   HGBA1C 5.6 09/17/2022   HGBA1C 5.5 01/13/2014   Lab Results  Component Value Date   INSULIN  14.0 10/23/2023   INSULIN  13.7 07/01/2023   INSULIN  12.4 03/12/2023   Lab Results  Component Value Date   TSH 1.660 03/12/2023   Lab Results  Component Value Date   CHOL 123 03/12/2023   HDL 50 03/12/2023   LDLCALC 62 03/12/2023   TRIG 46 03/12/2023   CHOLHDL 2 12/19/2022   Lab  Results  Component Value Date   VD25OH 52.5 10/23/2023   VD25OH 57.1 07/01/2023   VD25OH 19.2 (L) 03/12/2023   Lab Results  Component Value Date   WBC 5.6 03/12/2023   HGB 12.7 03/12/2023   HCT 39.1 03/12/2023   MCV 83 03/12/2023   PLT 334 03/12/2023   Lab Results  Component Value Date   IRON 44 12/19/2022   TIBC 372 12/19/2022   FERRITIN 5 (L) 12/19/2022   Attestation Statements:   Reviewed by clinician on day of visit: allergies, medications, problem list, medical history, surgical history, family history, social history, and previous encounter notes.  I have reviewed the above documentation for accuracy and completeness, and I agree with the above. -  Chiyeko Ferre d. Safiyah Cisney, NP-C

## 2024-04-07 ENCOUNTER — Telehealth (INDEPENDENT_AMBULATORY_CARE_PROVIDER_SITE_OTHER): Payer: Self-pay

## 2024-04-07 LAB — COMPREHENSIVE METABOLIC PANEL WITH GFR
ALT: 20 IU/L (ref 0–32)
AST: 19 IU/L (ref 0–40)
Albumin: 3.8 g/dL — ABNORMAL LOW (ref 3.9–4.9)
Alkaline Phosphatase: 85 IU/L (ref 44–121)
BUN/Creatinine Ratio: 22 (ref 9–23)
BUN: 18 mg/dL (ref 6–24)
Bilirubin Total: 0.2 mg/dL (ref 0.0–1.2)
CO2: 20 mmol/L (ref 20–29)
Calcium: 8.8 mg/dL (ref 8.7–10.2)
Chloride: 103 mmol/L (ref 96–106)
Creatinine, Ser: 0.83 mg/dL (ref 0.57–1.00)
Globulin, Total: 3.6 g/dL (ref 1.5–4.5)
Glucose: 77 mg/dL (ref 70–99)
Potassium: 4.1 mmol/L (ref 3.5–5.2)
Sodium: 137 mmol/L (ref 134–144)
Total Protein: 7.4 g/dL (ref 6.0–8.5)
eGFR: 89 mL/min/{1.73_m2} (ref >59)

## 2024-04-07 LAB — VITAMIN D 25 HYDROXY (VIT D DEFICIENCY, FRACTURES): Vit D, 25-Hydroxy: 51.3 ng/mL (ref 30.0–100.0)

## 2024-04-07 LAB — INSULIN, RANDOM: INSULIN: 15.3 u[IU]/mL (ref 2.6–24.9)

## 2024-04-07 LAB — MAGNESIUM: Magnesium: 2 mg/dL (ref 1.6–2.3)

## 2024-04-07 LAB — HEMOGLOBIN A1C
Est. average glucose Bld gHb Est-mCnc: 105 mg/dL
Hgb A1c MFr Bld: 5.3 % (ref 4.8–5.6)

## 2024-04-07 LAB — VITAMIN B12: Vitamin B-12: 377 pg/mL (ref 232–1245)

## 2024-04-07 NOTE — Telephone Encounter (Signed)
 PA for Zepbound  2.5 has been denied. PA is now complete.

## 2024-04-07 NOTE — Telephone Encounter (Signed)
 PA for Zepbound 2.5 has been submitted, awaiting PA questions.

## 2024-04-07 NOTE — Telephone Encounter (Signed)
 PA questions for Zepbound 2.5  have been answered and all documentation has been included. Waiting on a determination.

## 2024-04-27 ENCOUNTER — Encounter: Admitting: Physician Assistant

## 2024-05-11 ENCOUNTER — Other Ambulatory Visit (INDEPENDENT_AMBULATORY_CARE_PROVIDER_SITE_OTHER): Payer: Self-pay | Admitting: Family Medicine

## 2024-05-11 ENCOUNTER — Other Ambulatory Visit: Payer: Self-pay | Admitting: Physician Assistant

## 2024-05-11 DIAGNOSIS — K5900 Constipation, unspecified: Secondary | ICD-10-CM

## 2024-05-11 DIAGNOSIS — M797 Fibromyalgia: Secondary | ICD-10-CM

## 2024-05-12 ENCOUNTER — Ambulatory Visit (INDEPENDENT_AMBULATORY_CARE_PROVIDER_SITE_OTHER): Admitting: Podiatry

## 2024-05-12 DIAGNOSIS — Z91199 Patient's noncompliance with other medical treatment and regimen due to unspecified reason: Secondary | ICD-10-CM

## 2024-05-12 NOTE — Progress Notes (Signed)
 No show

## 2024-05-18 ENCOUNTER — Ambulatory Visit (INDEPENDENT_AMBULATORY_CARE_PROVIDER_SITE_OTHER): Admitting: Family Medicine

## 2024-06-01 ENCOUNTER — Encounter (INDEPENDENT_AMBULATORY_CARE_PROVIDER_SITE_OTHER): Payer: Self-pay | Admitting: Physician Assistant

## 2024-06-01 ENCOUNTER — Ambulatory Visit (INDEPENDENT_AMBULATORY_CARE_PROVIDER_SITE_OTHER): Admitting: Physician Assistant

## 2024-06-01 VITALS — BP 144/75 | HR 87 | Temp 97.9°F | Ht 66.0 in | Wt 337.0 lb

## 2024-06-01 DIAGNOSIS — E559 Vitamin D deficiency, unspecified: Secondary | ICD-10-CM | POA: Diagnosis not present

## 2024-06-01 DIAGNOSIS — R7303 Prediabetes: Secondary | ICD-10-CM | POA: Diagnosis not present

## 2024-06-01 DIAGNOSIS — Z6841 Body Mass Index (BMI) 40.0 and over, adult: Secondary | ICD-10-CM

## 2024-06-01 DIAGNOSIS — F32A Depression, unspecified: Secondary | ICD-10-CM

## 2024-06-01 DIAGNOSIS — M170 Bilateral primary osteoarthritis of knee: Secondary | ICD-10-CM

## 2024-06-01 DIAGNOSIS — M797 Fibromyalgia: Secondary | ICD-10-CM | POA: Diagnosis not present

## 2024-06-01 DIAGNOSIS — F419 Anxiety disorder, unspecified: Secondary | ICD-10-CM

## 2024-06-01 DIAGNOSIS — I10 Essential (primary) hypertension: Secondary | ICD-10-CM

## 2024-06-01 DIAGNOSIS — E538 Deficiency of other specified B group vitamins: Secondary | ICD-10-CM

## 2024-06-01 MED ORDER — VITAMIN D (ERGOCALCIFEROL) 1.25 MG (50000 UNIT) PO CAPS: ORAL_CAPSULE | ORAL | 0 refills | Status: DC

## 2024-06-01 MED ORDER — METFORMIN HCL 500 MG PO TABS: ORAL_TABLET | ORAL | 0 refills | Status: DC

## 2024-06-01 MED ORDER — TIRZEPATIDE-WEIGHT MANAGEMENT 2.5 MG/0.5ML ~~LOC~~ SOLN: 2.5000 mg | SUBCUTANEOUS | 0 refills | Status: DC

## 2024-06-01 NOTE — Progress Notes (Unsigned)
 SUBJECTIVE: Discussed the use of AI scribe software for clinical note transcription with the patient, who gave verbal consent to proceed.  Chief Complaint: Obesity  Interim History: She is down 2 lbs since her last visit.  Down 16 lbs overall TBW loss of 4.5%  Cynthia [name] is here to discuss her progress with her obesity treatment plan. She is on the Category 3 Plan and states she is following her eating plan approximately 60 % of the time. She states she is not exercising .  Cynthia [name] is a 43 year old female with obesity who presents for follow-up on her obesity treatment plan.  She is following a category three nutrition plan with approximately 60% adherence and has lost two pounds since her last visit. Her weight loss efforts are challenged by recent health issues and personal stressors. Her knee osteoarthritis, described as 'almost bone on bone,' has impacted her ability to exercise. She reports receiving viscosupplement injections in her knees earlier today and has noticed improvement.  She had pneumonia and strep throat in the last month and a half, which led to hospitalization for about a week due to dehydration and complications from pneumonia. She is feeling much better overall/recovered from these illness but these illnesses have contributed to her feeling unfocused and off track with her weight management plan.  She has fibromyalgia, which causes varying levels of pain and affects her daily functioning and weight management efforts.  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 She has tried to decrease her event business due to her recent health issues to focus more on her health and self care.   Dietary challenges arise from allergies to eggs and dairy, complicating adherence to her nutrition plan. She uses protein shakes and other alternatives to  manage her dietary needs.  Her current medications include metformin  500 mg (two tablets at lunch and dinner daily), Miralax  for constipation, ergocalciferol  50,000 units twice weekly, vitamin B12 1,000 mcg daily, amlodipine  5 mg daily, irbsartan/hydrochlorothiazide  150/12.5 mg daily, and metoprolol  25 mg daily.  She did not start Zepbound  as she was hospitalized and missed the text from the [facility].  She is ready to start Zepbound .   She has a history of constipation managed with Miralax .  OBJECTIVE: Visit Diagnoses: Problem List Items Addressed This Visit     Morbid obesity (HCC)   Relevant Medications   tirzepatide  (ZEPBOUND ) 2.5 MG/0.5ML injection vial   metFORMIN  (GLUCOPHAGE ) 500 MG tablet   Primary hypertension   Vitamin D  deficiency   Relevant Medications   Vitamin D , Ergocalciferol , (DRISDOL ) 1.25 MG (50000 UNIT) CAPS capsule   Other Visit Diagnoses       Prediabetes    -  Primary   Relevant Medications   metFORMIN  (GLUCOPHAGE ) 500 MG tablet     Vitamin B12 deficiency         BMI 50.0-59.9, adult (HCC) Current BMI 54.4       Relevant Medications   tirzepatide  (ZEPBOUND ) 2.5 MG/0.5ML injection vial   metFORMIN  (GLUCOPHAGE ) 500 MG tablet     Obesity She is adhering to a category three nutrition plan at approximately 60% and has lost two pounds since the last visit.  Dietary adherence is challenged by a busy lifestyle and food allergies, particularly to eggs and dairy. Alternatives such as protein shakes and overnight oats are being considered for breakfast.  She plans to start  Zepbound  for weight management, which is not covered by insurance but can be obtained through [employer] direct.   She is also on metformin  1000 mg twice daily to aid in weight management. Informed consent for Zepbound  includes potential side effects such as nausea and vomiting, constipation and diarrhea.  We have reviewed the risks and benefits of using a GLP-1. The patient denies a personal or  family history of medullary thyroid  cancer or MENII. The patient denies a history of pancreatitis. The potential risks and benefits of this GLP-1 were reviewed with the patient, and alternative treatment options were discussed. All questions were answered, and the patient wishes to move forward with this medication.   With the first dose being a loading dose. Outcomes may vary, with some requiring dose adjustments for optimal results. - Send prescription for Zepbound  2.5 mg weekly and discussed administration to [employer] direct - Educate on Zepbound  administration and potential side effects, including nausea and vomiting. - Advise to continue metformin  1000 mg twice daily, with the option to reduce to 500 mg twice daily if nausea occurs. - Discuss dietary alternatives for breakfast, including protein shakes and overnight oats.   Prediabetes Last A1c was 5.3 Medication(s): Metformin  1000 mg twice daily no GI or other side effects Polyphagia:Yes Lab Results  Component Value Date   HGBA1C 5.3 04/06/2024   HGBA1C 5.7 (H) 10/23/2023   HGBA1C 6.0 (H) 07/01/2023   HGBA1C 5.9 (H) 03/12/2023   HGBA1C 5.6 09/17/2022   Lab Results  Component Value Date   INSULIN  15.3 04/06/2024   INSULIN  14.0 10/23/2023   INSULIN  13.7 07/01/2023   INSULIN  12.4 03/12/2023    Plan: Continue and refill metformin  1000 mg twice daily Start Zepbound  2.5 mg weekly and monitor response and titrate dosing as indicated Continue working on nutrition plan to decrease simple carbohydrates, increase lean proteins and exercise to promote weight loss, improve glycemic control and prevent progression to Type 2 diabetes.  Meds ordered this encounter  Medications   Vitamin D , Ergocalciferol , (DRISDOL ) 1.25 MG (50000 UNIT) CAPS capsule    Sig: 1 po q Wed and 1 po q Sun    Dispense:  24 capsule    Refill:  0   tirzepatide  (ZEPBOUND ) 2.5 MG/0.5ML injection vial    Sig: Inject 2.5 mg into the skin once a week.    Dispense:  2 mL     Refill:  0   metFORMIN  (GLUCOPHAGE ) 500 MG tablet    Sig: 2 po with lunch and 2 dinner daily    Dispense:  120 tablet    Refill:  0    ** OV for RF **   Do not send RF request   Vitamin D  Deficiency Vitamin D  is at goal of 50.  Most recent vitamin D  level was 51.3. She is on  prescription ergocalciferol  50,000 IU twice weekly. No N/V or  muscle weakness with Ergo Lab Results  Component Value Date   VD25OH 51.3 04/06/2024   VD25OH 52.5 10/23/2023   VD25OH 57.1 07/01/2023    Plan: Continue and refill  prescription ergocalciferol  50,000 IU twice weekly Low vitamin D  levels can be associated with adiposity and may result in leptin resistance and weight gain. Also associated with fatigue.  Currently on vitamin D  supplementation without any adverse effects such as nausea, vomiting or muscle weakness.   Knee Osteoarthritis She reports severe knee pain due to osteoarthritis, with one knee being worse than the other. She has received three Visco injections at  [facility], which have provided significant relief. Advised continued weight loss to manage her condition, and she is actively working on this through her weight management plan. - Continue with weight management plan to aid in reducing knee pain. - Follow up with [facility] for ongoing management of knee osteoarthritis.  Fibromyalgia She experiences chronic pain due to fibromyalgia, which affects her daily functioning and weight management efforts. Reports having good and bad days, with pain impacting her ability to move and function. - Encourage regular physical activity as tolerated, such as walking and aqua therapy at the [facility] which she recently joined.  Hypertension Blood pressure is slightly elevated with systolic readings in the 140s and diastolic readings in the 70s to 80s.  BP Readings from Last 3 Encounters:  06/01/24 (!) 144/75  04/06/24 118/79  02/17/24 128/84   Lab Results  Component Value Date   NA 137  04/06/2024   CL 103 04/06/2024   K 4.1 04/06/2024   CO2 20 04/06/2024   BUN 18 04/06/2024   CREATININE 0.83 04/06/2024   EGFR 89 04/06/2024   CALCIUM 8.8 04/06/2024   ALBUMIN 3.8 (L) 04/06/2024   GLUCOSE 77 04/06/2024   She is currently on amlodipine  5 mg daily, irbesartan /hydrochlorothiazide  150/12.5 mg daily, and metoprolol  25 mg daily. The current medication regimen appears to be managing her blood pressure, but continued monitoring is necessary. - Continue current antihypertensive regimen: amlodipine  5 mg daily, irbesartan /hydrochlorothiazide  150/12.5 mg daily, and metoprolol  25 mg daily. Continue to work on nutrition plan to promote weight loss and improve BP control.   Reactive Depression She has been experiencing symptoms of reactive depression, as noted by her therapist. Significant stressors, including family responsibilities and managing her business, have contributed to her mental health challenges. She is working with a therapist to address these issues and has made lifestyle changes to focus on her well-being.  Monitor along with therapist/other providers.  General Health Maintenance She is actively working on improving her overall health through weight management, dietary changes, and physical activity. She has rejoined the [facility] for access to preferred exercise options, including walking, step classes, line dancing, and aqua therapy. - Encourage adherence to dietary plan and physical activity regimen. - Support continued engagement in [facility] activities for overall health improvement.  Follow-up She is scheduled for follow-up visits every three to four weeks with her healthcare provider to monitor her progress and adjust her treatment plan as needed. - Schedule follow-up appointment in three to four weeks with Dr. Joya, Iz[place], or another available provider.  Vitals Temp: 97.9 F (36.6 C) BP: (!) 144/75 Pulse Rate: 87 SpO2: 98 %   Anthropometric Measurements Height:  5' 6 (1.676 m) Weight: (!) 337 lb (152.9 kg) BMI (Calculated): 54.42 Weight at Last Visit: 339 lb Weight Lost Since Last Visit: 2 lb Weight Gained Since Last Visit: 0 Starting Weight: 353 lb Total Weight Loss (lbs): 16 lb (7.258 kg)   Body Composition  Body Fat %: 54.6 % Fat Mass (lbs): 184 lbs Muscle Mass (lbs): 145.4 lbs Total Body Water (lbs): 114.2 lbs Visceral Fat Rating : 21   Other Clinical Data Fasting: No Labs: No Today's Visit #: 14 Starting Date: 03/12/23     ASSESSMENT AND PLAN:  Diet: Cynthia [name] is currently in the action stage of change. As such, her goal is to get back to weight loss efforts. She has agreed to Category 3 Plan.  Exercise: Cynthia [name] has been instructed that some exercise is better than none for weight  loss and overall health benefits. Consider pool activities at the [facility] given fibromyalgia issues and knee osteoarthritis.  Behavior Modification:  We discussed the following Behavioral Modification Strategies today: increasing lean protein intake, decreasing simple carbohydrates, increasing vegetables, increase H2O intake, increase high fiber foods, no skipping meals, meal planning and cooking strategies, emotional eating strategies , avoiding temptations, and planning for success. We discussed various medication options to help Cynthia [name] with her weight loss efforts and we both agreed to start Zepbound  2.5 mg weekly for medical weight loss, and continue other medications as prescribed.  Return in about 4 weeks (around 06/29/2024).Cynthia [name] She was informed of the importance of frequent follow up visits to maximize her success with intensive lifestyle modifications for her multiple health conditions.  Attestation Statements:   Reviewed by clinician on day of visit: allergies, medications, problem list, medical history, surgical history, family history, social history, and previous encounter notes.   Time spent on visit including pre-visit chart review and  post-visit care and charting was 31 minutes.    Cynthia Beagley, PA-C

## 2024-06-02 ENCOUNTER — Encounter (INDEPENDENT_AMBULATORY_CARE_PROVIDER_SITE_OTHER): Payer: Self-pay | Admitting: Physician Assistant

## 2024-06-02 MED ORDER — TIRZEPATIDE-WEIGHT MANAGEMENT 2.5 MG/0.5ML ~~LOC~~ SOLN: 2.5000 mg | SUBCUTANEOUS | 0 refills | Status: DC

## 2024-06-22 ENCOUNTER — Encounter: Payer: Self-pay | Admitting: Physician Assistant

## 2024-06-22 ENCOUNTER — Other Ambulatory Visit: Payer: Self-pay | Admitting: Physician Assistant

## 2024-06-22 ENCOUNTER — Ambulatory Visit (INDEPENDENT_AMBULATORY_CARE_PROVIDER_SITE_OTHER): Admitting: Physician Assistant

## 2024-06-22 VITALS — BP 128/82 | HR 90 | Temp 97.8°F | Ht 66.0 in | Wt 338.6 lb

## 2024-06-22 DIAGNOSIS — N644 Mastodynia: Secondary | ICD-10-CM | POA: Diagnosis not present

## 2024-06-22 DIAGNOSIS — M722 Plantar fascial fibromatosis: Secondary | ICD-10-CM

## 2024-06-22 DIAGNOSIS — Z0000 Encounter for general adult medical examination without abnormal findings: Secondary | ICD-10-CM

## 2024-06-22 DIAGNOSIS — Z124 Encounter for screening for malignant neoplasm of cervix: Secondary | ICD-10-CM

## 2024-06-22 DIAGNOSIS — Z1211 Encounter for screening for malignant neoplasm of colon: Secondary | ICD-10-CM | POA: Diagnosis not present

## 2024-06-22 LAB — CBC WITH DIFFERENTIAL/PLATELET
Basophils Absolute: 0 K/uL (ref 0.0–0.1)
Basophils Relative: 0.6 % (ref 0.0–3.0)
Eosinophils Absolute: 0.1 K/uL (ref 0.0–0.7)
Eosinophils Relative: 2.5 % (ref 0.0–5.0)
HCT: 42.1 % (ref 36.0–46.0)
Hemoglobin: 13.9 g/dL (ref 12.0–15.0)
Lymphocytes Relative: 36.2 % (ref 12.0–46.0)
Lymphs Abs: 1.8 K/uL (ref 0.7–4.0)
MCHC: 33 g/dL (ref 30.0–36.0)
MCV: 92 fl (ref 78.0–100.0)
Monocytes Absolute: 0.5 K/uL (ref 0.1–1.0)
Monocytes Relative: 10 % (ref 3.0–12.0)
Neutro Abs: 2.6 K/uL (ref 1.4–7.7)
Neutrophils Relative %: 50.7 % (ref 43.0–77.0)
Platelets: 256 K/uL (ref 150.0–400.0)
RBC: 4.58 Mil/uL (ref 3.87–5.11)
RDW: 13.4 % (ref 11.5–15.5)
WBC: 5.1 K/uL (ref 4.0–10.5)

## 2024-06-22 LAB — TSH: TSH: 1.58 u[IU]/mL (ref 0.35–5.50)

## 2024-06-22 LAB — COMPREHENSIVE METABOLIC PANEL WITH GFR
ALT: 14 U/L (ref 0–35)
AST: 14 U/L (ref 0–37)
Albumin: 3.8 g/dL (ref 3.5–5.2)
Alkaline Phosphatase: 69 U/L (ref 39–117)
BUN: 15 mg/dL (ref 6–23)
CO2: 26 meq/L (ref 19–32)
Calcium: 8.8 mg/dL (ref 8.4–10.5)
Chloride: 103 meq/L (ref 96–112)
Creatinine, Ser: 0.84 mg/dL (ref 0.40–1.20)
GFR: 83.87 mL/min (ref >60.00)
Glucose, Bld: 84 mg/dL (ref 70–99)
Potassium: 4.2 meq/L (ref 3.5–5.1)
Sodium: 136 meq/L (ref 135–145)
Total Bilirubin: 0.3 mg/dL (ref 0.2–1.2)
Total Protein: 7.6 g/dL (ref 6.0–8.3)

## 2024-06-22 LAB — LIPID PANEL
Cholesterol: 127 mg/dL (ref 0–200)
HDL: 48.5 mg/dL (ref >39.00)
LDL Cholesterol: 69 mg/dL (ref 0–99)
NonHDL: 78.67
Total CHOL/HDL Ratio: 3
Triglycerides: 50 mg/dL (ref 0.0–149.0)
VLDL: 10 mg/dL (ref 0.0–40.0)

## 2024-06-22 NOTE — Progress Notes (Unsigned)
 Patient ID: Cynthia [name], female    DOB: [birth year], 43 y.o.   MRN: [id number]   Assessment & Plan:  Annual physical exam -     CBC with Differential/Platelet -     Comprehensive metabolic panel with GFR -     Lipid panel -     TSH  Encounter for screening for cervical cancer -     Ambulatory referral to Gynecology  Plantar fasciitis, bilateral -     Ambulatory referral to Podiatry  Screening for colon cancer -     Ambulatory referral to Gastroenterology  Breast pain in female -     MM 3D DIAGNOSTIC MAMMOGRAM BILATERAL BREAST; Future      Assessment and Plan Assessment & Plan Adult Wellness Visit Routine adult wellness visit with blood pressure within normal limits. Vaccinations are current. Deductible considerations discussed for scheduling procedures before December. 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 Symptoms include tenderness, shooting pain, and callus formation. Long hours on her feet contribute to the condition. Neuropathy likely related to borderline diabetes and family history of diabetes and foot issues. - Recommend frozen water bottle technique for plantar fasciitis - Continue wearing supportive shoes - Refer to [facility] for further evaluation and potential EPAT treatment - Continue Voltaren  75 mg twice daily for arthritis  Fibromyalgia Chronic fibromyalgia with pain primarily on the right side, including breast pain. Reports constant pain and heaviness in the left breast, possibly related to fibromyalgia or other causes. Family history of cancer noted, but no known BRCA mutations. Recent changes in breast sensation and heaviness warrant further investigation. - Perform breast  exam - Order diagnostic mammogram due to reported breast pain and heaviness  Age-appropriate screening and counseling performed today. Will check labs and call with results. Preventive measures discussed and printed in AVS for patient.   Patient Counseling: [x]   Nutrition: Stressed importance of moderation in sodium/caffeine intake, saturated fat and cholesterol, caloric balance, sufficient intake of fresh fruits, vegetables, and fiber.  [x]   Stressed the importance of regular exercise.   [x]   Substance Abuse: Discussed cessation/primary prevention of tobacco, alcohol, or other drug use; driving or other dangerous activities under the influence; availability of treatment for abuse.   []   Injury prevention: Discussed safety belts, safety helmets, smoke detector, smoking near bedding or upholstery.   []   Sexuality: Discussed sexually transmitted diseases, partner selection, use of condoms, avoidance of unintended pregnancy  and contraceptive alternatives.   [x]   Dental health: Discussed importance of regular tooth brushing, flossing, and dental visits.  [x]   Health maintenance and immunizations reviewed. Please refer to Health maintenance section.        Return in about 6 months (around 12/23/2024) for recheck/follow-up.    Subjective:    Chief Complaint  Patient presents with   Annual Exam    Pt seen today for annual CPE pt is fasting for labs; will not need pap today would like referral to obgyn; also GI referral placed pt no show appt for colon cancer screening; new referral will need to be placed for pt to complete.    HPI Discussed the use of AI scribe software for clinical note transcription with the patient, who gave verbal consent to proceed.  History  of Present Illness Cynthia [name] is a 43 year old female who presents for an annual physical exam.  She is concerned about persistent hard, painful areas on the bottom of her feet, especially the left foot, despite using  various foot creams and getting regular pedicures. She experiences shooting pains at night and tenderness in the morning. Her job involves long hours of standing and walking on cement, which exacerbates her symptoms. She wears well-fitted shoes from brands like [employer], [employer], and [place], and rotates them regularly. She has been on metformin  in the past and has been working on weight loss. She takes Voltaren  75 mg twice a day for arthritis. She also takes Flexeril . She has received gel shots in her knees, which have helped.  She is concerned about her family history of diabetes and foot issues, motivating her to maintain regular foot care. She reports some neuropathy symptoms, such as areas on her feet where she cannot feel touch.  She is due for a colon cancer screening, having had issues in the past and a family history of cancer. She prefers a traditional colonoscopy over other methods.  She is experiencing breast pain and heaviness, particularly on the right side, and has a family history of cancer, prompting her to seek a mammogram. She describes the left breast as feeling 'very heavy' and 'like dead weight', with pain and spider veins present.  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 She is also involved in community health and weight management activities.     Past Medical History:  Diagnosis Date   Allergy 11/20   Anxiety    Arthritis    Asthma    childhood   Asthma    Bilateral swelling of feet    Constipation    Depression 10/22/21   Fatigue    Fibromyalgia 2007   High blood pressure    Hypertension    on and off since 2007   Joint pain    Multiple food allergies    Osteoarthritis    Osteoarthritis    Palpitations    Prediabetes    Seasonal allergies    SOB (shortness of breath)    Vitamin D  deficiency     No past surgical history on  file.  Family History  Problem Relation Age of Onset   Arthritis Mother    Hypertension Mother    Obesity Mother    Heart disease Father    Hyperlipidemia Father    Alcohol abuse Father    Arthritis Father    Asthma Father    COPD Father    Hypertension Father    Heart failure Father    Liver disease Father    Alcoholism Father    Early death Maternal Grandmother    Hypertension Maternal Grandmother    Pneumonia Maternal Grandmother    Hearing loss Maternal Grandfather    Hypertension Maternal Grandfather    Stroke Maternal Grandfather    Vision loss Maternal Grandfather    Hypertension Paternal Grandmother    Heart disease Paternal Grandmother    Hypertension Paternal Grandfather    Heart failure Paternal Grandfather    Vision loss Paternal Grandfather     Social History   Tobacco Use   Smoking status: Never   Smokeless tobacco: Never  Vaping Use   Vaping status: Never Used  Substance Use Topics   Alcohol use:  No   Drug use: No     Allergies  Allergen Reactions   Prochlorperazine  Anxiety and Itching    prochlorperazine    Beef-Derived Drug Products Other (See Comments)   Pork-Derived Products Other (See Comments)   Justicia Adhatoda Hives and Other (See Comments)    Tree nuts  Justicia adhatoda leaf extract   Shellfish Allergy Rash    Review of Systems NEGATIVE UNLESS OTHERWISE INDICATED IN HPI      Objective:     BP 128/82 (BP Location: Right Arm, Patient Position: Sitting, Cuff Size: Normal)   Pulse 90   Temp 97.8 F (36.6 C) (Temporal)   Ht 5' 6 (1.676 m)   Wt (!) 338 lb 9.6 oz (153.6 kg)   SpO2 100%   BMI 54.65 kg/m   Wt Readings from Last 3 Encounters:  06/22/24 (!) 338 lb 9.6 oz (153.6 kg)  06/01/24 (!) 337 lb (152.9 kg)  04/06/24 (!) 339 lb (153.8 kg)    BP Readings from Last 3 Encounters:  06/22/24 128/82  06/01/24 (!) 144/75  04/06/24 118/79     Physical Exam Vitals and nursing note reviewed.  Constitutional:       Appearance: Normal appearance. She is obese. She is not toxic-appearing.  HENT:     Head: Normocephalic and atraumatic.     Right Ear: Tympanic membrane, ear canal and external ear normal.     Left Ear: Tympanic membrane, ear canal and external ear normal.     Nose: Nose normal.     Mouth/Throat:     Mouth: Mucous membranes are moist.  Eyes:     Extraocular Movements: Extraocular movements intact.     Conjunctiva/sclera: Conjunctivae normal.     Pupils: Pupils are equal, round, and reactive to light.  Cardiovascular:     Rate and Rhythm: Normal rate and regular rhythm.     Pulses: Normal pulses.     Heart sounds: Normal heart sounds.  Pulmonary:     Effort: Pulmonary effort is normal.     Breath sounds: Normal breath sounds.  Chest:  Breasts:    Right: Tenderness (mostly R upper outer quadrant) present. No swelling, bleeding, inverted nipple, mass, nipple discharge or skin change.     Left: Normal.  Abdominal:     General: Abdomen is flat. Bowel sounds are normal.     Palpations: Abdomen is soft.  Musculoskeletal:        General: Tenderness (pain along bilateral plantar fascia) present. Normal range of motion.     Cervical back: Normal range of motion and neck supple.  Lymphadenopathy:     Upper Body:     Right upper body: No supraclavicular, axillary or pectoral adenopathy.     Left upper body: No supraclavicular, axillary or pectoral adenopathy.  Skin:    General: Skin is warm and dry.  Neurological:     General: No focal deficit present.     Mental Status: She is alert and oriented to person, place, and time.  Psychiatric:        Mood and Affect: Mood normal.        Behavior: Behavior normal.        Thought Content: Thought content normal.        Judgment: Judgment normal.             Elgie Maziarz M Wayland Baik, PA-C

## 2024-06-23 NOTE — Telephone Encounter (Signed)
 Not showing any medications were sent for patient when in the office for CPE, ok to send all new prescriptions to new pharmacy listed?

## 2024-06-24 ENCOUNTER — Other Ambulatory Visit: Payer: Self-pay | Admitting: Physician Assistant

## 2024-06-24 ENCOUNTER — Other Ambulatory Visit: Payer: Self-pay

## 2024-06-24 ENCOUNTER — Ambulatory Visit: Payer: Self-pay | Admitting: Physician Assistant

## 2024-06-24 DIAGNOSIS — G43809 Other migraine, not intractable, without status migrainosus: Secondary | ICD-10-CM

## 2024-06-24 DIAGNOSIS — I10 Essential (primary) hypertension: Secondary | ICD-10-CM

## 2024-06-24 DIAGNOSIS — D508 Other iron deficiency anemias: Secondary | ICD-10-CM

## 2024-06-24 DIAGNOSIS — M797 Fibromyalgia: Secondary | ICD-10-CM

## 2024-06-24 DIAGNOSIS — G4709 Other insomnia: Secondary | ICD-10-CM

## 2024-06-24 DIAGNOSIS — R002 Palpitations: Secondary | ICD-10-CM

## 2024-06-24 MED ORDER — GABAPENTIN 300 MG PO CAPS: 300.0000 mg | ORAL_CAPSULE | Freq: Three times a day (TID) | ORAL | 0 refills | Status: DC

## 2024-06-24 MED ORDER — TOPIRAMATE 100 MG PO TABS: ORAL_TABLET | ORAL | 0 refills | Status: AC

## 2024-06-24 MED ORDER — DULOXETINE HCL 60 MG PO CPEP: 60.0000 mg | ORAL_CAPSULE | Freq: Every day | ORAL | 3 refills | Status: AC

## 2024-06-24 MED ORDER — CYCLOBENZAPRINE HCL 5 MG PO TABS
5.0000 mg | ORAL_TABLET | Freq: Three times a day (TID) | ORAL | 3 refills | Status: AC | PRN
Start: 2024-06-24 — End: ?

## 2024-06-24 MED ORDER — DICLOFENAC SODIUM 75 MG PO TBEC: 75.0000 mg | DELAYED_RELEASE_TABLET | Freq: Two times a day (BID) | ORAL | 0 refills | Status: DC | PRN

## 2024-06-24 MED ORDER — AMLODIPINE BESYLATE 5 MG PO TABS: 5.0000 mg | ORAL_TABLET | Freq: Every day | ORAL | 3 refills | Status: AC

## 2024-06-24 MED ORDER — METOPROLOL SUCCINATE ER 25 MG PO TB24: 25.0000 mg | ORAL_TABLET | Freq: Every day | ORAL | 0 refills | Status: DC

## 2024-06-24 MED ORDER — FERROUS SULFATE 325 (65 FE) MG PO TABS: 325.0000 mg | ORAL_TABLET | ORAL | 3 refills | Status: AC

## 2024-06-24 MED ORDER — SUMATRIPTAN SUCCINATE 50 MG PO TABS: ORAL_TABLET | ORAL | 4 refills | Status: AC

## 2024-06-24 MED ORDER — TRAZODONE HCL 100 MG PO TABS: 100.0000 mg | ORAL_TABLET | Freq: Every evening | ORAL | 3 refills | Status: AC | PRN

## 2024-06-24 NOTE — Telephone Encounter (Signed)
 All medications for patient sent to new pharmacy as requested except for Gabapentin ; no longer able to send in for patients

## 2024-06-29 ENCOUNTER — Ambulatory Visit: Admitting: Podiatry

## 2024-06-29 ENCOUNTER — Ambulatory Visit (INDEPENDENT_AMBULATORY_CARE_PROVIDER_SITE_OTHER): Admitting: Family Medicine

## 2024-07-03 ENCOUNTER — Other Ambulatory Visit: Payer: Self-pay | Admitting: Physician Assistant

## 2024-07-03 MED ORDER — IBUPROFEN 800 MG PO TABS: 800.0000 mg | ORAL_TABLET | Freq: Three times a day (TID) | ORAL | 0 refills | Status: DC | PRN

## 2024-07-06 ENCOUNTER — Ambulatory Visit
Admission: RE | Admit: 2024-07-06 | Discharge: 2024-07-06 | Disposition: A | Discharge status: Home / Self Care | Source: Ambulatory Visit | Attending: Physician Assistant | Admitting: Physician Assistant

## 2024-07-06 ENCOUNTER — Ambulatory Visit (INDEPENDENT_AMBULATORY_CARE_PROVIDER_SITE_OTHER): Admitting: Podiatry

## 2024-07-06 ENCOUNTER — Encounter: Payer: Self-pay | Admitting: Podiatry

## 2024-07-06 ENCOUNTER — Ambulatory Visit (INDEPENDENT_AMBULATORY_CARE_PROVIDER_SITE_OTHER)

## 2024-07-06 DIAGNOSIS — N644 Mastodynia: Secondary | ICD-10-CM

## 2024-07-06 DIAGNOSIS — M722 Plantar fascial fibromatosis: Secondary | ICD-10-CM | POA: Diagnosis not present

## 2024-07-06 MED ORDER — MELOXICAM 15 MG PO TABS: 15.0000 mg | ORAL_TABLET | Freq: Every day | ORAL | 0 refills | Status: DC

## 2024-07-06 NOTE — Progress Notes (Signed)
  Subjective:  Patient ID: Cynthia [name], female    DOB: January 08, 1981,   MRN: [id number]  Chief Complaint  Patient presents with   Foot Pain    My heels and arches hurt.  I have tingling and numbness, especially if I've been sitting or get up in the morning.  The left one shoots pain all the time when I'm laying.  I am borderline Diabetic.    43 y.o. female present for concern of bilateral heel and arch pain as above that has been ongoing for 8 months and worsening in the past few months. Relates pain with first steps but also pain randomly that will cause shocks of pain up the leg mostly on the left. Has tried different shoes. Works 11 hours on concrete floors a day. . Denies any other pedal complaints. Denies n/v/f/c.   Past Medical History:  Diagnosis Date   Allergy 11/20   Anxiety    Arthritis    Asthma    childhood   Asthma    Bilateral swelling of feet    Constipation    Depression 10/22/21   Fatigue    Fibromyalgia 2007   High blood pressure    Hypertension    on and off since 2007   Joint pain    Multiple food allergies    Osteoarthritis    Osteoarthritis    Palpitations    Prediabetes    Seasonal allergies    SOB (shortness of breath)    Vitamin D  deficiency     Objective:  Physical Exam: Vascular: DP/PT pulses 2/4 bilateral. CFT <3 seconds. Normal hair growth on digits. No edema.  Skin. No lacerations or abrasions bilateral feet.  Musculoskeletal: MMT 5/5 bilateral lower extremities in DF, PF, Inversion and Eversion. Deceased ROM in DF of ankle joint. Tender to the medial calcaneal tubercle bilaterally . No pain with achilles, PT or arch. No pain with calcaneal squeeze.  Neurological: Sensation intact to light touch.   Assessment:   1. Plantar fasciitis, bilateral      Plan:  Patient was evaluated and treated and all questions answered. Discussed plantar fasciitis with patient.  X-rays reviewed and discussed with patient. No acute fractures or  dislocations noted. Mild spurring noted at inferior calcaneus.  Discussed treatment options including, ice, NSAIDS, supportive shoes, bracing, and stretching. Stretching exercises provided to be done on a daily basis.   Prescription for meloxicam  provided and sent to pharmacy. PF brace dispensed.  Follow-up 6 weeks or sooner if any problems arise. In the meantime, encouraged to call the office with any questions, concerns, change in symptoms.      Asberry Failing, DPM

## 2024-07-06 NOTE — Patient Instructions (Signed)

## 2024-07-20 ENCOUNTER — Other Ambulatory Visit: Payer: Self-pay | Admitting: Physician Assistant

## 2024-07-20 DIAGNOSIS — R002 Palpitations: Secondary | ICD-10-CM

## 2024-07-20 DIAGNOSIS — I10 Essential (primary) hypertension: Secondary | ICD-10-CM

## 2024-07-27 ENCOUNTER — Ambulatory Visit (INDEPENDENT_AMBULATORY_CARE_PROVIDER_SITE_OTHER): Admitting: Family Medicine

## 2024-07-27 ENCOUNTER — Telehealth: Admitting: Physician Assistant

## 2024-07-27 DIAGNOSIS — J069 Acute upper respiratory infection, unspecified: Secondary | ICD-10-CM

## 2024-07-27 DIAGNOSIS — B9689 Other specified bacterial agents as the cause of diseases classified elsewhere: Secondary | ICD-10-CM

## 2024-07-27 MED ORDER — AMOXICILLIN-POT CLAVULANATE 875-125 MG PO TABS: 1.0000 | ORAL_TABLET | Freq: Two times a day (BID) | ORAL | 0 refills | Status: DC

## 2024-07-27 MED ORDER — PREDNISONE 20 MG PO TABS: 40.0000 mg | ORAL_TABLET | Freq: Every day | ORAL | 0 refills | Status: DC

## 2024-07-27 MED ORDER — PROMETHAZINE-DM 6.25-15 MG/5ML PO SYRP: 5.0000 mL | ORAL_SOLUTION | Freq: Four times a day (QID) | ORAL | 0 refills | Status: DC | PRN

## 2024-07-27 NOTE — Patient Instructions (Signed)
 Cynthia [name], thank you for joining Cynthia [name] CHRISTELLA Dickinson, PA-C for today's virtual visit.  While this provider is not your primary care provider (PCP), if your PCP is located in our provider database this encounter information will be shared with them immediately following your visit.   A [facility] MyChart account gives you access to today's visit and all your visits, tests, and labs performed at [facility]  click here if you don't have a [facility] MyChart account or go to [email or web address]  Consent: (Patient) Cynthia [name] provided verbal consent for this virtual visit at the beginning of the encounter.  Current Medications:  Current Outpatient Medications:    amoxicillin -clavulanate (AUGMENTIN ) 875-125 MG tablet, Take 1 tablet by mouth 2 (two) times daily., Disp: 20 tablet, Rfl: 0   predniSONE  (DELTASONE ) 20 MG tablet, Take 2 tablets (40 mg total) by mouth daily with breakfast., Disp: 10 tablet, Rfl: 0   promethazine -dextromethorphan (PROMETHAZINE -DM) 6.25-15 MG/5ML syrup, Take 5 mLs by mouth 4 (four) times daily as needed., Disp: 118 mL, Rfl: 0   albuterol  (VENTOLIN  HFA) 108 (90 Base) MCG/ACT inhaler, USE 2 INHALATIONS BY MOUTH  EVERY 4 HOURS AS NEEDED FOR WHEEZING OR SHORTNESS OF  BREATH, Disp: 25.5 g, Rfl: 1   amLODipine  (NORVASC ) 5 MG tablet, Take 1 tablet (5 mg total) by mouth daily., Disp: 90 tablet, Rfl: 3   cyanocobalamin  (VITAMIN B12) 500 MCG tablet, Take 2 tablets (1,000 mcg total) by mouth daily., Disp: , Rfl:    cyclobenzaprine  (FLEXERIL ) 5 MG tablet, Take 1 tablet (5 mg total) by mouth 3 (three) times daily as needed for muscle spasms., Disp: 90 tablet, Rfl: 3   diclofenac  (VOLTAREN ) 75 MG EC tablet, Take 1 tablet (75 mg total) by mouth 2 (two) times daily as needed., Disp: 60 tablet, Rfl: 0   DULoxetine  (CYMBALTA ) 60 MG capsule, Take 1 capsule (60 mg total) by mouth daily., Disp: 90 capsule, Rfl: 3   ferrous sulfate  325 (65 FE) MG tablet, Take 1 tablet  (325 mg total) by mouth every other day., Disp: 30 tablet, Rfl: 3   gabapentin  (NEURONTIN ) 300 MG capsule, Take 1 capsule (300 mg total) by mouth 3 (three) times daily., Disp: 270 capsule, Rfl: 0   HYDROcodone-acetaminophen (NORCO/VICODIN) 5-325 MG tablet, Take 1 tablet by mouth every 6 (six) hours as needed for moderate pain (pain score 4-6)., Disp: , Rfl:    ibuprofen  (ADVIL ) 800 MG tablet, Take 1 tablet (800 mg total) by mouth every 8 (eight) hours as needed. Take with food., Disp: 30 tablet, Rfl: 0   irbesartan -hydrochlorothiazide  (AVALIDE) 150-12.5 MG tablet, 1 TAB PO Q AM, Disp: 30 tablet, Rfl: 0   meloxicam  (MOBIC ) 15 MG tablet, Take 1 tablet (15 mg total) by mouth daily., Disp: 30 tablet, Rfl: 0   metFORMIN  (GLUCOPHAGE ) 500 MG tablet, 2 po with lunch and 2 dinner daily, Disp: 120 tablet, Rfl: 0   metoprolol  succinate (TOPROL -XL) 25 MG 24 hr tablet, Take 1 tablet by mouth once daily, Disp: 90 tablet, Rfl: 0   polyethylene glycol powder (GLYCOLAX /MIRALAX ) 17 GM/SCOOP powder, 17GR TWICE DAILY until stooling regularly, then qd, Disp: , Rfl:    Sennosides 8.6 MG CAPS, , Disp: , Rfl:    SUMAtriptan  (IMITREX ) 50 MG tablet, Take 1 tab (50mg ) orally at the onset of her migraine, may repeat in 2 hours if migraine persists.  Total daily maximum dose is 200 mg, Disp: 10 tablet, Rfl: 4   tirzepatide  (ZEPBOUND ) 2.5 MG/0.5ML injection vial, Inject 2.5 mg  into the skin once a week., Disp: 2 mL, Rfl: 0   topiramate  (TOPAMAX ) 100 MG tablet, TAKE 1 & 1/2 (ONE & ONE-HALF) TABLETS BY MOUTH TWICE DAILY FOR MIGRAINE PREVENTION, Disp: 270 tablet, Rfl: 0   traZODone  (DESYREL ) 100 MG tablet, Take 1 tablet (100 mg total) by mouth at bedtime as needed. for sleep, Disp: 90 tablet, Rfl: 3   UNABLE TO FIND, , Disp: , Rfl:    Vitamin D , Ergocalciferol , (DRISDOL ) 1.25 MG (50000 UNIT) CAPS capsule, 1 po q Wed and 1 po q Sun, Disp: 24 capsule, Rfl: 0   Medications ordered in this encounter:  Meds ordered this encounter   Medications   amoxicillin -clavulanate (AUGMENTIN ) 875-125 MG tablet    Sig: Take 1 tablet by mouth 2 (two) times daily.    Dispense:  20 tablet    Refill:  0    Supervising Provider:   LAMPTEY, PHILIP O [[id number]]   promethazine -dextromethorphan (PROMETHAZINE -DM) 6.25-15 MG/5ML syrup    Sig: Take 5 mLs by mouth 4 (four) times daily as needed.    Dispense:  118 mL    Refill:  0    Supervising Provider:   BLAISE ALEENE KIDD [[id number]]   predniSONE  (DELTASONE ) 20 MG tablet    Sig: Take 2 tablets (40 mg total) by mouth daily with breakfast.    Dispense:  10 tablet    Refill:  0    Supervising Provider:   BLAISE ALEENE KIDD [[id number]]     *If you need refills on other medications prior to your next appointment, please contact your pharmacy*  Follow-Up: Call back or seek an in-person evaluation if the symptoms worsen or if the condition fails to improve as anticipated.  [facility] [phone]  Other Instructions Upper Respiratory Infection, Adult An upper respiratory infection (URI) is a common viral infection of the nose, throat, and upper air passages that lead to the lungs. The most common type of URI is the common cold. URIs usually get better on their own, without medical treatment. What are the causes? A URI is caused by a virus. You may catch a virus by: Breathing in droplets from an infected person's cough or sneeze. Touching something that has been exposed to the virus (is contaminated) and then touching your mouth, nose, or eyes. What increases the risk? You are more likely to get a URI if: You are very young or very old. You have close contact with others, such as at work, school, or a health care facility. You smoke. You have long-term (chronic) heart or lung disease. You have a weakened disease-fighting system (immune system). You have nasal allergies or asthma. You are experiencing a lot of stress. You have poor nutrition. What are the signs or  symptoms? A URI usually involves some of the following symptoms: Runny or stuffy (congested) nose. Cough. Sneezing. Sore throat. Headache. Fatigue. Fever. Loss of appetite. Pain in your forehead, behind your eyes, and over your cheekbones (sinus pain). Muscle aches. Redness or irritation of the eyes. Pressure in the ears or face. How is this diagnosed? This condition may be diagnosed based on your medical history and symptoms, and a physical exam. Your health care provider may use a swab to take a mucus sample from your nose (nasal swab). This sample can be tested to determine what virus is causing the illness. How is this treated? URIs usually get better on their own within 7-10 days. Medicines cannot cure URIs, but your health care provider may  recommend certain medicines to help relieve symptoms, such as: Over-the-counter cold medicines. Cough suppressants. Coughing is a type of defense against infection that helps to clear the respiratory system, so take these medicines only as recommended by your health care provider. Fever-reducing medicines. Follow these instructions at home: Activity Rest as needed. If you have a fever, stay home from work or school until your fever is gone or until your health care provider says your URI cannot spread to other people (is no longer contagious). Your health care provider may have you wear a face mask to prevent your infection from spreading. Relieving symptoms Gargle with a mixture of salt and water 3-4 times a day or as needed. To make salt water, completely dissolve -1 tsp (3-6 g) of salt in 1 cup (237 mL) of warm water. Use a cool-mist humidifier to add moisture to the air. This can help you breathe more easily. Eating and drinking  Drink enough fluid to keep your urine pale yellow. Eat soups and other clear broths. General instructions  Take over-the-counter and prescription medicines only as told by your health care provider. These  include cold medicines, fever reducers, and cough suppressants. Do not use any products that contain nicotine or tobacco. These products include cigarettes, chewing tobacco, and vaping devices, such as e-cigarettes. If you need help quitting, ask your health care provider. Stay away from secondhand smoke. Stay up to date on all immunizations, including the yearly (annual) flu vaccine. Keep all follow-up visits. This is important. How to prevent the spread of infection to others URIs can be contagious. To prevent the infection from spreading: Wash your hands with soap and water for at least 20 seconds. If soap and water are not available, use hand sanitizer. Avoid touching your mouth, face, eyes, or nose. Cough or sneeze into a tissue or your sleeve or elbow instead of into your hand or into the air.  Contact a health care provider if: You are getting worse instead of better. You have a fever or chills. Your mucus is brown or red. You have yellow or brown discharge coming from your nose. You have pain in your face, especially when you bend forward. You have swollen neck glands. You have pain while swallowing. You have white areas in the back of your throat. Get help right away if: You have shortness of breath that gets worse. You have severe or persistent: Headache. Ear pain. Sinus pain. Chest pain. You have chronic lung disease along with any of the following: Making high-pitched whistling sounds when you breathe, most often when you breathe out (wheezing). Prolonged cough (more than 14 days). Coughing up blood. A change in your usual mucus. You have a stiff neck. You have changes in your: Vision. Hearing. Thinking. Mood. These symptoms may be an emergency. Get help right away. Call 911. Do not wait to see if the symptoms will go away. Do not drive yourself to the hospital. Summary An upper respiratory infection (URI) is a common infection of the nose, throat, and upper air  passages that lead to the lungs. A URI is caused by a virus. URIs usually get better on their own within 7-10 days. Medicines cannot cure URIs, but your health care provider may recommend certain medicines to help relieve symptoms. This information is not intended to replace advice given to you by your health care provider. Make sure you discuss any questions you have with your health care provider. Document Revised: 06/07/2021 Document Reviewed: 06/07/2021 Elsevier Patient Education  2024 Elsevier Inc.   If you have been instructed to have an in-person evaluation today at a local Urgent Care facility, please use the link below. It will take you to a list of all of our available [facility], including address, phone number and hours of operation. Please do not delay care.  [facility]  If you or a family member do not have a primary care provider, use the link below to schedule a visit and establish care. When you choose a [facility] primary care physician or advanced practice provider, you gain a long-term partner in health. Find a Primary Care Provider  Learn more about [facility] in-office and virtual care options:  - Get Care Now

## 2024-07-27 NOTE — Progress Notes (Signed)
 Virtual Visit Consent   Cynthia [name], you are scheduled for a virtual visit with a [facility] provider today. Just as with appointments in the office, your consent must be obtained to participate. Your consent will be active for this visit and any virtual visit you may have with one of our providers in the next 365 days. If you have a MyChart account, a copy of this consent can be sent to you electronically.  As this is a virtual visit, video technology does not allow for your provider to perform a traditional examination. This may limit your provider's ability to fully assess your condition. If your provider identifies any concerns that need to be evaluated in person or the need to arrange testing (such as labs, EKG, etc.), we will make arrangements to do so. Although advances in technology are sophisticated, we cannot ensure that it will always work on either your end or our end. If the connection with a video visit is poor, the visit may have to be switched to a telephone visit. With either a video or telephone visit, we are not always able to ensure that we have a secure connection.  By engaging in this virtual visit, you consent to the provision of healthcare and authorize for your insurance to be billed (if applicable) for the services provided during this visit. Depending on your insurance coverage, you may receive a charge related to this service.  I need to obtain your verbal consent now. Are you willing to proceed with your visit today? Cynthia [name] has provided verbal consent on 07/27/2024 for a virtual visit (video or telephone). Cynthia CHRISTELLA Dickinson, PA-C  Date: 07/27/2024 11:13 AM   Virtual Visit via Video Note   IDelon CHRISTELLA [name], connected with  Cynthia [name]  ([id number], 02-10-1981) on 07/27/24 at 11:00 AM EDT by a video-enabled telemedicine application and verified that I am speaking with the correct person using two identifiers.  Location: Patient: Virtual Visit Location  Patient: Home Provider: Virtual Visit Location Provider: Home Office   I discussed the limitations of evaluation and management by telemedicine and the availability of in person appointments. The patient expressed understanding and agreed to proceed.    History of Present Illness: Cynthia [name] is a 43 y.o. who identifies as a female who was assigned female at birth, and is being seen today for cough and congestion.  HPI: URI  This is a new problem. The current episode started 1 to 4 weeks ago (2 weeks; went to local UC on 07/18/24 and was negative for Covid and Flu). The problem has been unchanged. There has been no fever. Associated symptoms include congestion, coughing (rattling with cough), ear pain (right), headaches, a plugged ear sensation (with ringing in ear-right), sinus pain, a sore throat (scratchy) and wheezing (intermittent). Pertinent negatives include no diarrhea, nausea, rhinorrhea or vomiting. Associated symptoms comments: Post nasal drainage, decreased appetite, body aches. Treatments tried: OTC cold and flu combination medication. The treatment provided no relief.     Problems:  Patient Active Problem List   Diagnosis Date Noted   Arthralgia of left knee 02/03/2024   Arthralgia of right knee 02/03/2024   Iron deficiency anemia due to dietary causes 03/26/2023   Neck and shoulder pain 12/17/2022   Difficulty sleeping 12/17/2022   Other fatigue 12/17/2022   Mass of soft tissue of neck 12/17/2022   Palpitations 09/17/2022   Urinary frequency 09/17/2022   Primary osteoarthritis of both knees 08/23/2022   Bilateral impacted cerumen 12/29/2018  Chronic rhinitis 12/29/2018   Conductive hearing loss, bilateral 12/29/2018   Body mass index 50.0-59.9, adult (HCC) 12/12/2018   Chondromalacia patellae, 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   Constipation 12/10/2017   Hypersomnia 07/20/2016   Asthma 07/20/2016   Anxiety and depression 07/06/2016   Vitamin D  deficiency 07/06/2016    Seasonal allergies 02/15/2016   Insomnia 02/15/2016   Left ankle pain 02/22/2014   Encounter for annual physical exam 02/22/2014   Sinusitis nasal 02/22/2014   Migraines 01/15/2014   Borderline diabetes 01/13/2014   Morbid obesity (HCC) 01/13/2014   Primary hypertension 01/13/2014   Fibromyalgia 01/13/2014   Chronic pain of right knee 01/13/2014    Allergies:  Allergies  Allergen Reactions   Prochlorperazine  Anxiety and Itching    prochlorperazine    Beef-Derived Drug Products Other (See Comments)   Pork-Derived Products Other (See Comments)   Justicia Adhatoda Hives and Other (See Comments)    Tree nuts  Justicia adhatoda leaf extract   Shellfish Allergy Rash   Medications:  Current Outpatient Medications:    amoxicillin -clavulanate (AUGMENTIN ) 875-125 MG tablet, Take 1 tablet by mouth 2 (two) times daily., Disp: 20 tablet, Rfl: 0   predniSONE  (DELTASONE ) 20 MG tablet, Take 2 tablets (40 mg total) by mouth daily with breakfast., Disp: 10 tablet, Rfl: 0   promethazine -dextromethorphan (PROMETHAZINE -DM) 6.25-15 MG/5ML syrup, Take 5 mLs by mouth 4 (four) times daily as needed., Disp: 118 mL, Rfl: 0   albuterol  (VENTOLIN  HFA) 108 (90 Base) MCG/ACT inhaler, USE 2 INHALATIONS BY MOUTH  EVERY 4 HOURS AS NEEDED FOR WHEEZING OR SHORTNESS OF  BREATH, Disp: 25.5 g, Rfl: 1   amLODipine  (NORVASC ) 5 MG tablet, Take 1 tablet (5 mg total) by mouth daily., Disp: 90 tablet, Rfl: 3   cyanocobalamin  (VITAMIN B12) 500 MCG tablet, Take 2 tablets (1,000 mcg total) by mouth daily., Disp: , Rfl:    cyclobenzaprine  (FLEXERIL ) 5 MG tablet, Take 1 tablet (5 mg total) by mouth 3 (three) times daily as needed for muscle spasms., Disp: 90 tablet, Rfl: 3   diclofenac  (VOLTAREN ) 75 MG EC tablet, Take 1 tablet (75 mg total) by mouth 2 (two) times daily as needed., Disp: 60 tablet, Rfl: 0   DULoxetine  (CYMBALTA ) 60 MG capsule, Take 1 capsule (60 mg total) by mouth daily., Disp: 90 capsule, Rfl: 3   ferrous sulfate   325 (65 FE) MG tablet, Take 1 tablet (325 mg total) by mouth every other day., Disp: 30 tablet, Rfl: 3   gabapentin  (NEURONTIN ) 300 MG capsule, Take 1 capsule (300 mg total) by mouth 3 (three) times daily., Disp: 270 capsule, Rfl: 0   HYDROcodone-acetaminophen (NORCO/VICODIN) 5-325 MG tablet, Take 1 tablet by mouth every 6 (six) hours as needed for moderate pain (pain score 4-6)., Disp: , Rfl:    ibuprofen  (ADVIL ) 800 MG tablet, Take 1 tablet (800 mg total) by mouth every 8 (eight) hours as needed. Take with food., Disp: 30 tablet, Rfl: 0   irbesartan -hydrochlorothiazide  (AVALIDE) 150-12.5 MG tablet, 1 TAB PO Q AM, Disp: 30 tablet, Rfl: 0   meloxicam  (MOBIC ) 15 MG tablet, Take 1 tablet (15 mg total) by mouth daily., Disp: 30 tablet, Rfl: 0   metFORMIN  (GLUCOPHAGE ) 500 MG tablet, 2 po with lunch and 2 dinner daily, Disp: 120 tablet, Rfl: 0   metoprolol  succinate (TOPROL -XL) 25 MG 24 hr tablet, Take 1 tablet by mouth once daily, Disp: 90 tablet, Rfl: 0   polyethylene glycol powder (GLYCOLAX /MIRALAX ) 17 GM/SCOOP powder, 17GR TWICE DAILY until stooling regularly, then qd,  Disp: , Rfl:    Sennosides 8.6 MG CAPS, , Disp: , Rfl:    SUMAtriptan  (IMITREX ) 50 MG tablet, Take 1 tab (50mg ) orally at the onset of her migraine, may repeat in 2 hours if migraine persists.  Total daily maximum dose is 200 mg, Disp: 10 tablet, Rfl: 4   tirzepatide  (ZEPBOUND ) 2.5 MG/0.5ML injection vial, Inject 2.5 mg into the skin once a week., Disp: 2 mL, Rfl: 0   topiramate  (TOPAMAX ) 100 MG tablet, TAKE 1 & 1/2 (ONE & ONE-HALF) TABLETS BY MOUTH TWICE DAILY FOR MIGRAINE PREVENTION, Disp: 270 tablet, Rfl: 0   traZODone  (DESYREL ) 100 MG tablet, Take 1 tablet (100 mg total) by mouth at bedtime as needed. for sleep, Disp: 90 tablet, Rfl: 3   UNABLE TO FIND, , Disp: , Rfl:    Vitamin D , Ergocalciferol , (DRISDOL ) 1.25 MG (50000 UNIT) CAPS capsule, 1 po q Wed and 1 po q Sun, Disp: 24 capsule, Rfl: 0  Observations/Objective: Patient is  well-developed, well-nourished in no acute distress.  Resting comfortably at home.  Head is normocephalic, atraumatic.  No labored breathing.  Speech is clear and coherent with logical content.  Patient is alert and oriented at baseline.    Assessment and Plan: 1. Bacterial upper respiratory infection (Primary) - amoxicillin -clavulanate (AUGMENTIN ) 875-125 MG tablet; Take 1 tablet by mouth 2 (two) times daily.  Dispense: 20 tablet; Refill: 0 - promethazine -dextromethorphan (PROMETHAZINE -DM) 6.25-15 MG/5ML syrup; Take 5 mLs by mouth 4 (four) times daily as needed.  Dispense: 118 mL; Refill: 0 - predniSONE  (DELTASONE ) 20 MG tablet; Take 2 tablets (40 mg total) by mouth daily with breakfast.  Dispense: 10 tablet; Refill: 0  - Worsening over a week despite OTC medications - Will treat with Augmentin  - Add Promethazine  DM and Prednisone  for cough and congestion/inflammation - Can Add Mucinex (PLAIN) - Push fluids.  - Rest.  - Steam and humidifier can help - Seek in person evaluation if worsening or symptoms fail to improve    Follow Up Instructions: I discussed the assessment and treatment plan with the patient. The patient was provided an opportunity to ask questions and all were answered. The patient agreed with the plan and demonstrated an understanding of the instructions.  A copy of instructions were sent to the patient via MyChart unless otherwise noted below.    The patient was advised to call back or seek an in-person evaluation if the symptoms worsen or if the condition fails to improve as anticipated.    Cynthia CHRISTELLA Dickinson, PA-C

## 2024-08-03 ENCOUNTER — Encounter (INDEPENDENT_AMBULATORY_CARE_PROVIDER_SITE_OTHER): Payer: Self-pay | Admitting: Family Medicine

## 2024-08-03 ENCOUNTER — Encounter: Payer: Self-pay | Admitting: Physician Assistant

## 2024-08-03 ENCOUNTER — Ambulatory Visit (INDEPENDENT_AMBULATORY_CARE_PROVIDER_SITE_OTHER): Admitting: Family Medicine

## 2024-08-03 DIAGNOSIS — Z6841 Body Mass Index (BMI) 40.0 and over, adult: Secondary | ICD-10-CM | POA: Diagnosis not present

## 2024-08-03 DIAGNOSIS — R7303 Prediabetes: Secondary | ICD-10-CM | POA: Diagnosis not present

## 2024-08-03 DIAGNOSIS — R1310 Dysphagia, unspecified: Secondary | ICD-10-CM

## 2024-08-03 DIAGNOSIS — E559 Vitamin D deficiency, unspecified: Secondary | ICD-10-CM

## 2024-08-03 MED ORDER — VITAMIN D (ERGOCALCIFEROL) 1.25 MG (50000 UNIT) PO CAPS: ORAL_CAPSULE | ORAL | 0 refills | Status: DC

## 2024-08-03 MED ORDER — METFORMIN HCL 500 MG PO TABS: ORAL_TABLET | ORAL | 0 refills | Status: DC

## 2024-08-03 NOTE — Progress Notes (Signed)
 Cynthia [name], D.O.  ABFM, ABOM Specializing in Clinical Bariatric Medicine  Office located at: [address] [place], [place]  [address]   Assessment and Plan:  No orders of the defined types were placed in this encounter.   There are no discontinued medications.   No orders of the defined types were placed in this encounter.     FOR THE DISEASE OF OBESITY:  BMI 50.0-59.9, adult (HCC) Current BMI 55.71 Morbid obesity (HCC), start BMI 56.98 Assessment & Plan: Since last office visit on 06/01/2024 patient's muscle mass has decreased by 12.4 lbs. Fat mass has increased by 21 lbs. Total body water not registered by bio-impedance scale. Body fat % has increased by 4.8 %. Counseling done on how various foods will affect these numbers and how to maximize success  Total lbs lost to date: - 8 lbs Total weight loss percentage to date: -2.27 %   Recommended Dietary Goals Cynthia [name] is currently in the action stage of change. As such, her goal is to continue weight management plan.  She has agreed to START journaling 1400-1500 calories and 90+ grams protein daily using the CAT 3 MP with no snack calories as a guide.    Behavioral Intervention We discussed the following today: eating more whole foods, avoiding skipping meals and work on tracking and journaling calories using tracking application  Additional resources provided today: Handout on CAT 3 meal plan and Handout on Daily Food Journaling Log'  Evidence-based interventions for health behavior change were utilized today including the discussion of self monitoring techniques, problem-solving barriers and SMART goal setting techniques.   Regarding patient's less desirable eating habits and patterns, we employed the technique of small changes.   Goal(s) for next OV: bring in food log   Recommended Physical Activity Goals Cynthia [name] has been advised to work up to 300-450 minutes of moderate intensity aerobic activity a week  and strengthening exercises 2-3 times per week for cardiovascular health, weight loss maintenance and preservation of muscle mass.   She has agreed to: {EMEXERCISE:28847::Think about enjoyable ways to increase daily physical activity and overcoming barriers to exercise,Increase physical activity in their day and reduce sedentary time (increase NEAT).,Increase volume of physical activity to a goal of 240 minutes a week,Combine aerobic and strengthening exercises for efficiency and improved cardiometabolic health.}   Pharmacotherapy *** Did not start the zepbound  and wanted to talk about it with me. Has to pay out of pocket $360/month   Needs to be all in w meal plan and exercise if she does not want SE from Zepbound !   Pt states she prefers to get back to healthy habits befroe she spends the money. She would reather eat healthy and create healthy enviorment at home for now   ASSOCIATED CONDITIONS ADDRESSED TODAY:   Prediabetes Assessment & Plan: Lab Results  Component Value Date   HGBA1C 5.3 04/06/2024   HGBA1C 5.7 (H) 10/23/2023   HGBA1C 6.0 (H) 07/01/2023   INSULIN  15.3 04/06/2024   INSULIN  14.0 10/23/2023   INSULIN  13.7 07/01/2023    Pre-DM managed with Metformin  500 mg        Eat multiple small meals a day to get in her foods More hunger and cravins with stress Start journaing     Vitamin D  deficiency Assessment & Plan: Lab Results  Component Value Date   VD25OH 51.3 04/06/2024   VD25OH 52.5 10/23/2023   VD25OH 57.1 07/01/2023   Currently on prescription strength Vit D twice weekly with  good compliance and tolerance. No acute concerns. Continue supplementation (refill today) and weight loss efforts. Recheck as deemed medically necessary.   Follow up:   No follow-ups on file.  She was informed of the importance of frequent follow up visits to maximize her success with intensive lifestyle modifications for her multiple health conditions.   Subjective:     Chief complaint: Obesity Cynthia [name] is here to discuss her progress with her obesity treatment plan. She is on the Category 3 Plan and states she is following her eating plan approximately 40% of the time. Pt is walking 30 minutes 3 days per week   Interval History:  Cynthia [name] is here for a follow up office visit. Pt has experienced a weight gain of 8 lbs since last OV on 06/01/2024 with Shawn. I have not seen this patient since 12/16/2023. 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 She has been more back on track food wise the past week or so.   Dietary habits over the past few months:   - Tracking Calories/Macros: no, but she expresses interest in restarting this.   - Eating More Whole Foods: no, has been eating out more.   - Adequate Protein Intake: no  - Adequate Water Intake: yes  - Skipping Meals: yes  - Sleeping 7-9 Hours/ Night: yes   Review of Systems:  Pertinent positives were addressed with patient today.  Reviewed by clinician on day of visit: allergies, medications, problem list, medical history, surgical history, family history, social history, and previous encounter notes.  Weight Summary and Biometrics   Weight Lost Since Last Visit: 0  Weight Gained Since Last Visit: 8lb   Vitals Temp: 98 F (36.7 C) BP: 136/86 Pulse Rate: 82 SpO2: 99 %   Anthropometric Measurements Height: 5' 6 (1.676 m) Weight: (!) 345 lb (156.5 kg) BMI (Calculated): 55.71 Weight at Last Visit: 337lb Weight Lost Since Last Visit: 0 Weight Gained Since Last Visit: 8lb Starting Weight: 353lb Total Weight Loss (lbs): 8 lb (3.629 kg)   Body Composition  Body Fat %: 59.4 % Fat Mass (lbs): 205 lbs Muscle Mass (lbs): 133 lbs Visceral Fat Rating : 23   Other Clinical Data Fasting: yes Labs: no Today's Visit #:  15lb Starting Date: 03/12/23    Objective:   PHYSICAL EXAM: Blood pressure 136/86, pulse 82, temperature 98 F (36.7 C), height 5' 6 (1.676 m), weight (!) 345 lb (156.5 kg), last menstrual period 07/22/2024, SpO2 99%. Body mass index is 55.68 kg/m.  General: she is overweight, cooperative and in no acute distress. PSYCH: Has normal mood, affect and thought process.   HEENT: EOMI, sclerae are anicteric. Lungs: Normal breathing effort, no conversational dyspnea. Extremities: Moves * 4 Neurologic: A and O * 3, good insight  DIAGNOSTIC DATA REVIEWED: BMET    Component Value Date/Time   NA 136 06/22/2024 1355   NA 137 04/06/2024 0937   K 4.2 06/22/2024 1355   CL 103 06/22/2024 1355   CO2 26 06/22/2024 1355   GLUCOSE 84 06/22/2024 1355   BUN 15 06/22/2024 1355   BUN 18 04/06/2024 0937   CREATININE 0.84 06/22/2024 1355   CREATININE 0.72 11/30/2016 0926   CALCIUM 8.8 06/22/2024 1355   GFRNONAA >60 06/19/2022 0535   GFRNONAA >89 11/30/2016 0926   GFRAA 96 12/08/2018 1624   GFRAA >89  11/30/2016 0926   Lab Results  Component Value Date   HGBA1C 5.3 04/06/2024   HGBA1C 5.5 01/13/2014   Lab Results  Component Value Date   INSULIN  15.3 04/06/2024   INSULIN  12.4 03/12/2023   Lab Results  Component Value Date   TSH 1.58 06/22/2024   CBC    Component Value Date/Time   WBC 5.1 06/22/2024 1355   RBC 4.58 06/22/2024 1355   HGB 13.9 06/22/2024 1355   HGB 12.7 03/12/2023 1033   HCT 42.1 06/22/2024 1355   HCT 39.1 03/12/2023 1033   PLT 256.0 06/22/2024 1355   PLT 334 03/12/2023 1033   MCV 92.0 06/22/2024 1355   MCV 83 03/12/2023 1033   MCH 26.9 03/12/2023 1033   MCH 26.3 06/19/2022 0535   MCHC 33.0 06/22/2024 1355   RDW 13.4 06/22/2024 1355   RDW 14.5 03/12/2023 1033   Iron Studies    Component Value Date/Time   IRON 44 12/19/2022 0936   TIBC 372 12/19/2022 0936   FERRITIN 5 (L) 12/19/2022 0936   IRONPCTSAT 12 (L) 12/19/2022 0936   Lipid Panel      Component Value Date/Time   CHOL 127 06/22/2024 1355   CHOL 123 03/12/2023 1033   TRIG 50.0 06/22/2024 1355   HDL 48.50 06/22/2024 1355   HDL 50 03/12/2023 1033   CHOLHDL 3 06/22/2024 1355   VLDL 10.0 06/22/2024 1355   LDLCALC 69 06/22/2024 1355   LDLCALC 62 03/12/2023 1033   Hepatic Function Panel     Component Value Date/Time   PROT 7.6 06/22/2024 1355   PROT 7.4 04/06/2024 0937   ALBUMIN 3.8 06/22/2024 1355   ALBUMIN 3.8 (L) 04/06/2024 0937   AST 14 06/22/2024 1355   ALT 14 06/22/2024 1355   ALKPHOS 69 06/22/2024 1355   BILITOT 0.3 06/22/2024 1355   BILITOT 0.2 04/06/2024 0937      Component Value Date/Time   TSH 1.58 06/22/2024 1355   Nutritional Lab Results  Component Value Date   VD25OH 51.3 04/06/2024   VD25OH 52.5 10/23/2023   VD25OH 57.1 07/01/2023    Attestations:   I, ***, acting as a Stage manager for Cynthia Jenkins, DO., have compiled all relevant documentation for today's office visit on behalf of Cynthia Jenkins, DO, while in the presence of [employer], DO.  I have spent *** minutes in the care of the patient today including *** minutes face-to-face assessing and reviewing listed medical problems above as outlined in office visit note and providing nutritional and behavioral counseling as outlined in obesity care plan.   I have reviewed the above documentation for accuracy and completeness, and I agree with the above. Cynthia JINNY [name], D.O.  The 21st Century Cures Act was signed into law in 2016 which includes the topic of electronic health records.  This provides immediate access to information in MyChart.  This includes consultation notes, operative notes, office notes, lab results and pathology reports.  If you have any questions about what you read please let us  know at your next visit so we can discuss your concerns and take corrective action if need be.  We are right here with you.

## 2024-08-04 NOTE — Telephone Encounter (Signed)
 Please see pt msg and advise suggestions

## 2024-08-04 NOTE — Telephone Encounter (Signed)
 MyChart message sent to patient regarding notes per Alyssa. Referral to GI sent.

## 2024-08-17 ENCOUNTER — Ambulatory Visit (INDEPENDENT_AMBULATORY_CARE_PROVIDER_SITE_OTHER): Payer: Self-pay | Admitting: Podiatry

## 2024-08-17 DIAGNOSIS — Z91199 Patient's noncompliance with other medical treatment and regimen due to unspecified reason: Secondary | ICD-10-CM

## 2024-08-17 NOTE — Progress Notes (Signed)
 Cancel 24 hours

## 2024-08-21 ENCOUNTER — Other Ambulatory Visit: Payer: Self-pay | Admitting: Physician Assistant

## 2024-08-21 ENCOUNTER — Ambulatory Visit: Admitting: Physician Assistant

## 2024-08-21 DIAGNOSIS — M797 Fibromyalgia: Secondary | ICD-10-CM

## 2024-08-22 ENCOUNTER — Other Ambulatory Visit: Payer: Self-pay | Admitting: Physician Assistant

## 2024-08-24 ENCOUNTER — Ambulatory Visit: Admitting: Physician Assistant

## 2024-08-26 ENCOUNTER — Encounter: Payer: Self-pay | Admitting: Gastroenterology

## 2024-08-26 ENCOUNTER — Ambulatory Visit: Admitting: Podiatry

## 2024-08-31 ENCOUNTER — Ambulatory Visit (INDEPENDENT_AMBULATORY_CARE_PROVIDER_SITE_OTHER): Admitting: Family Medicine

## 2024-09-07 ENCOUNTER — Ambulatory Visit: Payer: Self-pay | Admitting: Physician Assistant

## 2024-09-07 ENCOUNTER — Encounter: Payer: Self-pay | Admitting: Physician Assistant

## 2024-09-07 ENCOUNTER — Ambulatory Visit (INDEPENDENT_AMBULATORY_CARE_PROVIDER_SITE_OTHER)

## 2024-09-07 ENCOUNTER — Ambulatory Visit (INDEPENDENT_AMBULATORY_CARE_PROVIDER_SITE_OTHER): Admitting: Physician Assistant

## 2024-09-07 VITALS — BP 128/78 | HR 96 | Temp 97.2°F | Ht 66.0 in | Wt 341.0 lb

## 2024-09-07 DIAGNOSIS — J4531 Mild persistent asthma with (acute) exacerbation: Secondary | ICD-10-CM

## 2024-09-07 DIAGNOSIS — R051 Acute cough: Secondary | ICD-10-CM

## 2024-09-07 DIAGNOSIS — R002 Palpitations: Secondary | ICD-10-CM | POA: Diagnosis not present

## 2024-09-07 DIAGNOSIS — M79604 Pain in right leg: Secondary | ICD-10-CM

## 2024-09-07 DIAGNOSIS — M797 Fibromyalgia: Secondary | ICD-10-CM

## 2024-09-07 MED ORDER — IBUPROFEN 800 MG PO TABS: 800.0000 mg | ORAL_TABLET | Freq: Three times a day (TID) | ORAL | 2 refills | Status: DC | PRN

## 2024-09-07 MED ORDER — ALBUTEROL SULFATE HFA 108 (90 BASE) MCG/ACT IN AERS: INHALATION_SPRAY | RESPIRATORY_TRACT | 1 refills | Status: AC

## 2024-09-07 MED ORDER — FLUTICASONE-SALMETEROL 100-50 MCG/ACT IN AEPB
1.0000 | INHALATION_SPRAY | Freq: Two times a day (BID) | RESPIRATORY_TRACT | 2 refills | Status: DC
Start: 2024-09-07 — End: 2024-09-13

## 2024-09-07 NOTE — Patient Instructions (Signed)
  VISIT SUMMARY: During your visit, we discussed your persistent upper respiratory symptoms, leg swelling, asthma exacerbation, fibromyalgia, and episodes of palpitations. We have outlined a plan to address each of these issues.  YOUR PLAN: RIGHT KNEE PAIN AND SWELLING: You have chronic right knee pain and swelling, which worsened after a fall. We are concerned about the possibility of a blood clot due to your family history and significant swelling. -Measure the circumference of your calf. -We will refer you to an orthopedic specialist for further evaluation. -Consider getting an ultrasound of your right leg - defer to [facility]   ASTHMA WITH ACUTE EXACERBATION: Your asthma has flared up, likely due to a recent upper respiratory infection and seasonal factors. -Prescribe Advair, one puff twice a day, with the option to increase to two puffs if needed. -Refill your albuterol  inhaler for rescue use. -Remember to rinse your mouth after using Advair.  UNRESOLVED UPPER RESPIRATORY INFECTION: You have persistent symptoms of an upper respiratory infection despite two courses of antibiotics. -Order a chest x-ray to check for any persistent infection or other lung issues.  FIBROMYALGIA: You have chronic fibromyalgia with widespread pain, especially on your right side. Your pain levels are currently higher, possibly due to your knee issues and overall health. -Continue taking ibuprofen  800 mg as needed for pain relief.  PALPITATIONS AND EPISODIC TACHYCARDIA: You experience intermittent palpitations and episodes of rapid heart rate, which resolve with rest. These could be triggered by stress, caffeine, or exertion. -Monitor your symptoms. If they persist, we may consider using a Zio patch for further evaluation.                      Contains text generated by Abridge.                                 Contains text generated by Abridge.

## 2024-09-07 NOTE — Progress Notes (Signed)
 Patient ID: Cynthia [name], female    DOB: 26-Oct-1981, 43 y.o.   MRN: [id number]   Assessment & Plan:  Mild persistent asthma with acute exacerbation -     Albuterol  Sulfate HFA; USE 2 INHALATIONS BY MOUTH  EVERY 4 HOURS AS NEEDED FOR WHEEZING OR SHORTNESS OF  BREATH  Dispense: 25.5 g; Refill: 1 -     Fluticasone-Salmeterol; Inhale 1 puff into the lungs 2 (two) times daily. Rinse mouth after use.  Dispense: 60 each; Refill: 2 -     DG Chest 2 View; Future  Acute leg pain, right  Acute cough -     DG Chest 2 View; Future  Palpitations  Other orders -     Ibuprofen ; Take 1 tablet (800 mg total) by mouth every 8 (eight) hours as needed. Take with food.  Dispense: 30 tablet; Refill: 2      Assessment and Plan Assessment & Plan Right knee pain and swelling Chronic right knee pain and swelling, exacerbated by recent fall. No fracture on initial x-ray. Persistent pain and swelling suggestive of possible non-arthritis related issue. Differential includes possible deep vein thrombosis (DVT) given family history of blood clots and significant swelling in the right leg. - Measure calf circumference - Refer to orthopedic specialist for further evaluation - Consider ultrasound of the right leg - defer to ortho  Asthma with acute exacerbation Mild persistent asthma with acute exacerbation, likely triggered by recent upper respiratory infection and seasonal factors. Asthma flare-ups in adulthood, but no exacerbations in the past two years. - Prescribe Advair, one puff twice a day, with option to increase to two puffs if needed - Refill albuterol  inhaler for rescue use - Advise rinsing mouth after using Advair  Unresolved upper respiratory infection Persistent symptoms of upper respiratory infection despite two courses of Augmentin . Symptoms include shortness of breath and cough. Differential includes unresolved infection or asthma exacerbation. - Order chest x-ray to evaluate for  persistent infection or other pulmonary issues  Fibromyalgia Chronic fibromyalgia with widespread pain, particularly affecting the right side. Current exacerbation of pain possibly related to knee issues and overall health status. - Prescribe ibuprofen  800 mg as needed for pain relief  Palpitations and episodic tachycardia Intermittent palpitations and episodic tachycardia, occurring 3-4 times a week, lasting 2-3 minutes, resolving with rest. No formal diagnosis of atrial fibrillation. Possible triggers include stress, caffeine, or exertion. - Monitor symptoms and consider Zio patch for further evaluation if symptoms persist     F/up prn     Subjective:    Chief Complaint  Patient presents with   Cough    Pt c/o cough and congestion persisting after round of antibiotics; pt also needing to have paperwork completed;  pt states she has irregular heart beat and having times she has to stop what she's doing because palpitations and at times breath taking. Pt needs handicap placecard    HPI Discussed the use of AI scribe software for clinical note transcription with the patient, who gave verbal consent to proceed.  History of Present Illness Cynthia [name] is a 43 year old female with asthma and fibromyalgia who presents with persistent upper respiratory symptoms and leg swelling.  She has been experiencing persistent upper respiratory symptoms since early September. Initially diagnosed with an upper respiratory infection during a virtual visit on September 8th, she was prescribed Augmentin  and a steroid. Despite this treatment, her symptoms persisted, leading her to seek care at a local urgent care on September 23rd, where  she received another round of Augmentin . There has been no improvement after the second course of antibiotics. She experiences shortness of breath and a persistent cough, which she attributes to the upper respiratory infection. She has not used a rescue inhaler in the  past two years but has a history of asthma flare-ups, particularly when she was at a higher weight. Her shortness of breath occurs even at rest.  She describes episodes of palpitations and 'flutters' that require her to sit down and 'reset.' These episodes occur about three to four times a week, lasting two to three minutes, and resolve on their own. She has not been diagnosed with atrial fibrillation but is concerned about the irregular breathing and its potential impact on her heart.  She reports significant swelling and pain in her right leg, which she attributes to a fall at work in late May or early June. She received gel shots in both knees for arthritis, which helped her left leg but not her right. The right leg pain is described as excruciating, with swelling from the foot up to the knee, and is worse by the end of the day. She has tried various shoes to alleviate the pain without success. Her family has a history of blood clots.  She has a history of fibromyalgia, which typically affects her right side, and she is currently experiencing increased pain levels. She maintains good blood pressure through dietary changes and is losing weight from her highest weight in years. Her current medications include ibuprofen  800 mg as needed for pain relief. She previously used Advair for asthma management, which she found effective.     Past Medical History:  Diagnosis Date   Allergy 11/20   Anxiety    Arthritis    Asthma    childhood   Asthma    Bilateral swelling of feet    Constipation    Depression 10/22/21   Fatigue    Fibromyalgia 2007   High blood pressure    Hypertension    on and off since 2007   Joint pain    Multiple food allergies    Osteoarthritis    Osteoarthritis    Palpitations    Prediabetes    Seasonal allergies    SOB (shortness of breath)    Vitamin D  deficiency     No past surgical history on file.  Family History  Problem Relation Age of Onset   Arthritis  Mother    Hypertension Mother    Obesity Mother    Heart disease Father    Hyperlipidemia Father    Alcohol abuse Father    Arthritis Father    Asthma Father    COPD Father    Hypertension Father    Heart failure Father    Liver disease Father    Alcoholism Father    Breast cancer Maternal Aunt    Breast cancer Maternal Aunt    Breast cancer Paternal Aunt    Breast cancer Paternal Aunt    Breast cancer Maternal Grandmother    Early death Maternal Grandmother    Hypertension Maternal Grandmother    Pneumonia Maternal Grandmother    Hearing loss Maternal Grandfather    Hypertension Maternal Grandfather    Stroke Maternal Grandfather    Vision loss Maternal Grandfather    Hypertension Paternal Grandmother    Heart disease Paternal Grandmother    Hypertension Paternal Grandfather    Heart failure Paternal Grandfather    Vision loss Paternal Grandfather    Breast  cancer Cousin    Breast cancer Cousin    Breast cancer Cousin     Social History   Tobacco Use   Smoking status: Never   Smokeless tobacco: Never  Vaping Use   Vaping status: Never Used  Substance Use Topics   Alcohol use: No   Drug use: No     Allergies  Allergen Reactions   Prochlorperazine  Anxiety and Itching    prochlorperazine    Bovine (Beef) Protein-Containing Drug Products Other (See Comments)   Porcine (Pork) Protein-Containing Drug Products Other (See Comments)   Justicia Adhatoda Hives and Other (See Comments)    Tree nuts  Justicia adhatoda leaf extract   Shellfish Allergy Rash    Review of Systems NEGATIVE UNLESS OTHERWISE INDICATED IN HPI      Objective:     BP 128/78 (BP Location: Left Arm, Patient Position: Sitting, Cuff Size: Large)   Pulse 96   Temp (!) 97.2 F (36.2 C) (Temporal)   Ht 5' 6 (1.676 m)   Wt (!) 341 lb (154.7 kg)   LMP 08/24/2024 (Exact Date)   SpO2 98%   BMI 55.04 kg/m   Wt Readings from Last 3 Encounters:  09/07/24 (!) 341 lb (154.7 kg)  08/03/24  (!) 345 lb (156.5 kg)  06/22/24 (!) 338 lb 9.6 oz (153.6 kg)    BP Readings from Last 3 Encounters:  09/07/24 128/78  08/03/24 136/86  06/22/24 128/82     Physical Exam Vitals and nursing note reviewed.  Constitutional:      Appearance: Normal appearance. She is obese. She is not toxic-appearing.  HENT:     Head: Normocephalic and atraumatic.     Right Ear: External ear normal.     Left Ear: External ear normal.  Eyes:     Extraocular Movements: Extraocular movements intact.     Conjunctiva/sclera: Conjunctivae normal.     Pupils: Pupils are equal, round, and reactive to light.  Cardiovascular:     Rate and Rhythm: Regular rhythm. Tachycardia present.     Pulses: Normal pulses.     Heart sounds: Normal heart sounds.     Comments: HR 101 bpm on exam  Pulmonary:     Effort: Pulmonary effort is normal.     Breath sounds: Normal breath sounds.  Musculoskeletal:        General: Swelling and tenderness present. Normal range of motion.     Cervical back: Normal range of motion and neck supple.     Right lower leg: Edema present.     Left lower leg: Edema present.     Comments: RLE > LLE edema, non-pitting Tender RLE, no bruising or erythema, some warmth to touch   Skin:    General: Skin is warm and dry.  Neurological:     General: No focal deficit present.     Mental Status: She is alert and oriented to person, place, and time.  Psychiatric:        Mood and Affect: Mood normal.        Behavior: Behavior normal.        Thought Content: Thought content normal.        Judgment: Judgment normal.             Donzell Coller M Kahliya Fraleigh, PA-C

## 2024-09-08 ENCOUNTER — Encounter: Payer: Self-pay | Admitting: Physician Assistant

## 2024-09-08 ENCOUNTER — Other Ambulatory Visit (HOSPITAL_COMMUNITY): Payer: Self-pay | Admitting: Orthopedic Surgery

## 2024-09-08 ENCOUNTER — Ambulatory Visit (HOSPITAL_COMMUNITY)
Admission: RE | Admit: 2024-09-08 | Discharge: 2024-09-08 | Disposition: A | Discharge status: Home / Self Care | Source: Ambulatory Visit | Attending: Vascular Surgery | Admitting: Vascular Surgery

## 2024-09-08 DIAGNOSIS — M7989 Other specified soft tissue disorders: Secondary | ICD-10-CM | POA: Insufficient documentation

## 2024-09-08 DIAGNOSIS — M79604 Pain in right leg: Secondary | ICD-10-CM | POA: Diagnosis present

## 2024-09-08 NOTE — Telephone Encounter (Signed)
 Please see pt msg and start PA for patient

## 2024-09-09 ENCOUNTER — Other Ambulatory Visit (HOSPITAL_COMMUNITY): Payer: Self-pay

## 2024-09-09 ENCOUNTER — Telehealth: Payer: Self-pay

## 2024-09-09 NOTE — Telephone Encounter (Signed)
 Pharmacy Patient Advocate Encounter   Received notification from Patient Advice Request messages that prior authorization for Advair Diskus 100-50MCG/ACT aerosol powder is required/requested.   Insurance verification completed.   The patient is insured through [facility].   Per test claim: PA required; PA submitted to above mentioned insurance via Latent Key/confirmation #/EOC [id number] Status is pending

## 2024-09-10 ENCOUNTER — Other Ambulatory Visit (HOSPITAL_COMMUNITY): Payer: Self-pay

## 2024-09-10 NOTE — Telephone Encounter (Signed)
 See PA denial for patient and advise alternative recommended

## 2024-09-10 NOTE — Telephone Encounter (Signed)
 Pharmacy Patient Advocate Encounter  Received notification from [facility] that Prior Authorization for Advair Diskus 100-50MCG/ACT aerosol powder has been DENIED.  Full denial letter will be uploaded to the media tab. See denial reason below.   PA #/Case ID/Reference #: [id number]

## 2024-09-13 ENCOUNTER — Other Ambulatory Visit: Payer: Self-pay | Admitting: Physician Assistant

## 2024-09-13 DIAGNOSIS — J4531 Mild persistent asthma with (acute) exacerbation: Secondary | ICD-10-CM

## 2024-09-13 MED ORDER — FLUTICASONE-SALMETEROL 115-21 MCG/ACT IN AERO: 2.0000 | INHALATION_SPRAY | Freq: Two times a day (BID) | RESPIRATORY_TRACT | 12 refills | Status: AC

## 2024-09-14 ENCOUNTER — Encounter (INDEPENDENT_AMBULATORY_CARE_PROVIDER_SITE_OTHER): Payer: Self-pay | Admitting: Family Medicine

## 2024-09-14 ENCOUNTER — Encounter: Payer: Self-pay | Admitting: Podiatry

## 2024-09-14 ENCOUNTER — Ambulatory Visit (INDEPENDENT_AMBULATORY_CARE_PROVIDER_SITE_OTHER): Admitting: Family Medicine

## 2024-09-14 ENCOUNTER — Ambulatory Visit (INDEPENDENT_AMBULATORY_CARE_PROVIDER_SITE_OTHER): Admitting: Podiatry

## 2024-09-14 DIAGNOSIS — R7303 Prediabetes: Secondary | ICD-10-CM

## 2024-09-14 DIAGNOSIS — Z6841 Body Mass Index (BMI) 40.0 and over, adult: Secondary | ICD-10-CM

## 2024-09-14 DIAGNOSIS — M722 Plantar fascial fibromatosis: Secondary | ICD-10-CM

## 2024-09-14 DIAGNOSIS — E559 Vitamin D deficiency, unspecified: Secondary | ICD-10-CM

## 2024-09-14 DIAGNOSIS — E538 Deficiency of other specified B group vitamins: Secondary | ICD-10-CM | POA: Diagnosis not present

## 2024-09-14 MED ORDER — VITAMIN D (ERGOCALCIFEROL) 1.25 MG (50000 UNIT) PO CAPS: ORAL_CAPSULE | ORAL | 0 refills | Status: DC

## 2024-09-14 MED ORDER — DEXAMETHASONE SODIUM PHOSPHATE 120 MG/30ML IJ SOLN
4.0000 mg | Freq: Once | INTRAMUSCULAR | Status: AC
  Administered 2024-09-14: 4 mg via INTRA_ARTICULAR

## 2024-09-14 MED ORDER — METFORMIN HCL 500 MG PO TABS: ORAL_TABLET | ORAL | 1 refills | Status: DC

## 2024-09-14 MED ORDER — TRIAMCINOLONE ACETONIDE 10 MG/ML IJ SUSP
2.5000 mg | Freq: Once | INTRAMUSCULAR | Status: AC
  Administered 2024-09-14: 2.5 mg via INTRA_ARTICULAR

## 2024-09-14 NOTE — Progress Notes (Signed)
  Subjective:  Patient ID: Cynthia [name], female    DOB: 1981/11/08,   MRN: [id number]  Chief Complaint  Patient presents with   Plantar Fasciitis    They're a little bit better.  I need to get new braces.  The hotel threw them away, so I haven't worn them the past four days.  The left heel gets very tender, hurt to walk on it.  It's very painful.  I ran out of the Meloxicam .    43 y.o. female present for concern of bilateral plantar fasciitis.  Relates a little bit better.  But she did relates the left heel is still very painful to walk on.  She did lose her plantar fascia braces. Denies any other pedal complaints. Denies n/v/f/c.   Past Medical History:  Diagnosis Date   Allergy 11/20   Anxiety    Arthritis    Asthma    childhood   Asthma    Bilateral swelling of feet    Constipation    Depression 10/22/21   Fatigue    Fibromyalgia 2007   High blood pressure    Hypertension    on and off since 2007   Joint pain    Multiple food allergies    Osteoarthritis    Osteoarthritis    Palpitations    Prediabetes    Seasonal allergies    SOB (shortness of breath)    Vitamin D  deficiency     Objective:  Physical Exam: Vascular: DP/PT pulses 2/4 bilateral. CFT <3 seconds. Normal hair growth on digits. No edema.  Skin. No lacerations or abrasions bilateral feet.  Musculoskeletal: MMT 5/5 bilateral lower extremities in DF, PF, Inversion and Eversion. Deceased ROM in DF of ankle joint. Tender to the medial calcaneal tubercle bilaterally . No pain with achilles, PT or arch. No pain with calcaneal squeeze.  Neurological: Sensation intact to light touch.   Assessment:   1. Plantar fasciitis, bilateral       Plan:  Patient was evaluated and treated and all questions answered. Discussed plantar fasciitis with patient.  X-rays reviewed and discussed with patient. No acute fractures or dislocations noted. Mild spurring noted at inferior calcaneus.  Discussed treatment options  including, ice, NSAIDS, supportive shoes, bracing, and stretching.  Continue stretching. Continue anti-inflammatories. Additional braces we dispensed due to the loss Injection offered procedure below.  Discussed Cmo and will consider.  Follow-up 6 weeks or sooner if any problems arise. In the meantime, encouraged to call the office with any questions, concerns, change in symptoms.   Procedure: Injection Tendon/Ligament Discussed alternatives, risks, complications and verbal consent was obtained.  Location: Bilateral plantar fascia . Skin Prep: Alcohol. Injectate: 1cc 0.5% marcaine  plain, 1 cc dexamethasone  0.5 cc kenalog  Disposition: Patient tolerated procedure well. Injection site dressed with a band-aid.  Post-injection care was discussed and return precautions discussed.     Asberry Failing, DPM

## 2024-09-14 NOTE — Progress Notes (Signed)
 [name], D.O.  ABFM, ABOM Specializing in Clinical Bariatric Medicine  Office located at: [address] [place], [place]  [address]      A) FOR THE CHRONIC DISEASE OF OBESITY:  Chief complaint: Obesity Cynthia [name] is here to discuss her progress with her obesity treatment plan.   History of present illness / Interval history:  Cynthia [name] is here today for her follow-up office visit.  Since last OV on 08/03/2024, pt is down 12 lbs.   Reports that it is challenging to get in foods that are high in protein and thus tends to under eat.   Pt reports increased leg pain, and had fallen into bad eating habits. She reports the first two knee shots were effective, but experienced issues with the third injection. She has been setting alarms to make sure she's eating. Has MRI scheduled on Sunday.   Average caloric intake is around 1500 calories  Average protein intake is 90 grams    08/03/24 11:00 09/14/24   Body Fat % 59.4 % 55.6 %  Muscle Mass (lbs) 133 lbs 140.8 lbs  Fat Mass (lbs) 205 lbs 185.4 lbs  Total Body Water (lbs)  110.2 lbs  Visceral Fat Rating  23 21   Counseling done on how various foods will affect these numbers and how to maximize success   Total lbs lost to date: -20 lbs Total Fat Mass in lbs lost to date: -7.4 Total weight loss percentage to date: -20 %   Nutrition Therapy She is journaling 1400-1500 calories and 90+ grams protein daily using the CAT 3 MP with no snack calories as a guide. and states she is following her eating plan approximately 45-50 % of the time.   - Tracking Calories/Macros: no -    - Eating More Whole Foods: yes  - Adequate Protein Intake: yes  - Adequate Water Intake: no -    - Skipping Meals: yes; has  been skipping breakfast recently  - Sleeping 7-9 Hours/ Night: yes   Cynthia [name] is currently in the action stage of change. As such, her goal is to continue weight management plan.  She has agreed to: continue  journaling 1400-1500 calories, but increase protein to 100+ grams daily.     Physical Activity Pt is walking 15-20 minutes 4 days per week and  water therapy 120 minutes 2 days per week.    Cynthia [name] has been advised to work up to 300-450 minutes of moderate intensity aerobic activity a week and strengthening exercises 2-3 times per week for cardiovascular health, weight loss maintenance and preservation of muscle mass.  She has agreed to : Think about enjoyable ways to increase daily physical activity and overcoming barriers to exercise, Increase physical activity in their day and reduce sedentary time (increase NEAT)., Increase volume of physical activity to a goal of 240 minutes a week, and Combine aerobic and strengthening exercises for efficiency and improved cardiometabolic health.   Behavioral Modifications Evidence-based interventions for health behavior change were utilized today including the discussion of  1) self monitoring techniques:  journal 2) problem-solving barriers:  none 3) self care:  none 4) SMART goals for next OV:  Journal intake. Regarding patient's less desirable eating habits and patterns, we employed the technique of small changes.   We discussed the following today: increasing lean protein intake to established goals, work on meal planning and preparation, work on tracking and journaling calories using tracking application, and continue to work on maintaining a reduced calorie  state, getting the recommended amount of protein, incorporating whole foods, making healthy choices, staying well hydrated and practicing mindfulness when eating. Additional resources provided today: Handout on Daily Food Journaling Log   Medical Interventions/ Pharmacotherapy Previous Bariatric surgery: none Pharmacotherapy for weight loss: She is currently taking Metformin  500 mg twice with lunch and dinner daily and tropiramate prn  for medical weight loss.    Discussed starting  Zepbound , but may start at next office visit because she is waiting to see if insurance approves it.   We discussed various medication options to help Cynthia [name] with her weight loss efforts and we both agreed to : Continue with current nutritional and behavioral strategies    B) OBESITY RELATED CONDITIONS ADDRESSED TODAY:  RECHECK A1c, VIT D, and VIT B labs at next office visit.   Morbid obesity (HCC), start BMI 56.98 BMI 50.0-59.9, adult (HCC) Current BMI 53.77   Prediabetes Assessment & Plan  Lab Results  Component Value Date   HGBA1C 5.3 04/06/2024   HGBA1C 5.7 (H) 10/23/2023   HGBA1C 6.0 (H) 07/01/2023   INSULIN  15.3 04/06/2024   INSULIN  14.0 10/23/2023   INSULIN  13.7 07/01/2023  On Metformin  500 mg  2 po with lunch and 2 dinner daily with good compliance and tolerance. Hunger and cravings are well controlled. A1c is well controlled at 5.3. No acute concerns today. Pt will cont to decrease simple carbs/sugars and increase protein. Increase exercise as able. Cont regimen (refill today). Recheck A1c at next OV.     Vitamin D  deficiency Assessment & Plan Lab Results  Component Value Date   VD25OH 51.3 04/06/2024   VD25OH 52.5 10/23/2023   VD25OH 57.1 07/01/2023  Currently on Ergo 50,000 units 1 po q Wed and 1 po q Sun  with good compliance and tolerance. Vit D levels at goal. No acute concerns. Cont supplementation(refill today) and recheck levels at next office visit.    Vitamin B12 deficiency Assessment & Plan - B12 level is 377, not at goal of over 500.  This diagnosis was reviewed with the patient and education was provided.  Lab Results  Component Value Date   VITAMINB12 377 04/06/2024  -Currently on OTC B12 supplement 1000 mcg daily. - Continue prudent nutritional plan and focus on b12 rich foods such as lean red meats; poultry; eggs; seafood; beans, peas, and lentils; nuts and seeds; and soy products - We will continue to monitor as deemed clinically necessary. -  Counseling provided today -VIT B12 labs at next OV.     Medications Discontinued During This Encounter  Medication Reason   metFORMIN  (GLUCOPHAGE ) 500 MG tablet Reorder   Vitamin D , Ergocalciferol , (DRISDOL ) 1.25 MG (50000 UNIT) CAPS capsule Reorder     Meds ordered this encounter  Medications   metFORMIN  (GLUCOPHAGE ) 500 MG tablet    Sig: 2 po with lunch and 2 dinner daily    Dispense:  120 tablet    Refill:  1    ** OV for RF **   Do not send RF request   Vitamin D , Ergocalciferol , (DRISDOL ) 1.25 MG (50000 UNIT) CAPS capsule    Sig: 1 po q Wed and 1 po q Sun    Dispense:  24 capsule    Refill:  0      Follow up:   Return 10/26/2024 10:40 AM.  She was informed of the importance of frequent follow up visits to maximize her success with intensive lifestyle modifications for her multiple health conditions.   Weight Summary  and Biometrics   Weight Lost Since Last Visit: 12lb  Weight Gained Since Last Visit: 0    Vitals Temp: (!) 97.5 F (36.4 C) BP: (!) 142/82 Pulse Rate: 78 SpO2: 98 %   Anthropometric Measurements Height: 5' 6 (1.676 m) Weight: (!) 333 lb (151 kg) BMI (Calculated): 53.77 Weight at Last Visit: 345lb Weight Lost Since Last Visit: 12lb Weight Gained Since Last Visit: 0 Starting Weight: 353lb Total Weight Loss (lbs): 20 lb (9.072 kg)   Body Composition  Body Fat %: 55.6 % Fat Mass (lbs): 185.4 lbs Muscle Mass (lbs): 140.8 lbs Total Body Water (lbs): 110.2 lbs Visceral Fat Rating : 21   Other Clinical Data Fasting: yes Labs: no Today's Visit #: 16 Starting Date: 03/12/23    Objective:   PHYSICAL EXAM: Blood pressure (!) 142/82, pulse 78, temperature (!) 97.5 F (36.4 C), height 5' 6 (1.676 m), weight (!) 333 lb (151 kg), last menstrual period 09/10/2024, SpO2 98%. Body mass index is 53.75 kg/m.  General: she is overweight, cooperative and in no acute distress. PSYCH: Has normal mood, affect and thought process.   HEENT:  EOMI, sclerae are anicteric. Lungs: Normal breathing effort, no conversational dyspnea. Extremities: Moves * 4 Neurologic: A and O * 3, good insight  DIAGNOSTIC DATA REVIEWED: BMET    Component Value Date/Time   NA 136 06/22/2024 1355   NA 137 04/06/2024 0937   K 4.2 06/22/2024 1355   CL 103 06/22/2024 1355   CO2 26 06/22/2024 1355   GLUCOSE 84 06/22/2024 1355   BUN 15 06/22/2024 1355   BUN 18 04/06/2024 0937   CREATININE 0.84 06/22/2024 1355   CREATININE 0.72 11/30/2016 0926   CALCIUM 8.8 06/22/2024 1355   GFRNONAA >60 06/19/2022 0535   GFRNONAA >89 11/30/2016 0926   GFRAA 96 12/08/2018 1624   GFRAA >89 11/30/2016 0926   Lab Results  Component Value Date   HGBA1C 5.3 04/06/2024   HGBA1C 5.5 01/13/2014   Lab Results  Component Value Date   INSULIN  15.3 04/06/2024   INSULIN  12.4 03/12/2023   Lab Results  Component Value Date   TSH 1.58 06/22/2024   CBC    Component Value Date/Time   WBC 5.1 06/22/2024 1355   RBC 4.58 06/22/2024 1355   HGB 13.9 06/22/2024 1355   HGB 12.7 03/12/2023 1033   HCT 42.1 06/22/2024 1355   HCT 39.1 03/12/2023 1033   PLT 256.0 06/22/2024 1355   PLT 334 03/12/2023 1033   MCV 92.0 06/22/2024 1355   MCV 83 03/12/2023 1033   MCH 26.9 03/12/2023 1033   MCH 26.3 06/19/2022 0535   MCHC 33.0 06/22/2024 1355   RDW 13.4 06/22/2024 1355   RDW 14.5 03/12/2023 1033   Iron Studies    Component Value Date/Time   IRON 44 12/19/2022 0936   TIBC 372 12/19/2022 0936   FERRITIN 5 (L) 12/19/2022 0936   IRONPCTSAT 12 (L) 12/19/2022 0936   Lipid Panel     Component Value Date/Time   CHOL 127 06/22/2024 1355   CHOL 123 03/12/2023 1033   TRIG 50.0 06/22/2024 1355   HDL 48.50 06/22/2024 1355   HDL 50 03/12/2023 1033   CHOLHDL 3 06/22/2024 1355   VLDL 10.0 06/22/2024 1355   LDLCALC 69 06/22/2024 1355   LDLCALC 62 03/12/2023 1033   Hepatic Function Panel     Component Value Date/Time   PROT 7.6 06/22/2024 1355   PROT 7.4 04/06/2024 0937    ALBUMIN 3.8 06/22/2024 1355  ALBUMIN 3.8 (L) 04/06/2024 0937   AST 14 06/22/2024 1355   ALT 14 06/22/2024 1355   ALKPHOS 69 06/22/2024 1355   BILITOT 0.3 06/22/2024 1355   BILITOT 0.2 04/06/2024 0937      Component Value Date/Time   TSH 1.58 06/22/2024 1355   Nutritional Lab Results  Component Value Date   VD25OH 51.3 04/06/2024   VD25OH 52.5 10/23/2023   VD25OH 57.1 07/01/2023    Attestations:   I, Feliciano Mingle, acting as a stage manager for Barnie Jenkins, DO., have compiled all relevant documentation for today's office visit on behalf of Barnie Jenkins, DO, while in the presence of [employer], DO.    I have reviewed the above documentation for accuracy and completeness, and I agree with the above. Barnie JINNY Jenkins, D.O.  The 21st Century Cures Act was signed into law in 2016 which includes the topic of electronic health records.  This provides immediate access to information in MyChart.  This includes consultation notes, operative notes, office notes, lab results and pathology reports.  If you have any questions about what you read please let us  know at your next visit so we can discuss your concerns and take corrective action if need be.  We are right here with you.

## 2024-09-23 ENCOUNTER — Encounter: Payer: Self-pay | Admitting: Physician Assistant

## 2024-09-25 ENCOUNTER — Ambulatory Visit

## 2024-09-25 ENCOUNTER — Telehealth: Payer: Self-pay | Admitting: Physician Assistant

## 2024-09-25 NOTE — Telephone Encounter (Signed)
 CALLED PT TO GET HER SCHE FOR SLEEPING PROBLEMS,OK'D PER PCP.

## 2024-09-25 NOTE — Progress Notes (Signed)
  Orthotics   Patient was present and evaluated for Custom molded foot orthotics. Patient will benefit from CFO's to provide total contact to BIL MLA's helping to balance and distribute body weight more evenly across BIL feet helping to reduce plantar pressure and pain. Orthotic will also encourage FF / RF alignment  Patient was scanned today and will return for fitting upon receipt  Patient reports that ded and oop is met for year  Cynthia [name]

## 2024-09-28 ENCOUNTER — Encounter (INDEPENDENT_AMBULATORY_CARE_PROVIDER_SITE_OTHER): Payer: Self-pay | Admitting: Family Medicine

## 2024-10-07 ENCOUNTER — Ambulatory Visit (INDEPENDENT_AMBULATORY_CARE_PROVIDER_SITE_OTHER): Admitting: Family Medicine

## 2024-10-07 ENCOUNTER — Encounter (INDEPENDENT_AMBULATORY_CARE_PROVIDER_SITE_OTHER): Payer: Self-pay | Admitting: Family Medicine

## 2024-10-07 VITALS — BP 129/76 | HR 86 | Temp 98.2°F | Ht 66.0 in | Wt 332.0 lb

## 2024-10-07 DIAGNOSIS — R7303 Prediabetes: Secondary | ICD-10-CM

## 2024-10-07 DIAGNOSIS — Z6841 Body Mass Index (BMI) 40.0 and over, adult: Secondary | ICD-10-CM | POA: Diagnosis not present

## 2024-10-07 DIAGNOSIS — E65 Localized adiposity: Secondary | ICD-10-CM

## 2024-10-07 MED ORDER — TIRZEPATIDE-WEIGHT MANAGEMENT 2.5 MG/0.5ML ~~LOC~~ SOLN: 2.5000 mg | SUBCUTANEOUS | 0 refills | Status: DC

## 2024-10-07 MED ORDER — TIRZEPATIDE 2.5 MG/0.5ML ~~LOC~~ SOAJ: 2.5000 mg | SUBCUTANEOUS | 0 refills | Status: DC

## 2024-10-07 NOTE — Progress Notes (Signed)
 Cynthia [name], D.O.  ABFM, ABOM Specializing in Clinical Bariatric Medicine  Office located at: [address] [place], [place]  [address]    FOR THE CHRONIC DISEASE OF OBESITY:   Morbid obesity (HCC), start BMI 56.98 BMI 50.0-59.9, adult (HCC) Current BMI 53.61  Weight Summary and Body Composition Analysis  Weight Lost Since Last Visit: 1lb  Weight Gained Since Last Visit: 0lb    Vitals Temp: 98.2 F (36.8 C) BP: 129/76 Pulse Rate: 86 SpO2: 99 %   Anthropometric Measurements Height: 5' 6 (1.676 m) Weight: (!) 332 lb (150.6 kg) BMI (Calculated): 53.61 Weight at Last Visit: 333lb Weight Lost Since Last Visit: 1lb Weight Gained Since Last Visit: 0lb Starting Weight: 353lb Total Weight Loss (lbs): 21 lb (9.526 kg)   Body Composition  Body Fat %: 55.5 % Fat Mass (lbs): 184.4 lbs Muscle Mass (lbs): 140.6 lbs Total Body Water (lbs): 110.4 lbs Visceral Fat Rating : 21   Other Clinical Data Fasting: yes Labs: no Today's Visit #: 17 Starting Date: 03/12/23    Chief complaint: Obesity  Interval History Cynthia [name] is here for a follow-up office visit to discuss her progress with her obesity treatment plan. She is keeping a food journal and adhering to recommended goals of 1400-1500 calories and 100+ grams protein and states she is following her eating plan approximately 75 % of the time. She is walking 15-20 minutes 3 days per week and is doing aqua therapy 90 minutes 2 days per week.   She has experienced a weight loss of 1 lb since last OV on 09/14/2024.   Her dietary and life habits include:  - Tracking Calories/Macros: yes - averages 1400-1500 cal on most days  - Eating More Whole Foods: yes  - Adequate Protein Intake: yes - averages 100+ grams protein on most day  - Adequate Water Intake: yes  - Skipping Meals: yes  - Sleeping 7-9 Hours/ Night: yes    09/14/24 10/07/24 07:00   Body Fat % 55.6 % 55.5 %  Muscle Mass (lbs) 140.8 lbs  140.6 lbs  Fat Mass (lbs) 185.4 lbs 184.4 lbs  Total Body Water (lbs) 110.2 lbs 110.4 lbs  Visceral Fat Rating  21 21   Counseling done on how various foods will affect these numbers and how to maximize success  Total Fat mass lost to date: - 13.2 lbs Total lbs lost to date: - 21 lbs Total weight loss percentage to date: - 5.95 %   Nutritional and Behavioral Counseling:  We discussed the following today: continue to work on maintaining a reduced calorie state, getting the recommended amount of protein, incorporating whole foods, making healthy choices, staying well hydrated and practicing mindfulness when eating.  Additional resources provided today: Handout on CAT 2 meal plan, Handout on CAT 1-2 breakfast options, Handout on CAT 1-2 lunch options, and Handout on Daily Food Journaling Log, Handout on general healthy eating tips.  Evidence-based interventions for health behavior change were utilized today including the discussion of self monitoring techniques, problem-solving barriers and SMART goal setting techniques.   Regarding patient's less desirable eating habits and patterns, we employed the technique of small changes.   SMART Goal(s) created today: n/a   Recommended Dietary Goals [name] is currently in the action stage of change. As such, her goal is to continue weight management plan.  She has agreed to continue keeping a food journal and adhering to recommended goals of 1400-1500 calories and 100+ grams protein  Recommended Physical Activity Goals [name] has been advised to work up to 300-450 minutes of moderate intensity aerobic activity a week and strengthening exercises 2-3 times per week for cardiovascular health, weight loss maintenance and preservation of muscle mass.   She may Continue to gradually increase the amount and intensity of exercise routine   Medical Interventions and Pharmacotherapy Previous Bariatric surgery: n/a  Pharmacotherapy: She reports  ongoing issues with her leg since a fall in May. She is being followed by orthopedics and was recently diagnosed with a torn meniscus and bone-on-bone bruising. She was advised that she will need a TKR. She states, 'my orthopedic doctor wants me on a weight-loss medication as soon as possible to get my BMI < 40.' Additionally, her hunger and cravings remain poorly controlled, and she feels that Metformin  alone is not effective.  She denies a personal history of pancreatitis and denies any family history of medullary thyroid  carcinoma or multiple endocrine neoplasia type II. After discussing treatment options, mechanisms of action, benefits, potential side effects, contraindications, and engaging in shared decision-making, she is agreeable to starting Tirzepatide  2.5 mg weekly. I sent a prescription for Mounjaro  after providing her with a coupon for a one-month free sample. I also prescribed Zepbound  in case the coupon does not work.    OBESITY RELATED CONDITIONS ADDRESSED TODAY:    Meds ordered this encounter  Medications   tirzepatide  (ZEPBOUND ) 2.5 MG/0.5ML injection vial    Sig: Inject 2.5 mg into the skin once a week.    Dispense:  2 mL    Refill:  0   tirzepatide  (MOUNJARO ) 2.5 MG/0.5ML Pen    Sig: Inject 2.5 mg into the skin once a week.    Dispense:  2 mL    Refill:  0      Prediabetes Assessment & Plan: Lab Results  Component Value Date   HGBA1C 5.3 04/06/2024   HGBA1C 5.7 (H) 10/23/2023   HGBA1C 6.0 (H) 07/01/2023   INSULIN  15.3 04/06/2024   INSULIN  14.0 10/23/2023   INSULIN  13.7 07/01/2023    Pre-DM managed with Metformin  500 mg 2 po with lunch and 2 dinner daily. Denies GI issues.  She reports having excessive hunger and cravings (see pharmacotherapy note above). Cont Metformin  at current dose and working on nutrition plan to decrease simple carbohydrates, increase lean proteins and exercise to promote weight loss and prevent progression to T2DM.    Visceral  obesity Assessment & Plan: Current visceral fat rating: 21.  The visceral fat rating should be < 12 in a female. Visceral adipose tissue is a hormonally active component of total body fat. This body composition phenotype is associated with medical disorders such as metabolic syndrome, cardiovascular disease and several malignancies including prostate, breast, and colorectal cancers. Cont goal of losing 7-10% of weight via prudent nutritional plan and lifestyle changes.     Objective:   PHYSICAL EXAM: Blood pressure 129/76, pulse 86, temperature 98.2 F (36.8 C), height 5' 6 (1.676 m), weight (!) 332 lb (150.6 kg), last menstrual period 09/10/2024, SpO2 99%. Body mass index is 53.59 kg/m.  General: she is overweight, cooperative and in no acute distress. PSYCH: Has normal mood, affect and thought process.   HEENT: EOMI, sclerae are anicteric. Lungs: Normal breathing effort, no conversational dyspnea. Extremities: Moves * 4 Neurologic: A and O * 3, good insight  DIAGNOSTIC DATA REVIEWED: BMET    Component Value Date/Time   NA 136 06/22/2024 1355   NA 137 04/06/2024 0937   K  4.2 06/22/2024 1355   CL 103 06/22/2024 1355   CO2 26 06/22/2024 1355   GLUCOSE 84 06/22/2024 1355   BUN 15 06/22/2024 1355   BUN 18 04/06/2024 0937   CREATININE 0.84 06/22/2024 1355   CREATININE 0.72 11/30/2016 0926   CALCIUM 8.8 06/22/2024 1355   GFRNONAA >60 06/19/2022 0535   GFRNONAA >89 11/30/2016 0926   GFRAA 96 12/08/2018 1624   GFRAA >89 11/30/2016 0926   Lab Results  Component Value Date   HGBA1C 5.3 04/06/2024   HGBA1C 5.5 01/13/2014   Lab Results  Component Value Date   INSULIN  15.3 04/06/2024   INSULIN  12.4 03/12/2023   Lab Results  Component Value Date   TSH 1.58 06/22/2024   CBC    Component Value Date/Time   WBC 5.1 06/22/2024 1355   RBC 4.58 06/22/2024 1355   HGB 13.9 06/22/2024 1355   HGB 12.7 03/12/2023 1033   HCT 42.1 06/22/2024 1355   HCT 39.1 03/12/2023 1033   PLT  256.0 06/22/2024 1355   PLT 334 03/12/2023 1033   MCV 92.0 06/22/2024 1355   MCV 83 03/12/2023 1033   MCH 26.9 03/12/2023 1033   MCH 26.3 06/19/2022 0535   MCHC 33.0 06/22/2024 1355   RDW 13.4 06/22/2024 1355   RDW 14.5 03/12/2023 1033   Iron Studies    Component Value Date/Time   IRON 44 12/19/2022 0936   TIBC 372 12/19/2022 0936   FERRITIN 5 (L) 12/19/2022 0936   IRONPCTSAT 12 (L) 12/19/2022 0936   Lipid Panel     Component Value Date/Time   CHOL 127 06/22/2024 1355   CHOL 123 03/12/2023 1033   TRIG 50.0 06/22/2024 1355   HDL 48.50 06/22/2024 1355   HDL 50 03/12/2023 1033   CHOLHDL 3 06/22/2024 1355   VLDL 10.0 06/22/2024 1355   LDLCALC 69 06/22/2024 1355   LDLCALC 62 03/12/2023 1033   Hepatic Function Panel     Component Value Date/Time   PROT 7.6 06/22/2024 1355   PROT 7.4 04/06/2024 0937   ALBUMIN 3.8 06/22/2024 1355   ALBUMIN 3.8 (L) 04/06/2024 0937   AST 14 06/22/2024 1355   ALT 14 06/22/2024 1355   ALKPHOS 69 06/22/2024 1355   BILITOT 0.3 06/22/2024 1355   BILITOT 0.2 04/06/2024 0937      Component Value Date/Time   TSH 1.58 06/22/2024 1355   Nutritional Lab Results  Component Value Date   VD25OH 51.3 04/06/2024   VD25OH 52.5 10/23/2023   VD25OH 57.1 07/01/2023     Follow up:   Return 10/26/2024 at 10:40 AM.  She was informed of the importance of frequent follow up visits to maximize her success with intensive lifestyle modifications for her multiple health conditions.   Attestations:   I, Special Puri, acting as a stage manager for [employer], DO., have compiled all relevant documentation for today's office visit on behalf of Cynthia Jenkins, DO, while in the presence of [employer], DO.  Pertinent positives were addressed with patient today. Reviewed by clinician on day of visit: allergies, medications, problem list, medical history, surgical history, family history, social history, and previous encounter notes.   I have reviewed  the above documentation for accuracy and completeness, and I agree with the above. Cynthia Cynthia [name], D.O.  The 21st Century Cures Act was signed into law in 2016 which includes the topic of electronic health records.  This provides immediate access to information in MyChart. This includes consultation notes, operative notes, office notes, lab  results and pathology reports.  If you have any questions about what you read please let us  know at your next visit so we can discuss your concerns and take corrective action if need be.  We are right here with you.

## 2024-10-08 ENCOUNTER — Other Ambulatory Visit: Payer: Self-pay | Admitting: Podiatry

## 2024-10-12 ENCOUNTER — Ambulatory Visit: Admitting: Physician Assistant

## 2024-10-19 ENCOUNTER — Ambulatory Visit: Admitting: Physician Assistant

## 2024-10-26 ENCOUNTER — Encounter: Payer: Self-pay | Admitting: Podiatry

## 2024-10-26 ENCOUNTER — Ambulatory Visit: Admitting: Podiatry

## 2024-10-26 ENCOUNTER — Telehealth: Payer: Self-pay

## 2024-10-26 ENCOUNTER — Ambulatory Visit (INDEPENDENT_AMBULATORY_CARE_PROVIDER_SITE_OTHER): Admitting: Family Medicine

## 2024-10-26 ENCOUNTER — Ambulatory Visit (INDEPENDENT_AMBULATORY_CARE_PROVIDER_SITE_OTHER): Admitting: Podiatry

## 2024-10-26 ENCOUNTER — Other Ambulatory Visit (HOSPITAL_COMMUNITY): Payer: Self-pay

## 2024-10-26 DIAGNOSIS — M722 Plantar fascial fibromatosis: Secondary | ICD-10-CM | POA: Diagnosis not present

## 2024-10-26 MED ORDER — MELOXICAM 15 MG PO TABS: 15.0000 mg | ORAL_TABLET | Freq: Every day | ORAL | 0 refills | Status: AC

## 2024-10-26 NOTE — Progress Notes (Signed)
  Subjective:  Patient ID: Cynthia [name], female    DOB: [birth year],   MRN: [id number]  Chief Complaint  Patient presents with   Plantar Fasciitis    They're much better since I got the shots.  My ankles are better since I wear the braces.  I take the Meloxicam  daily.  My right one is the worst but I think it's coming from the pain I have in my knee.  I need a knee replacement.  I need a refill of my Meloxicam .    43 y.o. female present for concern of bilateral plantar fasciitis.  Relates doing a lot better.  She relates she is about 95% better she has been wearing the braces and taking the meloxicam  and these helped.  She is hoping to get a refill of the meloxicam .  She is also here to pick up her orthotics as well.  Denies any other pedal complaints. Denies n/v/f/c.   Past Medical History:  Diagnosis Date   Allergy 11/20   Anxiety    Arthritis    Asthma    childhood   Asthma    Bilateral swelling of feet    Constipation    Depression 10/22/21   Fatigue    Fibromyalgia 2007   High blood pressure    Hypertension    on and off since 2007   Joint pain    Multiple food allergies    Osteoarthritis    Osteoarthritis    Palpitations    Prediabetes    Seasonal allergies    SOB (shortness of breath)    Vitamin D  deficiency     Objective:  Physical Exam: Vascular: DP/PT pulses 2/4 bilateral. CFT <3 seconds. Normal hair growth on digits. No edema.  Skin. No lacerations or abrasions bilateral feet.  Musculoskeletal: MMT 5/5 bilateral lower extremities in DF, PF, Inversion and Eversion. Deceased ROM in DF of ankle joint.  Minimally tender to the medial calcaneal tubercle bilaterally . No pain with achilles, PT or arch. No pain with calcaneal squeeze.  Neurological: Sensation intact to light touch.   Assessment:   1. Plantar fasciitis, bilateral        Plan:  Patient was evaluated and treated and all questions answered. Discussed plantar fasciitis with patient.  X-rays  reviewed and discussed with patient. No acute fractures or dislocations noted. Mild spurring noted at inferior calcaneus.  Discussed treatment options including, ice, NSAIDS, supportive shoes, bracing, and stretching.  Continue stretching. Continue anti-inflammatories.Meloxicam  refilled Injection offered procedure below.  Picking up orthotics.  Discussed break-in period and care of orthotics.  Advised to call if having any issues. Follow-up as needed  Asberry Failing, DPM

## 2024-10-26 NOTE — Telephone Encounter (Signed)
 Pharmacy Patient Advocate Encounter   Received notification from Onbase that prior authorization for Fluticasone -Salmeterol 115-21MCG/ACT aerosol is required/requested.   Insurance verification completed.   The patient is insured through [facility].   Per test claim: Refill too soon. PA is not needed at this time. Medication was filled 10/26/24. Next eligible fill date is 11/18/24.  -Pharmacy filled brand name covered by insurance.

## 2024-10-29 ENCOUNTER — Ambulatory Visit (INDEPENDENT_AMBULATORY_CARE_PROVIDER_SITE_OTHER): Admitting: Gastroenterology

## 2024-10-29 ENCOUNTER — Encounter: Payer: Self-pay | Admitting: Gastroenterology

## 2024-10-29 VITALS — BP 126/70 | HR 90 | Ht 66.0 in | Wt 333.0 lb

## 2024-10-29 DIAGNOSIS — Z6841 Body Mass Index (BMI) 40.0 and over, adult: Secondary | ICD-10-CM | POA: Diagnosis not present

## 2024-10-29 DIAGNOSIS — E669 Obesity, unspecified: Secondary | ICD-10-CM | POA: Diagnosis not present

## 2024-10-29 DIAGNOSIS — R1013 Epigastric pain: Secondary | ICD-10-CM | POA: Diagnosis not present

## 2024-10-29 DIAGNOSIS — R1114 Bilious vomiting: Secondary | ICD-10-CM

## 2024-10-29 DIAGNOSIS — R112 Nausea with vomiting, unspecified: Secondary | ICD-10-CM | POA: Diagnosis not present

## 2024-10-29 DIAGNOSIS — Z1211 Encounter for screening for malignant neoplasm of colon: Secondary | ICD-10-CM

## 2024-10-29 DIAGNOSIS — R1310 Dysphagia, unspecified: Secondary | ICD-10-CM | POA: Diagnosis not present

## 2024-10-29 DIAGNOSIS — R1319 Other dysphagia: Secondary | ICD-10-CM

## 2024-10-29 DIAGNOSIS — K219 Gastro-esophageal reflux disease without esophagitis: Secondary | ICD-10-CM

## 2024-10-29 DIAGNOSIS — Z860100 Personal history of colon polyps, unspecified: Secondary | ICD-10-CM | POA: Diagnosis not present

## 2024-10-29 MED ORDER — NA SULFATE-K SULFATE-MG SULF 17.5-3.13-1.6 GM/177ML PO SOLN: 1.0000 | Freq: Once | ORAL | 0 refills | Status: AC

## 2024-10-29 NOTE — Patient Instructions (Addendum)
 _______________________________________________________  If your blood pressure at your visit was 140/90 or greater, please contact your primary care physician to follow up on this.  _______________________________________________________  If you are age 45 or older, your body mass index should be between 23-30. Your Body mass index is 53.75 kg/m. If this is out of the aforementioned range listed, please consider follow up with your Primary Care Provider.  If you are age 83 or younger, your body mass index should be between 19-25. Your Body mass index is 53.75 kg/m. If this is out of the aformentioned range listed, please consider follow up with your Primary Care Provider.   ________________________________________________________  The [facility] providers would like to encourage you to use MYCHART to communicate with providers for non-urgent requests or questions.  Due to long hold times on the telephone, sending your provider a message by [facility] may be a faster and more efficient way to get a response.  Please allow 48 business hours for a response.  Please remember that this is for non-urgent requests.  _______________________________________________________  [facility] is using a team-based approach to care.  Your team is made up of your doctor and two to three APPS. Our APPS (Nurse Practitioners and Physician Assistants) work with your physician to ensure care continuity for you. They are fully qualified to address your health concerns and develop a treatment plan. They communicate directly with your gastroenterologist to care for you. Seeing the Advanced Practice Practitioners on your physician's team can help you by facilitating care more promptly, often allowing for earlier appointments, access to diagnostic testing, procedures, and other specialty referrals.   You have been scheduled for an endoscopy and colonoscopy. Please follow the written instructions given to you at  your visit today.  If you use inhalers (even only as needed), please bring them with you on the day of your procedure.  DO NOT TAKE 7 DAYS PRIOR TO TEST- Trulicity (dulaglutide) Ozempic, Wegovy (semaglutide) Mounjaro , Zepbound  (tirzepatide ) Bydureon Bcise (exanatide extended release)  DO NOT TAKE 1 DAY PRIOR TO YOUR TEST Rybelsus (semaglutide) Adlyxin (lixisenatide) Victoza (liraglutide) Byetta (exanatide) ___________________________________________________________________________  Due to recent changes in healthcare laws, you may see the results of your imaging and laboratory studies on MyChart before your provider has had a chance to review them.  We understand that in some cases there may be results that are confusing or concerning to you. Not all laboratory results come back in the same time frame and the provider may be waiting for multiple results in order to interpret others.  Please give us  48 hours in order for your provider to thoroughly review all the results before contacting the office for clarification of your results.   It was a pleasure to see you today!  Vito Cirigliano, D.O.

## 2024-10-29 NOTE — Progress Notes (Signed)
 Chief Complaint: Dysphagia   Referring Provider:     Allwardt, Mardy HERO, PA-C    HPI:     Cynthia [name] is a 43 y.o. female with a history of HTN, osteoarthritis, obesity (BMI 53), asthma, fibromyalgia, referred to the [facility] for evaluation of dysphagia.  Has been having dysphagia to pills for the last 3 months. No issue with liquids. Can have episodic solid food dysphagia with certain foods as well (ie, beef, apples, etc).  Tends to stay with softer foods due to sxs. Separately, can have sour stomach, regurgitation, and episodes of nausea and bilious emesis. Has tried OTC Pepcid on demand without change.    Was seen in the [facility] ER 3-4 weeks ago per patient (although no record in EMR).   Separately, due to for CRC screening. Thinks she may have had colonoscopy at some point in the past at [facility] and notable for polyps. No records available for review. No lower GI sxs.   Following the [facility] and started on tirzepatide  (Mounjaro ).  Paternal uncles x2 with Ulcerative Colitis and PGF with Crohns Disease. No known family history of CRC, GI malignancy, liver disease, pancreatic disease.        Latest Ref Rng & Units 06/22/2024    1:55 PM 03/12/2023   10:33 AM 12/19/2022    9:36 AM  CBC  WBC 4.0 - 10.5 K/uL 5.1  5.6  5.3   Hemoglobin 12.0 - 15.0 g/dL 86.0  87.2  87.6   Hematocrit 36.0 - 46.0 % 42.1  39.1  37.7   Platelets 150.0 - 400.0 K/uL 256.0  334  331.0       Latest Ref Rng & Units 06/22/2024    1:55 PM 04/06/2024    9:37 AM 10/23/2023    8:06 AM  CMP  Glucose 70 - 99 mg/dL 84  77  83   BUN 6 - 23 mg/dL 15  18  12    Creatinine 0.40 - 1.20 mg/dL 9.15  9.16  9.08   Sodium 135 - 145 mEq/L 136  137  139   Potassium 3.5 - 5.1 mEq/L 4.2  4.1  4.6   Chloride 96 - 112 mEq/L 103  103  103   CO2 19 - 32 mEq/L 26  20  23    Calcium 8.4 - 10.5 mg/dL 8.8  8.8  8.5   Total Protein 6.0 - 8.3 g/dL 7.6  7.4  7.2    Total Bilirubin 0.2 - 1.2 mg/dL 0.3  0.2  0.4   Alkaline Phos 39 - 117 U/L 69  85  90   AST 0 - 37 U/L 14  19  18    ALT 0 - 35 U/L 14  20  22        Past Medical History:  Diagnosis Date   Allergy 11/20   Anxiety    Arthritis    Asthma    childhood   Asthma    Bilateral swelling of feet    Constipation    Depression 10/22/21   Fatigue    Fibromyalgia 2007   High blood pressure    Hypertension    on and off since 2007   Joint pain    Multiple food allergies    Osteoarthritis    Osteoarthritis    Palpitations    Prediabetes    Seasonal allergies    SOB (shortness of breath)  Vitamin D  deficiency      History reviewed. No pertinent surgical history. Family History  Problem Relation Age of Onset   Arthritis Mother    Hypertension Mother    Obesity Mother    Heart disease Father    Hyperlipidemia Father    Alcohol abuse Father    Arthritis Father    Asthma Father    COPD Father    Hypertension Father    Heart failure Father    Liver disease Father    Alcoholism Father    Breast cancer Maternal Aunt    Breast cancer Maternal Aunt    Breast cancer Paternal Aunt    Breast cancer Paternal Aunt    Breast cancer Maternal Grandmother    Early death Maternal Grandmother    Hypertension Maternal Grandmother    Pneumonia Maternal Grandmother    Hearing loss Maternal Grandfather    Hypertension Maternal Grandfather    Stroke Maternal Grandfather    Vision loss Maternal Grandfather    Hypertension Paternal Grandmother    Heart disease Paternal Grandmother    Hypertension Paternal Grandfather    Heart failure Paternal Grandfather    Vision loss Paternal Grandfather    Breast cancer Cousin    Breast cancer Cousin    Breast cancer Cousin    Social History[1] Current Outpatient Medications  Medication Sig Dispense Refill   albuterol  (VENTOLIN  HFA) 108 (90 Base) MCG/ACT inhaler USE 2 INHALATIONS BY MOUTH  EVERY 4 HOURS AS NEEDED FOR WHEEZING OR SHORTNESS OF   BREATH 25.5 g 1   amLODipine  (NORVASC ) 5 MG tablet Take 1 tablet (5 mg total) by mouth daily. 90 tablet 3   cyanocobalamin  (VITAMIN B12) 500 MCG tablet Take 2 tablets (1,000 mcg total) by mouth daily.     cyclobenzaprine  (FLEXERIL ) 5 MG tablet Take 1 tablet (5 mg total) by mouth 3 (three) times daily as needed for muscle spasms. 90 tablet 3   DULoxetine  (CYMBALTA ) 60 MG capsule Take 1 capsule (60 mg total) by mouth daily. 90 capsule 3   ferrous sulfate  325 (65 FE) MG tablet Take 1 tablet (325 mg total) by mouth every other day. 30 tablet 3   fluticasone -salmeterol (ADVAIR HFA) 115-21 MCG/ACT inhaler Inhale 2 puffs into the lungs 2 (two) times daily. Rinse mouth after use. 1 each 12   gabapentin  (NEURONTIN ) 300 MG capsule Take 1 capsule (300 mg total) by mouth 3 (three) times daily. 270 capsule 0   ibuprofen  (ADVIL ) 800 MG tablet Take 1 tablet (800 mg total) by mouth every 8 (eight) hours as needed. Take with food. 30 tablet 2   irbesartan -hydrochlorothiazide  (AVALIDE) 150-12.5 MG tablet 1 TAB PO Q AM 30 tablet 0   meloxicam  (MOBIC ) 15 MG tablet Take 1 tablet (15 mg total) by mouth daily. 30 tablet 0   metFORMIN  (GLUCOPHAGE ) 500 MG tablet 2 po with lunch and 2 dinner daily 120 tablet 1   metoprolol  succinate (TOPROL -XL) 25 MG 24 hr tablet Take 1 tablet by mouth once daily 90 tablet 0   polyethylene glycol powder (GLYCOLAX /MIRALAX ) 17 GM/SCOOP powder 17GR TWICE DAILY until stooling regularly, then qd     Sennosides 8.6 MG CAPS      SUMAtriptan  (IMITREX ) 50 MG tablet Take 1 tab (50mg ) orally at the onset of her migraine, may repeat in 2 hours if migraine persists.  Total daily maximum dose is 200 mg 10 tablet 4   tirzepatide  (MOUNJARO ) 2.5 MG/0.5ML Pen Inject 2.5 mg into the skin once a week.  2 mL 0   topiramate  (TOPAMAX ) 100 MG tablet TAKE 1 & 1/2 (ONE & ONE-HALF) TABLETS BY MOUTH TWICE DAILY FOR MIGRAINE PREVENTION 270 tablet 0   traZODone  (DESYREL ) 100 MG tablet Take 1 tablet (100 mg total) by  mouth at bedtime as needed. for sleep 90 tablet 3   Vitamin D , Ergocalciferol , (DRISDOL ) 1.25 MG (50000 UNIT) CAPS capsule 1 po q Wed and 1 po q Sun 24 capsule 0   tirzepatide  (ZEPBOUND ) 2.5 MG/0.5ML injection vial Inject 2.5 mg into the skin once a week. (Patient not taking: Reported on 10/29/2024) 2 mL 0   No current facility-administered medications for this visit.   Allergies[2]   Review of Systems: All systems reviewed and negative except where noted in HPI.     Physical Exam:    Wt Readings from Last 3 Encounters:  10/29/24 (!) 333 lb (151 kg)  10/07/24 (!) 332 lb (150.6 kg)  09/14/24 (!) 333 lb (151 kg)    BP 126/70   Pulse 90   Ht 5' 6 (1.676 m)   Wt (!) 333 lb (151 kg)   LMP 10/12/2024 (Exact Date)   BMI 53.75 kg/m  Constitutional:  Pleasant, in no acute distress. Psychiatric: Normal mood and affect. Behavior is normal. Cardiovascular: Normal rate, regular rhythm. No edema Pulmonary/chest: Effort normal and breath sounds normal. No wheezing, rales or rhonchi. Abdominal: Soft, nondistended, nontender. Bowel sounds active throughout. There are no masses palpable. No hepatomegaly. Neurological: Alert and oriented to person place and time. Skin: Skin is warm and dry. No rashes noted.   ASSESSMENT AND PLAN;   1) Dysphagia - Plan for EGD with dilation and/or biopsies as appropriate - Advised patient to cut food into small pieces, eat small bites, chew food thoroughly and with plenty of liquids to avoid food impaction.  2) Dyspepsia 3) Regurgitation 4) Nausea/vomiting - Evaluate for mucosal/luminal pathology at time of EGD as above with gastric biopsies - Discussed role/utility of empiric trial of acid suppression therapy.  Would prefer holding off on initiating new medication until completion of endoscopy first  5) Colon cancer screening - Due for age-appropriate CRC screening and polyp surveillance - Will try to obtain prior colonoscopy report from [facility] - Schedule colonoscopy  6) Obesity - Hold Mounjaro  7 days prior to procedures - Procedures to be scheduled at hospital endoscopy unit due to elevated periprocedural risks from underlying comorbidities, including BMI>50   The indications, risks, and benefits of EGD and colonoscopy were explained to the patient in detail. Risks include but are not limited to bleeding, perforation, adverse reaction to medications, and cardiopulmonary compromise. Sequelae include but are not limited to the possibility of surgery, hospitalization, and mortality. The patient verbalized understanding and wished to proceed. All questions answered, referred to scheduler and bowel prep ordered. Further recommendations pending results of the exam.     Sandor LULLA Flatter, DO, FACG  10/29/2024, 9:43 AM   Allwardt, Mardy HERO, PA-C     [1]  Social History Tobacco Use   Smoking status: Never   Smokeless tobacco: Never  Vaping Use   Vaping status: Never Used  Substance Use Topics   Alcohol use: No   Drug use: No  [2]  Allergies Allergen Reactions   Prochlorperazine  Anxiety and Itching    prochlorperazine    Bovine (Beef) Protein-Containing Drug Products Other (See Comments)   Porcine (Pork) Protein-Containing Drug Products Other (See Comments)   Justicia Adhatoda Hives and Other (See Comments)    Tree  nuts  Justicia adhatoda leaf extract   Shellfish Allergy Rash

## 2024-11-10 ENCOUNTER — Encounter (INDEPENDENT_AMBULATORY_CARE_PROVIDER_SITE_OTHER): Payer: Self-pay | Admitting: Adult Health

## 2024-11-10 ENCOUNTER — Ambulatory Visit (INDEPENDENT_AMBULATORY_CARE_PROVIDER_SITE_OTHER): Admitting: Adult Health

## 2024-11-10 ENCOUNTER — Ambulatory Visit: Admitting: Physician Assistant

## 2024-11-10 VITALS — BP 136/83 | HR 83 | Temp 97.5°F | Ht 66.0 in | Wt 321.0 lb

## 2024-11-10 DIAGNOSIS — E559 Vitamin D deficiency, unspecified: Secondary | ICD-10-CM

## 2024-11-10 DIAGNOSIS — Z6841 Body Mass Index (BMI) 40.0 and over, adult: Secondary | ICD-10-CM

## 2024-11-10 DIAGNOSIS — R7303 Prediabetes: Secondary | ICD-10-CM

## 2024-11-10 DIAGNOSIS — E669 Obesity, unspecified: Secondary | ICD-10-CM | POA: Diagnosis not present

## 2024-11-10 DIAGNOSIS — E538 Deficiency of other specified B group vitamins: Secondary | ICD-10-CM | POA: Diagnosis not present

## 2024-11-10 MED ORDER — METFORMIN HCL 500 MG PO TABS: ORAL_TABLET | ORAL | 1 refills | Status: DC

## 2024-11-10 MED ORDER — ZEPBOUND 2.5 MG/0.5ML ~~LOC~~ SOLN: 2.5000 mg | SUBCUTANEOUS | 0 refills | Status: DC

## 2024-11-10 MED ORDER — VITAMIN D (ERGOCALCIFEROL) 1.25 MG (50000 UNIT) PO CAPS: ORAL_CAPSULE | ORAL | 0 refills | Status: AC

## 2024-11-10 MED ORDER — TIRZEPATIDE 2.5 MG/0.5ML ~~LOC~~ SOAJ: 2.5000 mg | SUBCUTANEOUS | 0 refills | Status: DC

## 2024-11-10 NOTE — Progress Notes (Signed)
 "    WEIGHT SUMMARY AND BIOMETRICS  Vitals Temp: (!) 97.5 F (36.4 C) BP: 136/83 Pulse Rate: 83 SpO2: 99 %   Anthropometric Measurements Height: 5' 6 (1.676 m) Weight: (!) 321 lb (145.6 kg) BMI (Calculated): 51.84 Weight at Last Visit: 332lb Weight Lost Since Last Visit: 11lb Weight Gained Since Last Visit: 0lb Starting Weight: 353lb Total Weight Loss (lbs): 32 lb (14.5 kg)   Body Composition  Body Fat %: 55.8 % Fat Mass (lbs): 179.2 lbs Muscle Mass (lbs): 134.8 lbs Total Body Water (lbs): 109.4 lbs Visceral Fat Rating : 20   Other Clinical Data RMR: [id number] Fasting: Yes Labs: No Today's Visit #: 18 Starting Date: 03/12/23    Chief Complaint:   OBESITY Cynthia [name] is here to discuss her progress with her obesity treatment plan.  She is on the keeping a food journal and adhering to recommended goals of 1400-1500 calories and 100g+ protein and states she is following her eating plan approximately 95 % of the time.  She states she is exercising Walking 30 minutes 5-6 times per week.  Interim History:  Current BMI 51.8 Goal is to achieve BMI < 36 to become eligible for R TKA  03/26/2023 [facility] OV Note- She has been off Metformin  since September 2023.  04/22/2023- restarted on Metformin  therapy She is currently on Metformin  500mg  2 tabs BID= 2000mg /day  She has been discussing GIP/GLP-1 therapy at length with [facility] provider. She was provided Rx for Mounjaro  2.5mg  and Zepbound  2.5mg  Her insurance covered Mounjaro  2.5mg  She has had 4 injections Denies mass in neck, dysphagia, dyspepsia, persistent hoarseness, abdominal pain, or N/V/C   Lengthy discussion: Mounjaro  vs Zepbound  indications. She has been prediabetic for years, never fully converted into T2D She endorses family hx of diabetes, re: her father No evidence in EPIC of A1c > 6.5  Will refill both GIP/GLP-1 Rxs- obtain whichever is most cost effective.  Of Note- She is in perimenopause Per pt I was told in my  mid 25s that I am infertile  Subjective:   1. Prediabetes Lab Results  Component Value Date   HGBA1C 5.3 04/06/2024   HGBA1C 5.7 (H) 10/23/2023   HGBA1C 6.0 (H) 07/01/2023    03/26/2023 [facility] OV Note- She has been off Metformin  since September 2023.  04/22/2023- restarted on Metformin  therapy She is currently on Metformin  500mg  2 tabs BID= 2000mg /day  She has been discussing GIP/GLP-1 therapy at length with [facility] provider. She was provided Rx for Mounjaro  2.5mg  and Zepbound  2.5mg  Her insurance covered Mounjaro  2.5mg  She has had 4 injections Denies mass in neck, dysphagia, dyspepsia, persistent hoarseness, abdominal pain, or N/V/C   Lengthy discussion: Mounjaro  vs Zepbound  indications. She has been prediabetic for years, never fully converted into T2D She endorses family hx of diabetes, re: her father No evidence in EPIC of A1c > 6.5  Will refill both GIP/GLP-1 Rxs- obtain whichever is most cost effective.  Of Note- She is in perimenopause Per pt I was told in my mid 32s that I am infertile  2. Vitamin B12 deficiency  Latest Reference Range & Units 03/12/23 10:33 07/01/23 09:54 10/23/23 08:06 04/06/24 09:37  Vitamin B12 232 - 1,245 pg/mL 298 477 351 377    3. Vitamin D  deficiency  Latest Reference Range & Units 03/12/23 10:33 07/01/23 09:54 10/23/23 08:06 04/06/24 09:37  Vitamin D , 25-Hydroxy 30.0 - 100.0 ng/mL 19.2 (L) 57.1 52.5 51.3  (L): Data is abnormally low  Vit D Level has improved and at goal  Assessment/Plan:   1. Prediabetes Refill - metFORMIN  (GLUCOPHAGE ) 500 MG tablet; 2 po with lunch and 2 dinner daily  Dispense: 120 tablet; Refill: 1 Refill - tirzepatide  (MOUNJARO ) 2.5 MG/0.5ML Pen; Inject 2.5 mg into the skin once a week.  Dispense: 2 mL; Refill: 0 - tirzepatide  (ZEPBOUND ) 2.5 MG/0.5ML injection vial; Inject 2.5 mg into the skin once a week.  Dispense: 2 mL; Refill: 0  Will refill both GIP/GLP-1 Rxs- obtain whichever is most cost effective. Pt  verbalized understanding and agreement  2. Vitamin B12 deficiency Check Labs at next OV  3. Vitamin D  deficiency Refill - Vitamin D , Ergocalciferol , (DRISDOL ) 1.25 MG (50000 UNIT) CAPS capsule; 1 po q Wed and 1 po q Sun  Dispense: 24 capsule; Refill: 0  4. BMI 50.0-59.9, adult (HCC) Current BMI 51.8 (Primary) Refill - metFORMIN  (GLUCOPHAGE ) 500 MG tablet; 2 po with lunch and 2 dinner daily  Dispense: 120 tablet; Refill: 1 Refill - tirzepatide  (MOUNJARO ) 2.5 MG/0.5ML Pen; Inject 2.5 mg into the skin once a week.  Dispense: 2 mL; Refill: 0 - tirzepatide  (ZEPBOUND ) 2.5 MG/0.5ML injection vial; Inject 2.5 mg into the skin once a week.  Dispense: 2 mL; Refill: 0  Will refill both GIP/GLP-1 Rxs- obtain whichever is most cost effective. Pt verbalized understanding and agreement  Cynthia [name] is currently in the action stage of change. As such, her goal is to continue with weight loss efforts. She has agreed to keeping a food journal and adhering to recommended goals of 1400-1500 calories and 100g+ protein.   Exercise goals: All adults should avoid inactivity. Some physical activity is better than none, and adults who participate in any amount of physical activity gain some health benefits. Adults should also include muscle-strengthening activities that involve all major muscle groups on 2 or more days a week. Increase daily walking  Behavioral modification strategies: increasing lean protein intake, decreasing simple carbohydrates, increasing vegetables, increasing water intake, increasing high fiber foods, no skipping meals, meal planning and cooking strategies, keeping healthy foods in the home, ways to avoid boredom eating, holiday eating strategies , celebration eating strategies, and planning for success.  Cynthia [name] has agreed to follow-up with our clinic in 4 weeks. She was informed of the importance of frequent follow-up visits to maximize her success with intensive lifestyle modifications for her  multiple health conditions.   Check Fasting Labs at next OV.  Objective:   Blood pressure 136/83, pulse 83, temperature (!) 97.5 F (36.4 C), height 5' 6 (1.676 m), weight (!) 321 lb (145.6 kg), last menstrual period 10/12/2024, SpO2 99%. Body mass index is 51.81 kg/m.  General: Cooperative, alert, well developed, in no acute distress. HEENT: Conjunctivae and lids unremarkable. Cardiovascular: Regular rhythm.  Lungs: Normal work of breathing. Neurologic: No focal deficits.   Lab Results  Component Value Date   CREATININE 0.84 06/22/2024   BUN 15 06/22/2024   NA 136 06/22/2024   K 4.2 06/22/2024   CL 103 06/22/2024   CO2 26 06/22/2024   Lab Results  Component Value Date   ALT 14 06/22/2024   AST 14 06/22/2024   ALKPHOS 69 06/22/2024   BILITOT 0.3 06/22/2024   Lab Results  Component Value Date   HGBA1C 5.3 04/06/2024   HGBA1C 5.7 (H) 10/23/2023   HGBA1C 6.0 (H) 07/01/2023   HGBA1C 5.9 (H) 03/12/2023   HGBA1C 5.6 09/17/2022   Lab Results  Component Value Date   INSULIN  15.3 04/06/2024   INSULIN  14.0 10/23/2023   INSULIN  13.7 07/01/2023  INSULIN  12.4 03/12/2023   Lab Results  Component Value Date   TSH 1.58 06/22/2024   Lab Results  Component Value Date   CHOL 127 06/22/2024   HDL 48.50 06/22/2024   LDLCALC 69 06/22/2024   TRIG 50.0 06/22/2024   CHOLHDL 3 06/22/2024   Lab Results  Component Value Date   VD25OH 51.3 04/06/2024   VD25OH 52.5 10/23/2023   VD25OH 57.1 07/01/2023   Lab Results  Component Value Date   WBC 5.1 06/22/2024   HGB 13.9 06/22/2024   HCT 42.1 06/22/2024   MCV 92.0 06/22/2024   PLT 256.0 06/22/2024   Lab Results  Component Value Date   IRON 44 12/19/2022   TIBC 372 12/19/2022   FERRITIN 5 (L) 12/19/2022   Attestation Statements:   Reviewed by clinician on day of visit: allergies, medications, problem list, medical history, surgical history, family history, social history, and previous encounter notes.  I have  reviewed the above documentation for accuracy and completeness, and I agree with the above. -  Carvel Huskins d. Yishai Rehfeld, NP-C "

## 2024-11-23 ENCOUNTER — Encounter (HOSPITAL_COMMUNITY): Payer: Self-pay | Admitting: Gastroenterology

## 2024-11-23 ENCOUNTER — Ambulatory Visit (INDEPENDENT_AMBULATORY_CARE_PROVIDER_SITE_OTHER): Admitting: Family Medicine

## 2024-11-23 ENCOUNTER — Ambulatory Visit: Admitting: Physician Assistant

## 2024-12-01 ENCOUNTER — Telehealth: Payer: Self-pay

## 2024-12-01 NOTE — Telephone Encounter (Signed)
 Procedure:EGD Procedure date: 12/08/24 Procedure location: [facility] Arrival Time: 8:11 Spoke with the patient Y/N: N Any prep concerns? N Has the patient obtained the prep from the pharmacy ? N Do you have a care partner and transportation: N Any additional concerns? N  I called patient and I was unable to reach the patient. I left a detailed message about the procedure and left the office number in case the patient has questions are concerns.

## 2024-12-02 NOTE — Telephone Encounter (Signed)
 Refer to [facility]. Pt confirmed appt.

## 2024-12-02 NOTE — Telephone Encounter (Signed)
 Left detailed message for patient to call back & confirm procedure. Mychart message sent also.

## 2024-12-02 NOTE — Telephone Encounter (Signed)
 Patient calling to confirm procedure date. Please advise  Thank you

## 2024-12-03 ENCOUNTER — Ambulatory Visit (INDEPENDENT_AMBULATORY_CARE_PROVIDER_SITE_OTHER): Admitting: Family Medicine

## 2024-12-08 ENCOUNTER — Ambulatory Visit (HOSPITAL_COMMUNITY)
Admission: RE | Admit: 2024-12-08 | Discharge: 2024-12-08 | Disposition: A | Discharge status: Home / Self Care | Attending: Gastroenterology | Admitting: Gastroenterology

## 2024-12-08 ENCOUNTER — Other Ambulatory Visit: Payer: Self-pay | Admitting: Physician Assistant

## 2024-12-08 ENCOUNTER — Ambulatory Visit (HOSPITAL_COMMUNITY): Admitting: Certified Registered"

## 2024-12-08 ENCOUNTER — Encounter (HOSPITAL_COMMUNITY)
Admission: RE | Disposition: A | Discharge status: Home / Self Care | Payer: Self-pay | Source: Home / Self Care | Attending: Gastroenterology

## 2024-12-08 ENCOUNTER — Encounter (HOSPITAL_COMMUNITY): Payer: Self-pay | Admitting: Gastroenterology

## 2024-12-08 DIAGNOSIS — M797 Fibromyalgia: Secondary | ICD-10-CM | POA: Diagnosis not present

## 2024-12-08 DIAGNOSIS — R1013 Epigastric pain: Secondary | ICD-10-CM

## 2024-12-08 DIAGNOSIS — K6289 Other specified diseases of anus and rectum: Secondary | ICD-10-CM | POA: Diagnosis not present

## 2024-12-08 DIAGNOSIS — F418 Other specified anxiety disorders: Secondary | ICD-10-CM | POA: Diagnosis not present

## 2024-12-08 DIAGNOSIS — R519 Headache, unspecified: Secondary | ICD-10-CM | POA: Insufficient documentation

## 2024-12-08 DIAGNOSIS — R1310 Dysphagia, unspecified: Secondary | ICD-10-CM | POA: Diagnosis present

## 2024-12-08 DIAGNOSIS — I10 Essential (primary) hypertension: Secondary | ICD-10-CM

## 2024-12-08 DIAGNOSIS — R1110 Vomiting, unspecified: Secondary | ICD-10-CM

## 2024-12-08 DIAGNOSIS — F32A Depression, unspecified: Secondary | ICD-10-CM | POA: Insufficient documentation

## 2024-12-08 DIAGNOSIS — K219 Gastro-esophageal reflux disease without esophagitis: Secondary | ICD-10-CM

## 2024-12-08 DIAGNOSIS — F419 Anxiety disorder, unspecified: Secondary | ICD-10-CM | POA: Insufficient documentation

## 2024-12-08 DIAGNOSIS — R112 Nausea with vomiting, unspecified: Secondary | ICD-10-CM

## 2024-12-08 DIAGNOSIS — Z1211 Encounter for screening for malignant neoplasm of colon: Secondary | ICD-10-CM | POA: Diagnosis not present

## 2024-12-08 DIAGNOSIS — D649 Anemia, unspecified: Secondary | ICD-10-CM | POA: Insufficient documentation

## 2024-12-08 DIAGNOSIS — K644 Residual hemorrhoidal skin tags: Secondary | ICD-10-CM | POA: Diagnosis not present

## 2024-12-08 DIAGNOSIS — J45909 Unspecified asthma, uncomplicated: Secondary | ICD-10-CM | POA: Insufficient documentation

## 2024-12-08 DIAGNOSIS — M1990 Unspecified osteoarthritis, unspecified site: Secondary | ICD-10-CM | POA: Diagnosis not present

## 2024-12-08 DIAGNOSIS — Z6841 Body Mass Index (BMI) 40.0 and over, adult: Secondary | ICD-10-CM | POA: Insufficient documentation

## 2024-12-08 DIAGNOSIS — K2950 Unspecified chronic gastritis without bleeding: Secondary | ICD-10-CM

## 2024-12-08 DIAGNOSIS — E6689 Other obesity not elsewhere classified: Secondary | ICD-10-CM | POA: Insufficient documentation

## 2024-12-08 DIAGNOSIS — R1319 Other dysphagia: Secondary | ICD-10-CM

## 2024-12-08 DIAGNOSIS — Z860100 Personal history of colon polyps, unspecified: Secondary | ICD-10-CM

## 2024-12-08 HISTORY — PX: SAVORY DILATION: SHX5439

## 2024-12-08 HISTORY — PX: BIOPSY OF SKIN SUBCUTANEOUS TISSUE AND/OR MUCOUS MEMBRANE: SHX6741

## 2024-12-08 HISTORY — PX: ESOPHAGOGASTRODUODENOSCOPY: SHX5428

## 2024-12-08 HISTORY — PX: COLONOSCOPY: SHX5424

## 2024-12-08 LAB — GLUCOSE, CAPILLARY: Glucose-Capillary: 90 mg/dL (ref 70–99)

## 2024-12-08 MED ORDER — PROPOFOL 500 MG/50ML IV EMUL
INTRAVENOUS | Status: DC | PRN
  Administered 2024-12-08: 125 ug/kg/min via INTRAVENOUS

## 2024-12-08 MED ORDER — SODIUM CHLORIDE 0.9 % IV SOLN
INTRAVENOUS | Status: DC
  Administered 2024-12-08: 500 mL via INTRAVENOUS

## 2024-12-08 MED ORDER — LIDOCAINE 2% (20 MG/ML) 5 ML SYRINGE
INTRAMUSCULAR | Status: DC | PRN
  Administered 2024-12-08: 100 mg via INTRAVENOUS

## 2024-12-08 MED ORDER — PROPOFOL 10 MG/ML IV BOLUS
INTRAVENOUS | Status: DC | PRN
  Administered 2024-12-08: 150 mg via INTRAVENOUS

## 2024-12-08 NOTE — Interval H&P Note (Signed)
 History and Physical Interval Note:  12/08/2024 8:44 AM  Cynthia [name]  has presented today for surgery, with the diagnosis of dysphagia, history of colon polyps, GERD.  The various methods of treatment have been discussed with the patient and family. After consideration of risks, benefits and other options for treatment, the patient has consented to  Procedures: EGD (ESOPHAGOGASTRODUODENOSCOPY) (N/A) COLONOSCOPY (N/A) as a surgical intervention.  The patient's history has been reviewed, patient examined, no change in status, stable for surgery.  I have reviewed the patient's chart and labs.  Questions were answered to the patient's satisfaction.     Sandor GAILS [name]

## 2024-12-08 NOTE — Anesthesia Postprocedure Evaluation (Signed)
"   Anesthesia Post Note  Patient: Cynthia [name]  Procedure(s) Performed: EGD (ESOPHAGOGASTRODUODENOSCOPY) COLONOSCOPY EGD, WITH DILATION USING SAVARY-GILLIARD DILATOR OVER GUIDEWIRE     Patient location during evaluation: PACU Anesthesia Type: MAC Level of consciousness: awake and alert Pain management: pain level controlled Vital Signs Assessment: post-procedure vital signs reviewed and stable Respiratory status: spontaneous breathing, nonlabored ventilation, respiratory function stable and patient connected to nasal cannula oxygen Cardiovascular status: stable and blood pressure returned to baseline Postop Assessment: no apparent nausea or vomiting Anesthetic complications: no   No notable events documented.  Last Vitals:  Vitals:   12/08/24 1000 12/08/24 1020  BP: (!) 156/87   Pulse: 83 95  Resp: 16   Temp:    SpO2: 100%     Last Pain:  Vitals:   12/08/24 1000  TempSrc:   PainSc: 0-No pain                 Lynwood MARLA Cornea      "

## 2024-12-08 NOTE — H&P (Signed)
 "    GASTROENTEROLOGY PROCEDURE H&P NOTE   Primary Care Physician: Allwardt, Mardy HERO, PA-C    Reason for Procedure:  Dysphagia, nausea/vomiting, regurgitation, dyspepsia, colon cancer screening  Plan:    EGD, colonoscopy   Patient is appropriate for endoscopic procedure(s) at [facility] Endoscopy Unit.  The nature of the procedure, as well as the risks, benefits, and alternatives were carefully and thoroughly reviewed with the patient. Ample time for discussion and questions allowed. The patient understood, was satisfied, and agreed to proceed. I personally addressed all patient questions and concerns.     HPI: Cynthia [name] is a 44 y.o. female who presents for EGD for evaluation of intermittent solid food dysphagia, dyspepsia, regurgitation, and nausea/vomiting along with colonoscopy for CRC screening.  Due to elevated periprocedural risks from underlying comorbidities, procedure scheduled at [facility] Endoscopy unit.  Has been holding Mounjaro  for 7+ days for procedures today.  Past Medical History:  Diagnosis Date   Allergy 11/20   Anxiety    Arthritis    Asthma    childhood   Asthma    Bilateral swelling of feet    Constipation    Depression 10/22/21   Fatigue    Fibromyalgia 2007   High blood pressure    Hypertension    on and off since 2007   Joint pain    Multiple food allergies    Osteoarthritis    Osteoarthritis    Palpitations    Prediabetes    Seasonal allergies    SOB (shortness of breath)    Vitamin D  deficiency     History reviewed. No pertinent surgical history.  Prior to Admission medications  Medication Sig Start Date End Date Taking? Authorizing Provider  albuterol  (VENTOLIN  HFA) 108 (90 Base) MCG/ACT inhaler USE 2 INHALATIONS BY MOUTH  EVERY 4 HOURS AS NEEDED FOR WHEEZING OR SHORTNESS OF  BREATH 09/07/24   Allwardt, Alyssa M, PA-C  amLODipine  (NORVASC ) 5 MG tablet Take 1 tablet (5 mg total) by mouth daily. 06/24/24   Allwardt,  Mardy HERO, PA-C  cyanocobalamin  (VITAMIN B12) 500 MCG tablet Take 2 tablets (1,000 mcg total) by mouth daily. 12/16/23   Opalski, Barnie, DO  cyclobenzaprine  (FLEXERIL ) 5 MG tablet Take 1 tablet (5 mg total) by mouth 3 (three) times daily as needed for muscle spasms. 06/24/24   Allwardt, Alyssa M, PA-C  DULoxetine  (CYMBALTA ) 60 MG capsule Take 1 capsule (60 mg total) by mouth daily. 06/24/24   Allwardt, Alyssa M, PA-C  ferrous sulfate  325 (65 FE) MG tablet Take 1 tablet (325 mg total) by mouth every other day. 06/24/24   Allwardt, Alyssa M, PA-C  fluticasone -salmeterol (ADVAIR HFA) 115-21 MCG/ACT inhaler Inhale 2 puffs into the lungs 2 (two) times daily. Rinse mouth after use. 09/13/24   Allwardt, Alyssa M, PA-C  gabapentin  (NEURONTIN ) 300 MG capsule Take 1 capsule (300 mg total) by mouth 3 (three) times daily. 06/24/24   Allwardt, Alyssa M, PA-C  ibuprofen  (ADVIL ) 800 MG tablet Take 1 tablet (800 mg total) by mouth every 8 (eight) hours as needed. Take with food. 09/07/24   Allwardt, Alyssa M, PA-C  irbesartan -hydrochlorothiazide  (AVALIDE) 150-12.5 MG tablet 1 TAB PO Q AM 04/06/24   Danford, Katy D, NP  meloxicam  (MOBIC ) 15 MG tablet Take 1 tablet (15 mg total) by mouth daily. 10/26/24   Joya Stabs, DPM  metFORMIN  (GLUCOPHAGE ) 500 MG tablet 2 po with lunch and 2 dinner daily 11/10/24   Danford, Rockie BIRCH, NP  metoprolol  succinate (  TOPROL -XL) 25 MG 24 hr tablet Take 1 tablet by mouth once daily 07/21/24   Allwardt, Alyssa M, PA-C  polyethylene glycol powder (GLYCOLAX /MIRALAX ) 17 GM/SCOOP powder 17GR TWICE DAILY until stooling regularly, then qd 06/13/23   Opalski, Barnie, DO  Sennosides 8.6 MG CAPS     [provider]  SUMAtriptan  (IMITREX ) 50 MG tablet Take 1 tab (50mg ) orally at the onset of her migraine, may repeat in 2 hours if migraine persists.  Total daily maximum dose is 200 mg 06/24/24   Allwardt, Alyssa M, PA-C  tirzepatide  (MOUNJARO ) 2.5 MG/0.5ML Pen Inject 2.5 mg into the skin once a week.  11/10/24   Danford, Rockie D, NP  tirzepatide  (ZEPBOUND ) 2.5 MG/0.5ML injection vial Inject 2.5 mg into the skin once a week. 11/10/24   Danford, Katy D, NP  topiramate  (TOPAMAX ) 100 MG tablet TAKE 1 & 1/2 (ONE & ONE-HALF) TABLETS BY MOUTH TWICE DAILY FOR MIGRAINE PREVENTION 06/24/24   Allwardt, Alyssa M, PA-C  traZODone  (DESYREL ) 100 MG tablet Take 1 tablet (100 mg total) by mouth at bedtime as needed. for sleep 06/24/24   Allwardt, Alyssa M, PA-C  Vitamin D , Ergocalciferol , (DRISDOL ) 1.25 MG (50000 UNIT) CAPS capsule 1 po q Wed and 1 po q Sun 11/10/24   Danford, Katy D, NP    No current facility-administered medications for this encounter.    Allergies as of 10/29/2024 - Review Complete 10/29/2024  Allergen Reaction Noted   Prochlorperazine  Anxiety and Itching 06/19/2022   Bovine (beef) protein-containing drug products Other (See Comments) 12/23/2023   Porcine (pork) protein-containing drug products Other (See Comments) 12/23/2023   Justicia adhatoda Hives and Other (See Comments) 03/12/2023   Shellfish allergy Rash 10/02/2020    Family History  Problem Relation Age of Onset   Arthritis Mother    Hypertension Mother    Obesity Mother    Heart disease Father    Hyperlipidemia Father    Alcohol abuse Father    Arthritis Father    Asthma Father    COPD Father    Hypertension Father    Heart failure Father    Liver disease Father    Alcoholism Father    Breast cancer Maternal Aunt    Breast cancer Maternal Aunt    Breast cancer Paternal Aunt    Breast cancer Paternal Aunt    Breast cancer Maternal Grandmother    Early death Maternal Grandmother    Hypertension Maternal Grandmother    Pneumonia Maternal Grandmother    Hearing loss Maternal Grandfather    Hypertension Maternal Grandfather    Stroke Maternal Grandfather    Vision loss Maternal Grandfather    Hypertension Paternal Grandmother    Heart disease Paternal Grandmother    Hypertension Paternal Grandfather    Heart  failure Paternal Grandfather    Vision loss Paternal Grandfather    Breast cancer Cousin    Breast cancer Cousin    Breast cancer Cousin     Social History   Socioeconomic History   Marital status: Media Planner    Spouse name: Not on file   Number of children: Not on file   Years of education: Not on file   Highest education level: Associate degree: academic program  Occupational History   Occupation: caterer/event planning  Tobacco Use   Smoking status: Never   Smokeless tobacco: Never  Vaping Use   Vaping status: Never Used  Substance and Sexual Activity   Alcohol use: No   Drug use: No   Sexual  activity: Yes    Birth control/protection: Abstinence, None  Other Topics Concern   Not on file  Social History Narrative   Not on file   Social Drivers of Health   Tobacco Use: Low Risk (11/23/2024)   Patient History    Smoking Tobacco Use: Never    Smokeless Tobacco Use: Never    Passive Exposure: Not on file  Financial Resource Strain: Low Risk (06/22/2024)   Overall Financial Resource Strain (CARDIA)    Difficulty of Paying Living Expenses: Not hard at all  Food Insecurity: Food Insecurity Present (06/22/2024)   Epic    Worried About Programme Researcher, Broadcasting/film/video in the Last Year: Sometimes true    Ran Out of Food in the Last Year: Never true  Transportation Needs: No Transportation Needs (06/22/2024)   Epic    Lack of Transportation (Medical): No    Lack of Transportation (Non-Medical): No  Physical Activity: Insufficiently Active (06/22/2024)   Exercise Vital Sign    Days of Exercise per Week: 2 days    Minutes of Exercise per Session: 40 min  Stress: Stress Concern Present (06/22/2024)   [employer] of Occupational Health - Occupational Stress Questionnaire    Feeling of Stress: To some extent  Social Connections: Moderately Integrated (06/22/2024)   Social Connection and Isolation Panel    Frequency of Communication with Friends and Family: More than three times a week     Frequency of Social Gatherings with Friends and Family: Patient declined    Attends Religious Services: 1 to 4 times per year    Active Member of [employer] or Organizations: No    Attends Engineer, Structural: Not on file    Marital Status: Living with partner  Intimate Partner Violence: Not on file  Depression (PHQ2-9): Medium Risk (06/22/2024)   Depression (PHQ2-9)    PHQ-2 Score: 5  Alcohol Screen: Not on file  Housing: Low Risk (06/22/2024)   Epic    Unable to Pay for Housing in the Last Year: No    Number of Times Moved in the Last Year: 0    Homeless in the Last Year: No  Utilities: Not on file  Health Literacy: Not on file    Physical Exam: Vital signs in last 24 hours: @There  were no vitals taken for this visit. GEN: NAD EYE: Sclerae anicteric ENT: MMM CV: Non-tachycardic Pulm: CTA b/l GI: Soft, NT/ND NEURO:  Alert & Oriented x 3   Sandor Flatter, DO [facility]   12/08/2024 8:12 AM  "

## 2024-12-08 NOTE — Telephone Encounter (Signed)
Ok to refill for patient? ?

## 2024-12-08 NOTE — Op Note (Signed)
 [facility] Patient Name: Cynthia [name] Procedure Date: 12/08/2024 MRN: [id number] Attending MD: Sandor Flatter , MD, [id number] Date of Birth: October 29, 1981 CSN: [id number] Age: 44 Admit Type: Outpatient Procedure:                Colonoscopy Indications:              Screening for colorectal malignant neoplasm Providers:                Sandor Flatter, MD, Heather Ng, RN, [facility]                            Pettiford, Technician, Penne Gleason, CRNA Referring MD:              Medicines:                Monitored Anesthesia Care Complications:            No immediate complications. Estimated Blood Loss:     Estimated blood loss: none. Procedure:                Pre-Anesthesia Assessment:                           - Prior to the procedure, a History and Physical                            was performed, and patient medications and                            allergies were reviewed. The patient's tolerance of                            previous anesthesia was also reviewed. The risks                            and benefits of the procedure and the sedation                            options and risks were discussed with the patient.                            All questions were answered, and informed consent                            was obtained. Prior Anticoagulants: The patient has                            taken no anticoagulant or antiplatelet agents. ASA                            Grade Assessment: III - A patient with severe                            systemic disease. After reviewing the risks and  benefits, the patient was deemed in satisfactory                            condition to undergo the procedure.                           After obtaining informed consent, the colonoscope                            was passed under direct vision. Throughout the                            procedure, the patient's blood pressure, pulse, and                             oxygen saturations were monitored continuously. The                            CF-HQ190L ([id number]) Olympus colonoscope was                            introduced through the anus and advanced to the the                            cecum, identified by appendiceal orifice and                            ileocecal valve. The colonoscopy was performed                            without difficulty. The patient tolerated the                            procedure well. Following copious irrigation and                            lavage, the quality of the bowel preparation was                            good. The ileocecal valve, appendiceal orifice, and                            rectum were photographed. Scope In: 9:23:03 AM Scope Out: 9:40:39 AM Scope Withdrawal Time: 0 hours 15 minutes 0 seconds  Total Procedure Duration: 0 hours 17 minutes 36 seconds  Findings:      Skin tags were found on perianal exam.      The entire colon appeared normal. The colon was lavaged and irrigated       with copious amounts of sterile water to achieve adequate visualization.      Anal papilla(e) were hypertrophied. Impression:               - Perianal skin tags found on perianal exam.                           -  The entire examined colon is normal.                           - Anal papilla(e) were hypertrophied.                           - No specimens collected. Moderate Sedation:      Not Applicable - Patient had care per Anesthesia. Recommendation:           - Patient has a contact number available for                            emergencies. The signs and symptoms of potential                            delayed complications were discussed with the                            patient. Return to normal activities tomorrow.                            Written discharge instructions were provided to the                            patient.                           - Resume previous  diet.                           - Continue present medications.                           - Await pathology results.                           - Repeat colonoscopy in 10 years for screening                            purposes.                           - Return to GI office PRN. Procedure Code(s):        --- Professional ---                           H9878, Colorectal cancer screening; colonoscopy on                            individual not meeting criteria for high risk Diagnosis Code(s):        --- Professional ---                           Z12.11, Encounter for screening for malignant                            neoplasm of colon  K62.89, Other specified diseases of anus and rectum                           K64.4, Residual hemorrhoidal skin tags CPT copyright 2022 American Medical Association. All rights reserved. The codes documented in this report are preliminary and upon coder review may  be revised to meet current compliance requirements. Sandor Flatter, MD 12/08/2024 10:03:59 AM Number of Addenda: 0

## 2024-12-08 NOTE — Discharge Instructions (Signed)

## 2024-12-08 NOTE — Transfer of Care (Signed)
 Immediate Anesthesia Transfer of Care Note  Patient: Cynthia [name]  Procedure(s) Performed: EGD (ESOPHAGOGASTRODUODENOSCOPY) COLONOSCOPY EGD, WITH DILATION USING SAVARY-GILLIARD DILATOR OVER GUIDEWIRE  Patient Location: Endoscopy Unit  Anesthesia Type:MAC  Level of Consciousness: drowsy and patient cooperative  Airway & Oxygen Therapy: Patient Spontanous Breathing  Post-op Assessment: Report given to RN and Post -op Vital signs reviewed and stable  Post vital signs: Reviewed and stable  Last Vitals:  Vitals Value Taken Time  BP    Temp    Pulse    Resp    SpO2      Last Pain:  Vitals:   12/08/24 0834  TempSrc: Temporal  PainSc: 0-No pain         Complications: No notable events documented.

## 2024-12-08 NOTE — Op Note (Signed)
 [facility] Patient Name: Cynthia [name] Procedure Date: 12/08/2024 MRN: [id number] Attending MD: Sandor Flatter , MD, [id number] Date of Birth: [birth year] CSN: [id number] Age: 44 Admit Type: Outpatient Procedure:                Upper GI endoscopy Indications:              Dysphagia, Dyspepsia, Nausea with vomiting,                            Regurgitation Providers:                Sandor Flatter, MD, Heather Ng, RN, [facility]                            Pettiford, Technician, Penne Gleason, CRNA Referring MD:              Medicines:                Monitored Anesthesia Care Complications:            No immediate complications. Estimated Blood Loss:     Estimated blood loss was minimal. Procedure:                Pre-Anesthesia Assessment:                           - Prior to the procedure, a History and Physical                            was performed, and patient medications and                            allergies were reviewed. The patient's tolerance of                            previous anesthesia was also reviewed. The risks                            and benefits of the procedure and the sedation                            options and risks were discussed with the patient.                            All questions were answered, and informed consent                            was obtained. Prior Anticoagulants: The patient has                            taken no anticoagulant or antiplatelet agents. ASA                            Grade Assessment: III - A patient with severe  systemic disease. After reviewing the risks and                            benefits, the patient was deemed in satisfactory                            condition to undergo the procedure.                           After obtaining informed consent, the endoscope was                            passed under direct vision. Throughout the                             procedure, the patient's blood pressure, pulse, and                            oxygen saturations were monitored continuously. The                            [id number]-[id number] ([id number]) Olympus endoscope was introduced                            through the mouth, and advanced to the second part                            of duodenum. The upper GI endoscopy was                            accomplished without difficulty. The patient                            tolerated the procedure well. Scope In: Scope Out: Findings:      The examined esophagus was normal. A guidewire was placed and the scope       was withdrawn. Dilation was performed with a Savary dilator with no       resistance at 17 mm. The dilation site was examined following endoscope       reinsertion and showed no bleeding, mucosal tear or perforation.       Estimated blood loss: none. Biopsies were taken with a cold forceps for       histology. Estimated blood loss was minimal.      The Z-line was regular and was found 40 cm from the incisors.      The gastroesophageal flap valve was visualized endoscopically and       classified as Hill Grade III (minimal fold, loose to endoscope, hiatal       hernia likely).      The entire examined stomach was normal. Biopsies were taken with a cold       forceps for Helicobacter pylori testing. Estimated blood loss was       minimal.      The examined duodenum was normal. Biopsies were taken with a cold       forceps for histology. Estimated blood loss was minimal. Impression:               -  Normal esophagus. Dilated with 17 mm Savary                            dilator. Biopsies were obtained from the distal and                            proximal esophagus to rule out Eosinophilic                            Esophagitis (EoE).                           - Z-line regular, 40 cm from the incisors.                           - Gastroesophageal flap valve classified as Hill                             Grade III (minimal fold, loose to endoscope, hiatal                            hernia likely).                           - Normal stomach. Biopsied.                           - Normal examined duodenum. Biopsied. Moderate Sedation:      Not Applicable - Patient had care per Anesthesia. Recommendation:           - Patient has a contact number available for                            emergencies. The signs and symptoms of potential                            delayed complications were discussed with the                            patient. Return to normal activities tomorrow.                            Written discharge instructions were provided to the                            patient.                           - Resume previous diet.                           - Continue present medications.                           - Await pathology results. Procedure Code(s):        --- Professional ---  [phone], Esophagogastroduodenoscopy, flexible,                            transoral; with insertion of guide wire followed by                            passage of dilator(s) through esophagus over guide                            wire                           [id number], 59, Esophagogastroduodenoscopy, flexible,                            transoral; with biopsy, single or multiple Diagnosis Code(s):        --- Professional ---                           R13.10, Dysphagia, unspecified                           R10.13, Epigastric pain                           R11.2, Nausea with vomiting, unspecified                           R11.10, Vomiting, unspecified CPT copyright 2022 American Medical Association. All rights reserved. The codes documented in this report are preliminary and upon coder review may  be revised to meet current compliance requirements. Sandor Flatter, MD 12/08/2024 9:59:56 AM Number of Addenda: 0

## 2024-12-08 NOTE — Anesthesia Preprocedure Evaluation (Signed)
"                                    Anesthesia Evaluation  Patient identified by MRN, date of birth, ID band Patient awake    Reviewed: Allergy & Precautions, NPO status , Patient's Chart, lab work & pertinent test results, reviewed documented beta blocker date and time   History of Anesthesia Complications Negative for: history of anesthetic complications  Airway Mallampati: III  TM Distance: >3 FB     Dental  (+) Edentulous Upper   Pulmonary neg shortness of breath, asthma , neg COPD, neg recent URI   breath sounds clear to auscultation       Cardiovascular hypertension, (-) CAD, (-) Past MI, (-) Cardiac Stents and (-) CABG  Rhythm:Regular Rate:Normal     Neuro/Psych  Headaches, neg Seizures PSYCHIATRIC DISORDERS Anxiety Depression     Neuromuscular disease    GI/Hepatic ,,,(+) neg Cirrhosis        Endo/Other    Class 4 obesity  Renal/GU Renal disease     Musculoskeletal  (+) Arthritis , Osteoarthritis,  Fibromyalgia -  Abdominal  (+) + obese  Peds  Hematology  (+) Blood dyscrasia, anemia   Anesthesia Other Findings   Reproductive/Obstetrics                              Anesthesia Physical Anesthesia Plan  ASA: 3  Anesthesia Plan: MAC   Post-op Pain Management:    Induction: Intravenous  PONV Risk Score and Plan: 2 and Propofol  infusion  Airway Management Planned: Natural Airway and Nasal Cannula  Additional Equipment:   Intra-op Plan:   Post-operative Plan:   Informed Consent: I have reviewed the patients History and Physical, chart, labs and discussed the procedure including the risks, benefits and alternatives for the proposed anesthesia with the patient or authorized representative who has indicated his/her understanding and acceptance.     Dental advisory given  Plan Discussed with: CRNA  Anesthesia Plan Comments:         Anesthesia Quick Evaluation  "

## 2024-12-09 LAB — SURGICAL PATHOLOGY

## 2024-12-10 ENCOUNTER — Encounter (HOSPITAL_COMMUNITY): Payer: Self-pay | Admitting: Gastroenterology

## 2024-12-14 ENCOUNTER — Ambulatory Visit (INDEPENDENT_AMBULATORY_CARE_PROVIDER_SITE_OTHER): Admitting: Family Medicine

## 2024-12-14 ENCOUNTER — Ambulatory Visit: Admitting: Physician Assistant

## 2024-12-15 ENCOUNTER — Ambulatory Visit: Payer: Self-pay | Admitting: Gastroenterology

## 2024-12-15 ENCOUNTER — Other Ambulatory Visit (INDEPENDENT_AMBULATORY_CARE_PROVIDER_SITE_OTHER): Payer: Self-pay | Admitting: Adult Health

## 2024-12-15 DIAGNOSIS — Z6841 Body Mass Index (BMI) 40.0 and over, adult: Secondary | ICD-10-CM

## 2024-12-15 DIAGNOSIS — R7303 Prediabetes: Secondary | ICD-10-CM

## 2024-12-16 ENCOUNTER — Encounter (INDEPENDENT_AMBULATORY_CARE_PROVIDER_SITE_OTHER): Payer: Self-pay | Admitting: Family Medicine

## 2024-12-16 ENCOUNTER — Ambulatory Visit (INDEPENDENT_AMBULATORY_CARE_PROVIDER_SITE_OTHER): Admitting: Family Medicine

## 2024-12-16 DIAGNOSIS — E538 Deficiency of other specified B group vitamins: Secondary | ICD-10-CM

## 2024-12-16 DIAGNOSIS — K219 Gastro-esophageal reflux disease without esophagitis: Secondary | ICD-10-CM | POA: Diagnosis not present

## 2024-12-16 DIAGNOSIS — E65 Localized adiposity: Secondary | ICD-10-CM

## 2024-12-16 DIAGNOSIS — I10 Essential (primary) hypertension: Secondary | ICD-10-CM

## 2024-12-16 DIAGNOSIS — Z6841 Body Mass Index (BMI) 40.0 and over, adult: Secondary | ICD-10-CM | POA: Diagnosis not present

## 2024-12-16 DIAGNOSIS — E559 Vitamin D deficiency, unspecified: Secondary | ICD-10-CM

## 2024-12-16 DIAGNOSIS — R7303 Prediabetes: Secondary | ICD-10-CM | POA: Diagnosis not present

## 2024-12-16 MED ORDER — METFORMIN HCL 500 MG PO TABS: ORAL_TABLET | ORAL | 0 refills | Status: AC

## 2024-12-16 MED ORDER — TIRZEPATIDE 5 MG/0.5ML ~~LOC~~ SOAJ: 5.0000 mg | SUBCUTANEOUS | 0 refills | Status: AC

## 2024-12-16 NOTE — Progress Notes (Signed)
 "  [name], D.O.  ABFM, ABOM Specializing in Clinical Bariatric Medicine  Office located at: [address] [place], [place]  [address]        Orders Placed This Encounter  Procedures   VITAMIN D  25 Hydroxy (Vit-D Deficiency, Fractures)   Hemoglobin A1c   Insulin , random   Comprehensive metabolic panel with GFR   Vitamin B12    Medications Discontinued During This Encounter  Medication Reason   tirzepatide  (ZEPBOUND ) 2.5 MG/0.5ML injection vial    gabapentin  (NEURONTIN ) 300 MG capsule Discontinued by provider   metoprolol  succinate (TOPROL -XL) 25 MG 24 hr tablet Discontinued by provider   tirzepatide  (MOUNJARO ) 2.5 MG/0.5ML Pen    metFORMIN  (GLUCOPHAGE ) 500 MG tablet Reorder     Meds ordered this encounter  Medications   tirzepatide  (MOUNJARO ) 5 MG/0.5ML Pen    Sig: Inject 5 mg into the skin once a week.    Dispense:  2 mL    Refill:  0   metFORMIN  (GLUCOPHAGE ) 500 MG tablet    Sig: 2 po with lunch and 2 dinner daily    Dispense:  120 tablet    Refill:  0    ** OV for RF **   Do not send RF request      FOR THE CHRONIC DISEASE OF OBESITY:  Morbid obesity (HCC), start BMI 56.98 BMI 50.0-59.9, adult (HCC) Current BMI 50.22  Chief complaint: Obesity Cynthia [name] is here to discuss her progress with her obesity treatment plan.   History of present illness / Interval history:  [name] is here today for her follow-up office visit.  Since last OV on 11/10/2024 with provider: Jonel Pee D, NP, patient is down 10 lbs.   Recent challenges/ barriers to successful adherence of program recommendations:  Had to miss a shot of Mounjaro  because of an EGD by Dr.C of GI. Reports acid reflux, but unchanged from baseline. Overall, reports good adherence to MP and lifestyle interventions.  Pt has been compliant with meal plan and reports no concerns today --> excellent adherence to reduced calorie nutritional plan.  Pt has been struggling with:  []  meal planning  and prepping []  exercise []  sleep []  stressors []  mood []  chronic or acute medical conditions-  []  eating out more [x]  Nothing, doing great  Pt has been working on and improving their:  []  meal planning and prepping [x]  exercise []  sleep hygiene [x]  water intake []  strategies to better manage personal stressors []  strategies to better manage mood []  eating out less []  Nothing particular at this time  When asked by CMA prior to our office visit today, pt states they have been:  - Focused on eating fresh, unprocessed foods?  Yes   - Focused on eating lean proteins with each meal?  Yes; gets in about 120-140 grams of protein  - Sleeping 7-9 Hours/ Night?  Yes   - Skipping Meals?  No   - States she is following her healthy eating plan approximately 90 % of the time.   Recent weight loss data history  11/10/24 09:00 12/16/24 08:00   Body Fat % 55.8 % 54.1 %  Muscle Mass (lbs) 134.8 lbs 135.8 lbs  Fat Mass (lbs) 179.2 lbs 168.6 lbs  Total Body Water (lbs) 109.4 lbs 106.8 lbs  Visceral Fat Rating  20 19   Counseling done on how various foods/ behaviors will affect these numbers and how to maximize weight loss success discussed today in detail based on these findings.  Total lbs lost to date: -42 lbs Total Fat Mass in lbs lost to date: -29 lbs Total weight loss percentage to date since starting program:  -11.90 %    Physician directed Nutrition Therapy prescription: She is keeping a food journal and adhering to recommended goals of 1400-1500 calories and 100g+ protein.  Emanuella is currently in the action stage of change. As such, her goal is to continue weight management plan   She has agreed to: continue current reduced-calorie meal plan    Physician directed Behavioral Modification prescription: Evidence-based interventions for healthy behavior change were utilized today including the discussion of small changes and SMART goals.  We discussed the following today:  work on tracking and journaling calories using tracking application, practice mindfulness eating and understand the difference between hunger signals and cravings, continue to work on implementation of reduced calorie nutritional plan, and using GPT or another AI platform for recipe ideas- searching low calorie, low carb, high protein chicken recipes etc   Additional resources provided: Handout on Daily Food Journaling log   Physician directed Physical Activity prescription: Lissete is walking and doing water aerobics 30-60  minutes 3 days per week   Exercise prescription: She has agreed to Increase physical activity in their day and reduce sedentary time (increase NEAT)., Continue current level of physical activity , Increase the intensity, frequency or duration of aerobic exercises  , and Continue to gradually increase the amount and intensity of exercise routine    Medical Interventions/ Pharmacotherapy Previous Bariatric surgery: none  Pharmacotherapy for weight loss: She is currently taking Metformin , Mounjaro , and Topiramate  for medical weight loss.   Is currently taking Mounjaro  as insurance is covering the medication. Reports a little itch when she takes shot. She attributes this to possibly not cleaning the site properly.  The only time she noticed an issue was when she skipped a shot. Reports bad cramps and felt nausea when off the shot.  Educated pt that she even though it may be covered now, insurance may not in the future and thus it is important to adhere to nutritional meal plan and healthy lifestyle interventions.  Pt reports denies side effects. Because pt is doing well on the medication. Pt understands the risks and benefits of increase dose. Encouraged to cont adhering to MP to subside side effects.    We discussed various medication options to help Larayne with her weight loss efforts and we both agreed to:  Continue with current nutritional and behavioral  strategies  SPECIFIC behavorial / nutritional / exercise goals for next office visit:  Cont current levels of exercise and water intake  B) OBESITY RELATED CONDITIONS ADDRESSED TODAY:  RECHECK A1C, FASTING INSULIN , VIT D, B12, and CMP, TODAY.   Prediabetes Assessment & Plan Lab Results  Component Value Date   HGBA1C 5.3 04/06/2024   HGBA1C 5.7 (H) 10/23/2023   HGBA1C 6.0 (H) 07/01/2023   INSULIN  15.3 04/06/2024   INSULIN  14.0 10/23/2023   INSULIN  13.7 07/01/2023  Pre-DM condition is unchanged. Currently on Metformin  twice daily with good compliance and tolerance. Hunger and cravings well controlled. No concerns reported. Cont meds on current dose; discussed decreasing dose in the future. Cont adherence to prudent nutritional meal plan. Increase exercise as able. Recheck A1c and fasting insulin  today.     Gastroesophageal reflux disease, unspecified whether esophagitis present Assessment & Plan Recently had an EGD from Dr. JAYSON of GI. Reports findings were normal and no reported acute concerns. Reports acid reflux when off Mounjaro ,  but other than that GERD has been overall well controlled. Denies any GI upset. Cont to adhere to MP and lifestyle interventions. Cont to follow up with GI as needed.     Primary hypertension Assessment & Plan Last 3 blood pressure readings in our office are as follows: BP Readings from Last 3 Encounters:  12/16/24 125/85  12/08/24 (!) 156/87  11/10/24 136/83   The ASCVD Risk score (Arnett DK, et al., 2019) failed to calculate for the following reasons:   The valid total cholesterol range is 130 to 320 mg/dL  Lab Results  Component Value Date   CREATININE 0.84 06/22/2024  Currently on Norvasc  and Avalide with good tolerance and compliance. BP is overall well controlled today at 125/85; optimal <120/80. Pt asx. No acute concerns. Cont adherence to low-sodium, heart-healthy diet. Cont meds per PCP. Cont to f/u with PCP as needed. Recheck CMP today.      Visceral obesity Assessment & Plan Current visceral fat rating: 19.  The visceral fat rating should be < 12 in a female.  Visceral adipose tissue is a hormonally active component of total body fat. This body composition phenotype is associated with medical disorders such as metabolic syndrome, cardiovascular disease and several malignancies including prostate, breast, and colorectal cancers. Cont losing 7-10% of weight via prudent nutritional plan and lifestyle changes.     Vitamin B12 deficiency Assessment & Plan - B12 level is 377, not at goal of over 500.  This diagnosis was reviewed with the patient and education was provided.  Lab Results  Component Value Date   VITAMINB12 377 04/06/2024  - Currently on OTC B12; cont regimen - Continue prudent nutritional plan and focus on b12 rich foods such as lean red meats; poultry; eggs; seafood; beans, peas, and lentils; nuts and seeds; and soy products - We will continue to monitor as deemed clinically necessary. - Counseling provided today Recheck levels today   Vitamin D  deficiency Assessment & Plan Lab Results  Component Value Date   VD25OH 51.3 04/06/2024   VD25OH 52.5 10/23/2023   VD25OH 57.1 07/01/2023  Currently on Ergo twice a week with good compliance and tolerance. Reports no acute concerns. Cont regimen. Recheck levels today.     {do_course:29402}  {do_assess_HPI:34549}  {do_plan_txmnt plan_orders:34550}   Try to see is this is helpful {do_medical_conditions_A/P:34586}   Follow up:   Return in about 3 weeks (around 01/06/2025).   She was informed of the importance of frequent follow up visits to maximize her success with intensive lifestyle modifications for her multiple health conditions.  Labs obtained today and will be discussed/ addressed at their next office visit unless critical medical findings arise, then we will contact via Mychart or phone call.   Weight Summary and Biometrics   Weight Lost  Since Last Visit: 10lb  Weight Gained Since Last Visit: 0lb    Vitals Temp: 97.6 F (36.4 C) BP: 125/85 Pulse Rate: 87 SpO2: 97 %   Anthropometric Measurements Height: 5' 6 (1.676 m) Weight: (!) 311 lb (141.1 kg) BMI (Calculated): 50.22 Weight at Last Visit: 321lb Weight Lost Since Last Visit: 10lb Weight Gained Since Last Visit: 0lb Starting Weight: 353lb Total Weight Loss (lbs): 42 lb (19.1 kg)   Body Composition  Body Fat %: 54.1 % Fat Mass (lbs): 168.6 lbs Muscle Mass (lbs): 135.8 lbs Total Body Water (lbs): 106.8 lbs Visceral Fat Rating : 19   Other Clinical Data Labs: Yes Today's Visit #: 19 Starting Date: 03/12/23    Objective:  PHYSICAL EXAM: Blood pressure 125/85, pulse 87, temperature 97.6 F (36.4 C), height 5' 6 (1.676 m), weight (!) 311 lb (141.1 kg), last menstrual period 12/05/2024, SpO2 97%. Body mass index is 50.2 kg/m. General: she is overweight, cooperative and in no acute distress. PSYCH: Has normal mood, affect and thought process.   HEENT: EOMI, sclerae are anicteric. Lungs: Normal breathing effort, no conversational dyspnea. Extremities: Moves * 4 Neurologic: A and O * 3, good insight  DIAGNOSTIC DATA REVIEWED: BMET    Component Value Date/Time   NA 136 06/22/2024 1355   NA 137 04/06/2024 0937   K 4.2 06/22/2024 1355   CL 103 06/22/2024 1355   CO2 26 06/22/2024 1355   GLUCOSE 84 06/22/2024 1355   BUN 15 06/22/2024 1355   BUN 18 04/06/2024 0937   CREATININE 0.84 06/22/2024 1355   CREATININE 0.72 11/30/2016 0926   CALCIUM 8.8 06/22/2024 1355   GFRNONAA >60 06/19/2022 0535   GFRNONAA >89 11/30/2016 0926   GFRAA 96 12/08/2018 1624   GFRAA >89 11/30/2016 0926   Lab Results  Component Value Date   HGBA1C 5.3 04/06/2024   HGBA1C 5.5 01/13/2014   Lab Results  Component Value Date   INSULIN  15.3 04/06/2024   INSULIN  12.4 03/12/2023   Lab Results  Component Value Date   TSH 1.58 06/22/2024   CBC    Component  Value Date/Time   WBC 5.1 06/22/2024 1355   RBC 4.58 06/22/2024 1355   HGB 13.9 06/22/2024 1355   HGB 12.7 03/12/2023 1033   HCT 42.1 06/22/2024 1355   HCT 39.1 03/12/2023 1033   PLT 256.0 06/22/2024 1355   PLT 334 03/12/2023 1033   MCV 92.0 06/22/2024 1355   MCV 83 03/12/2023 1033   MCH 26.9 03/12/2023 1033   MCH 26.3 06/19/2022 0535   MCHC 33.0 06/22/2024 1355   RDW 13.4 06/22/2024 1355   RDW 14.5 03/12/2023 1033   Iron Studies    Component Value Date/Time   IRON 44 12/19/2022 0936   TIBC 372 12/19/2022 0936   FERRITIN 5 (L) 12/19/2022 0936   IRONPCTSAT 12 (L) 12/19/2022 0936   Lipid Panel     Component Value Date/Time   CHOL 127 06/22/2024 1355   CHOL 123 03/12/2023 1033   TRIG 50.0 06/22/2024 1355   HDL 48.50 06/22/2024 1355   HDL 50 03/12/2023 1033   CHOLHDL 3 06/22/2024 1355   VLDL 10.0 06/22/2024 1355   LDLCALC 69 06/22/2024 1355   LDLCALC 62 03/12/2023 1033   Hepatic Function Panel     Component Value Date/Time   PROT 7.6 06/22/2024 1355   PROT 7.4 04/06/2024 0937   ALBUMIN 3.8 06/22/2024 1355   ALBUMIN 3.8 (L) 04/06/2024 0937   AST 14 06/22/2024 1355   ALT 14 06/22/2024 1355   ALKPHOS 69 06/22/2024 1355   BILITOT 0.3 06/22/2024 1355   BILITOT 0.2 04/06/2024 0937      Component Value Date/Time   TSH 1.58 06/22/2024 1355   Nutritional Lab Results  Component Value Date   VD25OH 51.3 04/06/2024   VD25OH 52.5 10/23/2023   VD25OH 57.1 07/01/2023    Attestations:   I, ***, acting as a stage manager for Barnie Jenkins, DO., have compiled all relevant documentation for today's office visit on behalf of Barnie Jenkins, DO, while in the presence of [employer], DO.  Only keep if time based coding I have spent *** minutes in the care of the patient including:   -   ***  minutes before the visit reviewing and preparing the chart.   -   *** minutes face-to-face assessing and reviewing listed medical problems as outlined in obesity care plan,  providing nutritional and behavioral counseling on topics outlined in the obesity care plan, independently interpreting test results and goals of care as described in assessment and plan, and reviewing and discussing biometric information and progress with obesity treatment plan  -   *** minutes after the visit regarding the EMR documentation of encounter.   I have reviewed the above documentation for accuracy and completeness, and I agree with the above. Barnie JINNY Jenkins, D.O.  The 21st Century Cures Act was signed into law in 2016 which includes the topic of electronic health records.  This provides immediate access to information in MyChart.  This includes consultation notes, operative notes, office notes, lab results and pathology reports.  If you have any questions about what you read please let us  know at your next visit so we can discuss your concerns and take corrective action if need be.  We are right here with you.  "

## 2024-12-17 LAB — COMPREHENSIVE METABOLIC PANEL WITH GFR
ALT: 14 [IU]/L (ref 0–32)
AST: 13 [IU]/L (ref 0–40)
Albumin: 4 g/dL (ref 3.9–4.9)
Alkaline Phosphatase: 75 [IU]/L (ref 41–116)
BUN/Creatinine Ratio: 12 (ref 9–23)
BUN: 11 mg/dL (ref 6–24)
Bilirubin Total: 0.4 mg/dL (ref 0.0–1.2)
CO2: 21 mmol/L (ref 20–29)
Calcium: 9.1 mg/dL (ref 8.7–10.2)
Chloride: 105 mmol/L (ref 96–106)
Creatinine, Ser: 0.94 mg/dL (ref 0.57–1.00)
Globulin, Total: 2.8 g/dL (ref 1.5–4.5)
Glucose: 79 mg/dL (ref 70–99)
Potassium: 4.4 mmol/L (ref 3.5–5.2)
Sodium: 139 mmol/L (ref 134–144)
Total Protein: 6.8 g/dL (ref 6.0–8.5)
eGFR: 76 mL/min/{1.73_m2}

## 2024-12-17 LAB — HEMOGLOBIN A1C
Est. average glucose Bld gHb Est-mCnc: 103 mg/dL
Hgb A1c MFr Bld: 5.2 % (ref 4.8–5.6)

## 2024-12-17 LAB — INSULIN, RANDOM: INSULIN: 9.4 u[IU]/mL (ref 2.6–24.9)

## 2024-12-17 LAB — VITAMIN D 25 HYDROXY (VIT D DEFICIENCY, FRACTURES): Vit D, 25-Hydroxy: 60.1 ng/mL (ref 30.0–100.0)

## 2024-12-17 LAB — VITAMIN B12: Vitamin B-12: 347 pg/mL (ref 232–1245)

## 2024-12-22 ENCOUNTER — Ambulatory Visit (INDEPENDENT_AMBULATORY_CARE_PROVIDER_SITE_OTHER): Admitting: Family Medicine

## 2025-01-11 ENCOUNTER — Ambulatory Visit (INDEPENDENT_AMBULATORY_CARE_PROVIDER_SITE_OTHER): Admitting: Family Medicine

## 2025-02-01 ENCOUNTER — Ambulatory Visit (INDEPENDENT_AMBULATORY_CARE_PROVIDER_SITE_OTHER): Admitting: Family Medicine

## 2025-06-28 ENCOUNTER — Encounter: Admitting: Physician Assistant
